# Patient Record
Sex: Male | Born: 1975 | Race: White | Hispanic: No | Marital: Married | Smoking: Never smoker
Health system: Southern US, Community
[De-identification: ages and names within clinical notes are randomized; demographics above are authoritative.]

## PROBLEM LIST (undated history)

## (undated) DIAGNOSIS — E039 Hypothyroidism, unspecified: Secondary | ICD-10-CM

## (undated) DIAGNOSIS — Z9483 Pancreas transplant status: Secondary | ICD-10-CM

## (undated) DIAGNOSIS — E079 Disorder of thyroid, unspecified: Secondary | ICD-10-CM

## (undated) DIAGNOSIS — K219 Gastro-esophageal reflux disease without esophagitis: Secondary | ICD-10-CM

## (undated) DIAGNOSIS — F32A Depression, unspecified: Secondary | ICD-10-CM

## (undated) DIAGNOSIS — Z940 Kidney transplant status: Secondary | ICD-10-CM

## (undated) DIAGNOSIS — F329 Major depressive disorder, single episode, unspecified: Secondary | ICD-10-CM

## (undated) DIAGNOSIS — F419 Anxiety disorder, unspecified: Secondary | ICD-10-CM

## (undated) HISTORY — PX: EYE SURGERY: SHX253

## (undated) HISTORY — DX: Depression, unspecified: F32.A

## (undated) HISTORY — DX: Anxiety disorder, unspecified: F41.9

## (undated) HISTORY — DX: Hypothyroidism, unspecified: E03.9

## (undated) HISTORY — DX: Gastro-esophageal reflux disease without esophagitis: K21.9

## (undated) HISTORY — DX: Major depressive disorder, single episode, unspecified: F32.9

---

## 1997-08-12 ENCOUNTER — Other Ambulatory Visit: Admission: RE | Admit: 1997-08-12 | Discharge: 1997-08-12 | Payer: Self-pay | Admitting: Nephrology

## 1997-09-12 ENCOUNTER — Ambulatory Visit: Admission: RE | Admit: 1997-09-12 | Discharge: 1997-09-12 | Payer: Self-pay | Admitting: Vascular Surgery

## 1997-09-18 ENCOUNTER — Other Ambulatory Visit: Admission: RE | Admit: 1997-09-18 | Discharge: 1997-09-18 | Payer: Self-pay | Admitting: Nephrology

## 1997-09-25 ENCOUNTER — Inpatient Hospital Stay (HOSPITAL_COMMUNITY): Admission: RE | Admit: 1997-09-25 | Discharge: 1997-09-29 | Payer: Self-pay | Admitting: Nephrology

## 1998-08-26 ENCOUNTER — Encounter: Admission: RE | Admit: 1998-08-26 | Discharge: 1998-11-23 | Payer: Self-pay | Admitting: Endocrinology

## 1998-09-06 ENCOUNTER — Inpatient Hospital Stay (HOSPITAL_COMMUNITY): Admission: AD | Admit: 1998-09-06 | Discharge: 1998-09-09 | Payer: Self-pay | Admitting: Nephrology

## 1998-09-13 ENCOUNTER — Emergency Department (HOSPITAL_COMMUNITY): Admission: EM | Admit: 1998-09-13 | Discharge: 1998-09-13 | Payer: Self-pay | Admitting: Emergency Medicine

## 1999-10-04 ENCOUNTER — Encounter: Admission: RE | Admit: 1999-10-04 | Discharge: 1999-10-04 | Payer: Self-pay | Admitting: Nephrology

## 1999-10-04 ENCOUNTER — Encounter: Payer: Self-pay | Admitting: Nephrology

## 2000-01-14 ENCOUNTER — Encounter: Payer: Self-pay | Admitting: Nephrology

## 2000-01-14 ENCOUNTER — Encounter: Admission: RE | Admit: 2000-01-14 | Discharge: 2000-01-14 | Payer: Self-pay | Admitting: Nephrology

## 2000-04-24 ENCOUNTER — Encounter: Payer: Self-pay | Admitting: Nephrology

## 2000-04-24 ENCOUNTER — Encounter: Admission: RE | Admit: 2000-04-24 | Discharge: 2000-04-24 | Payer: Self-pay | Admitting: Nephrology

## 2000-05-09 ENCOUNTER — Ambulatory Visit (HOSPITAL_COMMUNITY): Admission: RE | Admit: 2000-05-09 | Discharge: 2000-05-09 | Payer: Self-pay | Admitting: Urology

## 2000-06-22 ENCOUNTER — Encounter: Payer: Self-pay | Admitting: Nephrology

## 2000-06-22 ENCOUNTER — Encounter: Admission: RE | Admit: 2000-06-22 | Discharge: 2000-06-22 | Payer: Self-pay | Admitting: Nephrology

## 2000-07-01 ENCOUNTER — Encounter: Payer: Self-pay | Admitting: Nephrology

## 2000-07-01 ENCOUNTER — Emergency Department (HOSPITAL_COMMUNITY): Admission: EM | Admit: 2000-07-01 | Discharge: 2000-07-01 | Payer: Self-pay | Admitting: Emergency Medicine

## 2000-08-23 ENCOUNTER — Ambulatory Visit (HOSPITAL_COMMUNITY): Admission: RE | Admit: 2000-08-23 | Discharge: 2000-08-23 | Payer: Self-pay | Admitting: Gastroenterology

## 2000-08-23 ENCOUNTER — Encounter: Payer: Self-pay | Admitting: Gastroenterology

## 2000-08-23 ENCOUNTER — Encounter: Admission: RE | Admit: 2000-08-23 | Discharge: 2000-08-23 | Payer: Self-pay | Admitting: Gastroenterology

## 2000-08-24 ENCOUNTER — Encounter: Payer: Self-pay | Admitting: Gastroenterology

## 2000-08-24 ENCOUNTER — Ambulatory Visit (HOSPITAL_COMMUNITY): Admission: RE | Admit: 2000-08-24 | Discharge: 2000-08-24 | Payer: Self-pay | Admitting: Gastroenterology

## 2000-09-02 ENCOUNTER — Emergency Department (HOSPITAL_COMMUNITY): Admission: EM | Admit: 2000-09-02 | Discharge: 2000-09-03 | Payer: Self-pay | Admitting: Emergency Medicine

## 2000-09-19 ENCOUNTER — Encounter: Admission: RE | Admit: 2000-09-19 | Discharge: 2000-09-19 | Payer: Self-pay | Admitting: *Deleted

## 2000-09-19 ENCOUNTER — Ambulatory Visit (HOSPITAL_COMMUNITY): Admission: RE | Admit: 2000-09-19 | Discharge: 2000-09-19 | Payer: Self-pay | Admitting: *Deleted

## 2000-12-08 ENCOUNTER — Encounter: Payer: Self-pay | Admitting: Nephrology

## 2000-12-08 ENCOUNTER — Ambulatory Visit (HOSPITAL_COMMUNITY): Admission: RE | Admit: 2000-12-08 | Discharge: 2000-12-08 | Payer: Self-pay | Admitting: Nephrology

## 2001-11-19 ENCOUNTER — Encounter: Admission: RE | Admit: 2001-11-19 | Discharge: 2001-11-19 | Payer: Self-pay | Admitting: Nephrology

## 2001-11-19 ENCOUNTER — Encounter: Payer: Self-pay | Admitting: Nephrology

## 2002-03-07 ENCOUNTER — Inpatient Hospital Stay (HOSPITAL_COMMUNITY): Admission: EM | Admit: 2002-03-07 | Discharge: 2002-03-08 | Payer: Self-pay | Admitting: Emergency Medicine

## 2002-03-22 ENCOUNTER — Encounter: Payer: Self-pay | Admitting: Emergency Medicine

## 2002-03-22 ENCOUNTER — Emergency Department (HOSPITAL_COMMUNITY): Admission: EM | Admit: 2002-03-22 | Discharge: 2002-03-22 | Payer: Self-pay | Admitting: Emergency Medicine

## 2002-06-22 ENCOUNTER — Encounter: Payer: Self-pay | Admitting: Nephrology

## 2002-06-22 ENCOUNTER — Inpatient Hospital Stay (HOSPITAL_COMMUNITY): Admission: EM | Admit: 2002-06-22 | Discharge: 2002-06-23 | Payer: Self-pay | Admitting: Emergency Medicine

## 2002-07-02 ENCOUNTER — Encounter: Payer: Self-pay | Admitting: Nephrology

## 2002-07-02 ENCOUNTER — Encounter: Admission: RE | Admit: 2002-07-02 | Discharge: 2002-07-02 | Payer: Self-pay | Admitting: Nephrology

## 2003-09-17 ENCOUNTER — Ambulatory Visit (HOSPITAL_COMMUNITY): Admission: RE | Admit: 2003-09-17 | Discharge: 2003-09-17 | Payer: Self-pay | Admitting: Vascular Surgery

## 2004-03-30 ENCOUNTER — Encounter: Admission: RE | Admit: 2004-03-30 | Discharge: 2004-03-30 | Payer: Self-pay | Admitting: Nephrology

## 2004-08-12 ENCOUNTER — Encounter: Admission: RE | Admit: 2004-08-12 | Discharge: 2004-08-12 | Payer: Self-pay | Admitting: Nephrology

## 2004-08-18 ENCOUNTER — Ambulatory Visit: Payer: Self-pay

## 2005-04-04 ENCOUNTER — Inpatient Hospital Stay (HOSPITAL_COMMUNITY): Admission: EM | Admit: 2005-04-04 | Discharge: 2005-04-05 | Payer: Self-pay | Admitting: *Deleted

## 2006-06-22 ENCOUNTER — Encounter: Admission: RE | Admit: 2006-06-22 | Discharge: 2006-06-22 | Payer: Self-pay | Admitting: Nephrology

## 2007-06-05 ENCOUNTER — Ambulatory Visit (HOSPITAL_BASED_OUTPATIENT_CLINIC_OR_DEPARTMENT_OTHER): Admission: RE | Admit: 2007-06-05 | Discharge: 2007-06-05 | Payer: Self-pay | Admitting: Urology

## 2009-12-03 ENCOUNTER — Ambulatory Visit (HOSPITAL_COMMUNITY): Admission: RE | Admit: 2009-12-03 | Discharge: 2009-12-04 | Payer: Self-pay | Admitting: Surgery

## 2010-07-09 LAB — COMPREHENSIVE METABOLIC PANEL
Alkaline Phosphatase: 87 U/L (ref 39–117)
BUN: 16 mg/dL (ref 6–23)
CO2: 31 mEq/L (ref 19–32)
Chloride: 105 mEq/L (ref 96–112)
Creatinine, Ser: 1.2 mg/dL (ref 0.4–1.5)
GFR calc non Af Amer: >60 mL/min (ref >60)
Glucose, Bld: 82 mg/dL (ref 70–99)
Potassium: 4.1 mEq/L (ref 3.5–5.1)
Total Bilirubin: 0.6 mg/dL (ref 0.3–1.2)

## 2010-07-09 LAB — SURGICAL PCR SCREEN
MRSA, PCR: NEGATIVE
Staphylococcus aureus: POSITIVE — AB

## 2010-07-09 LAB — CBC
HCT: 45.2 % (ref 39.0–52.0)
MCH: 32.1 pg (ref 26.0–34.0)
MCV: 94 fL (ref 78.0–100.0)
RBC: 4.8 MIL/uL (ref 4.22–5.81)
WBC: 7.1 10*3/uL (ref 4.0–10.5)

## 2010-09-07 NOTE — Op Note (Signed)
NAME:  Daniel Hill, BAYUK               ACCOUNT NO.:  [id number]   MEDICAL RECORD NO.:  [id number]          PATIENT TYPE:  AMB   LOCATION:  N[facility]                         FACILITY:  [facility]   PHYSICIAN:  Marshall Cork. Jeffie Pollock, M.D.    DATE OF BIRTH:  [birth year]   DATE OF PROCEDURE:  06/05/2007  DATE OF DISCHARGE:                               OPERATIVE REPORT   PROCEDURE:  Left hydrocelectomy.   PREOPERATIVE DIAGNOSIS:  Left hydrocele.   POSTOPERATIVE DIAGNOSIS:  Left hydrocele.   SURGEON:  Dr. Irine Seal   ANESTHESIA:  General.   COMPLICATIONS:  None.   INDICATIONS:  [name] is a 35 year old white male with a symptomatic left  hydrocele who is to undergo hydrocelectomy.   FINDINGS AND PROCEDURE:  He was given a gram of Ancef because of his  history of renal and pancreatic transplant.  He was placed in supine  position.  General anesthetic was induced.  His scrotum was clipped.  He  was prepped with Betadine solution, draped in the usual sterile fashion.  Oblique scrotal incision was made over the hydrocele on the left.  The  hydrocele sac was delivered from the scrotum.  It was opened and  drained.  The sac was then imbricated behind the testicle in a water  bottle fashion using running locked 3-0 chromic suture.  The testicle  was then returned to the left hemi scrotum once hemostasis was assured.  The dartos was closed using a running 3-0 chromic, skin using a running  vertical mattress 3-0 chromic.  The incision was infiltrated 3 mL 0.25%  Marcaine.  A dressing of 4x4s, fluff Kerlix and scrotal support was  placed.  His anesthetic was reversed.  He was moved to the recovery room  in stable condition and there were no complications.      Marshall Cork. Jeffie Pollock, M.D.  Electronically Signed     JJW/MEDQ  D:  06/05/2007  T:  06/06/2007  Job:  [id number]   cc:   Jeneen Rinks L. Deterding, M.D.  Fax: [phone]

## 2010-09-10 NOTE — H&P (Signed)
NAME:  Daniel Hill, MARCHETTA NO.:  [id number]   MEDICAL RECORD NO.:  [id number]          PATIENT TYPE:  EMS   LOCATION:  MAJO                         FACILITY:  [facility]   PHYSICIAN:  James L. Deterding, M.D.DATE OF BIRTH:  Sep 13, 1975   DATE OF ADMISSION:  04/04/2005  DATE OF DISCHARGE:                                HISTORY & PHYSICAL   ADMISSION DIAGNOSIS:  Refractory nausea and vomiting.   HISTORY OF PRESENT ILLNESS:  This is a 35 year old gentleman followed by me  longterm for type 1 diabetes which has caused end-stage renal disease.  He  received a kidney-pancreas transplant in 2001 at [facility].  He has done quite  well from that.  He has had minor complications off and on with  hyperlipidemia, hypothyroidism, and upper respiratory infections, but no  serious complications.  Prior to his kidney transplant, he had had bilateral  nephrectomies for recurrent infections.  He had an upper respiratory  infection on March 29, 2005 and still has some, and he says it is a sinus  infection and is getting better with Claritin.  He had some nausea,  vomiting, and cough on March 30, 2005 but resolved after approximately 12  hours.  He had sudden onset of nausea and vomiting earlier this a.m. and  could not stop vomiting.  He had one loose stool this a.m. also.  He says he  ate fairly heavily last p.m. and cannot say that he may not have had food  poisoning.  He has not had fevers or chills but does shake he says.  He has  had no belly pain or cramping other than the vomiting.  This is a new event  for him this week.  He has had no medication intolerances or other problems  with that at the current time.   PAST MEDICAL HISTORY:  As listed above.   MEDICATIONS:  1.  Prograf 3 mg b.i.d.  2.  __________ 75 mg daily.  3.  Prednisone 5 mg daily.  4.  Synthroid 0.5 mg daily.  5.  Multivitamins once a day.  6.  Phenergan.  7.  Claritin.   ALLERGIES:  NO KNOWN DRUG ALLERGIES.   PAST SURGICAL HISTORY:  1.  Nephrectomies.  2.  Fistula which was taken down several years ago.  3.  Kidney-pancreas transplant.  4.  Multiple eye surgeries, and he is blind in his left eye.   REVIEW OF SYSTEMS:  HEENT:  He is blind in his left eye.  He has some  vitreous areas.  He has had laser surgery on the right and a vitrectomy on  that side.  CARDIOVASCULAR:  He has not been exercising.  He has not gained weight.  He  sleeps on one pillow.  No PND or orthopnea.  He denies ankle edema.  PULMONARY:  He has had a cough.  No sputum production.  Specifically, no  nasal drainage recently.  GI:  As listed above.  SKIN:  Negative.  MUSCULOSKELETAL:  Various aches and pains but nothing consistent.  NEUROLOGIC:  Other than his vision, no specific problems.  FAMILY HISTORY:  Noncontributory to the current illness.  He does have  family history of diabetes and high blood pressure.   SOCIAL HISTORY:  He lives in an apartment by himself.  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   PHYSICAL EXAMINATION:  GENERAL:  He is pale and obviously has been having  some nausea.  He is very sleepy from the medicine he has been given.  He is  in no acute distress.  VITAL SIGNS:  Blood pressure 148/68 supine, with a heart rate of 21; 124/71  standing, with a heart rate of 144.  HEENT:  Patent cornea and vitreous on the left.  Pharynx unremarkable.  Diabetic retinopathy on the right eye.  NECK:  He has shotty anterior and posterior cervical adenopathy in the neck.  No thyromegaly.  LUNGS:  Normal percussion, normal expansion, normal breath sounds.  CARDIOVASCULAR:  Regular rhythm.  S4, grade 2/6 systolic ejection murmur  ____________ fifth intercostal space.  EXTREMITIES:  No significant edema.  Pulses are 2+ and 4+.  No bruits.  ___________ thrills.  ABDOMEN:  Positive bowel sounds.  Soft, nontender.  No organomegaly.  He has  a transplant in the left lower quadrant large midline scar.   SKIN:  Some acne.  MUSCULOSKELETAL:  Unremarkable.  NEUROLOGIC:  Unremarkable.   IMAGING STUDIES:  X-rays reveal a nonspecific bowel-gas pattern.  Chest x-  ray unremarkable.   LABORATORY DATA:  Hemoglobin 15, white count 11,500, platelets 343,000.  Sodium 142, potassium 3.8, chloride 110, bicarb 29, creatinine 1.3, BUN 21,  glucose 111.  LFTs normal.  Bilirubin normal.  Lipase 21.   ASSESSMENT:  1.  Nausea, vomiting, either gastroenteritis versus food poisoning.  Mild      decrease in volume at this time.  Will treat his symptoms and volume at      this time and make sure he holds his medications down for this      transplant.  That is the biggest issue for him.  2.  Kidney-pancreas.  Give his medications.  __________ have to give him an      intravenously.  3.  Hypothyroidism.  Give his medication intravenously if he does not      tolerate it by mouth.  4.  Hyperlipidemia.  5.  Visual difficulty.   PLAN:  1.  IV fluids.  2.  Antisuppresives.  3.  Anti-nausea medicines.           ______________________________  Joyice Faster Deterding, M.D.     JLD/MEDQ  D:  04/04/2005  T:  04/04/2005  Job:  [id number]

## 2010-09-10 NOTE — Procedures (Signed)
[facility]  Patient:    Daniel Hill, Daniel Hill                      MRN: [id number] Proc. Date: 08/23/00 Adm. Date:  [id number] Attending:  Sherrin Daisy CC:         Joyice Faster. Deterding, M.D.   Procedure Report  PROCEDURE:  Esophagogastroduodenoscopy.  MEDICATIONS:  Hurricaine spray, fentanyl 62.5 mcg, Versed 6 mg IV.  INDICATIONS:  Persistent nausea and vomiting.  Ultrasound of the gallbladder done just before this procedure revealed no evidence of gallstones.  This is a man with end-stage renal disease on dialysis.  Specifically he is on ambulatory peritoneal dialysis.  INFORMED CONSENT:  The procedure had been explained to the patient and consent obtained.  DESCRIPTION OF PROCEDURE:  With the patient in the left lateral decubitus position, the Olympus video endoscope was inserted blindly into the esophagus into agglutination.  The distal esophagus was reached.  The distal esophagus was markedly inflamed with marked ulceration.  The stomach was entered, pylorus identified and passed.  Duodenum including the bulb and second portion seen well and were unremarkable.  The pyloric channel was normal.  There was no evidence of obstruction.  The antrum and body were normal.  Fundus and cardia seen well on retroflexed view and were normal.  There was a hiatal hernia.  The GE junction was somewhat patent.  Again, the distal esophagus was markedly friable and reddened with possibly some small ulcerations.  The proximal esophagus was normal.  This had the appearance of fairly significant reflux esophagitis.  The scope was withdrawn, and the patient tolerated the procedure well, maintained on low-flow oxygen and pulse oximetry throughout the procedure with no obvious problem.  ASSESSMENT:  Marked reflux esophagitis.  PLAN:  Will continue on Nexium, but increase it to b.i.d. and continue the Reglan.  We will schedule a gastric emptying scan and see back in  the office in 3-4 weeks. DD:  08/23/00 TD:  08/23/00 Job: [id number]

## 2010-09-10 NOTE — Discharge Summary (Signed)
NAME:  Daniel Hill, Daniel Hill                         ACCOUNT NO.:  [id number]   MEDICAL RECORD NO.:  [id number]                   PATIENT TYPE:  INP   LOCATION:  5509                                 FACILITY:  [facility]   PHYSICIAN:  Ron Parker, M.D.             DATE OF BIRTH:  1975-07-30   DATE OF ADMISSION:  03/06/2002  DATE OF DISCHARGE:  03/08/2002                                 DISCHARGE SUMMARY   DISCHARGE DIAGNOSES:  1. Upper respiratory infection.  2. Gastroesophageal reflux disease.  3. Hypothyroidism.  4. Diabetes type 1.  5. Status post kidney and pancreas transplant.   DISCHARGE MEDICATIONS:  1. Prograf.  2. Synthroid 50 mcg p.o. q.d.  3. Nexium 40 mg p.o. q.d.  4. Prednisone 5 mg p.o. q.d.  5. Aspirin 325 mg p.o. q.d.  6. Azathioprine 75 mg p.o. q.d.  7. Tequin 400 mg p.o. q.d. for five days to finish a seven-day regimen of     antibiotic therapy.   PROCEDURE:  Chest x-ray done 03/07/02 showed impression for cardiomegaly and  questionable mild interstitial edema. No infiltrates were seen.   FOLLOW UP:  Followup date is with Dr. Jimmy Footman as already previously  scheduled to be on 03/28/02.   HISTORY OF PRESENT ILLNESS:  This is a 35 year old white male, well-  developed, well-nourished, with diabetes and other medical issues who came  in presenting with 24 hours of sore throat, stuffy head, nausea without  vomiting, and low-grade temperature. He denies diarrhea, hematochezia, and  cough. He also denies abdominal pain, chest pain, and shortness of breath.  His other complaint is that he has had some fatigue and malaise without  joint achiness. No sick contacts. No travel history.   ALLERGIES:  Codeine and Reglan.   PAST MEDICAL HISTORY:  Per discharge diagnoses.   SOCIAL HISTORY:  He lives in [place] with mother. Denies tobacco,  alcohol, IV drug use.   REVIEW OF SYMPTOMS:  No change in appetite and has been keeping p.o. intake  well and has no rashes or  headaches associated with this.   PHYSICAL EXAMINATION:  VITAL SIGNS:  Temperature 99.1, blood pressure  126/61, heart rate 117, respirations 18, and 97% on room air saturations.  GENERAL:  Somnolent, well-developed, well-nourished, white male in no acute  distress.  HEENT:  Uncooperative during initial exam. Later found to have some erythema  in the posterior oropharynx. No exudate or tonsil hypertrophy noted. He is  negative for lymphadenopathy.  CARDIOVASCULAR:  Regular rate with tachycardic rate, regular rhythm. No  murmurs, rubs, or gallops.  RESPIRATORY:  Lungs are clear to auscultation. Good breath sounds. No  wheezing present.  ABDOMEN:  Soft, nontender, nondistended, good bowel sounds.  EXTREMITIES:  No edema. There are warm, dry, and have good pulses upper and  lower extremities  2+.   LABORATORY DATA:  Initial potassium was at 6.7; however, repeat was shown to  be 4.2 with a sodium of 135, chloride 105, bicarb 27, BUN of 23, creatinine  of 1.3, glucose 113, calcium 8.9.   White count of 6.8, hemoglobin of 14, hematocrit of 40.7, and platelets of  445. His lipase and amylase levels were both normal, and he had a negative  UA.   HOSPITAL COURSE:  1. Sore throat, low-grade fever, sinus congestion, most likely upper     respiratory infection; however, he was treated with conservative     management secondary to immunosuppressants with the worries of possible     subacute bacterial etiology. He was given two 1 g doses of Rocephin, and     he will be switched over to p.o. Tequin for a total antibiotic treatment     of seven days. He has been given prescriptions, and on day of discharge,     he denies any nausea, is hungry, and he has defervesced. He continues to     deny any chest pain or shortness of breath. His lung exam remained clear     without any decline.  2. Other medical issues remained stable throughout his hospital course, and     I have not changed any  medications, and he will return and resume on his     home medicines with which he was admitted with.   DISPOSITION:  Doing well, is stable, and will continue followup with Dr.  Jimmy Footman who is primary renal physician.                                               Ron Parker, M.D.    JD/MEDQ  D:  03/08/2002  T:  03/08/2002  Job:  [id number]

## 2010-09-10 NOTE — Op Note (Signed)
[facility]  Patient:    Daniel Hill, Daniel Hill                      MRN: [id number] Proc. Date: 05/09/00 Adm. Date:  [id number] Attending:  Hortencia Pilar CC:         Joyice Faster. Deterding, M.D.   Operative Report  PROCEDURE PERFORMED:  Cystoscopy and bilateral retrograde pyelograms with interpretation.  ENDOSCOPIST:  Marshall Cork. Jeffie Pollock, M.D.  PREOPERATIVE DIAGNOSIS:  End-stage renal disease with recurrent urinary tract infections.  POSTOPERATIVE DIAGNOSIS:  End-stage renal disease with recurrent urinary tract infections.  ANESTHESIA:  General.  COMPLICATIONS:  None.  INDICATIONS:  [name] is a 35 year old white male with a long history of end-stage renal disease and diabetes, who was on the transplant list, but was found to have a urinary tract infection, which has recurred after a couple rounds of antibiotics.  It was felt that cystoscopy and retrograde pyelogram was indicated to complete his evaluation for the infection.  He had renal ultrasounds in the office, which revealed small kidneys, but no hydro or stones.  A culture in the office was negative, although there were too numerous to count white cells.  DESCRIPTION OF PROCEDURE:  The patient was taken to the operating room.  He had been on Cipro preoperatively.  A general anesthetic was induced.  He was placed in the lithotomy position.  His perineum and genitalia were prepped with Betadine solution and draped in the usual sterile fashion.  Cystoscopy was performed using the 22-French scope as well as the 70 degree lenses. Examination revealed a normal urethra.  The prostatic urethra was short without obstruction.  The bladder wall was smooth and pale without tumor, stones or inflammation.  The ureteral orifices were in their normal anatomic position.  The right ureteral orifice was somewhat gapping.  The right orifice was cannulated with a 5-French open ended catheter.  Contrast was instilled in a  retrograde fashion.  The contrast demonstrated a normal caliber ureter to the UPJ and the internal collecting system was delicate without filling defects or hydronephrosis.  The left system was then examined in a similar fashion once again with the findings of a delicate ureter and an intrarenal collecting system without hydronephrosis or filling defects.  Both systems seemed to drain appropriately.  After completion of the procedure, the bladder was drained, the scope was removed.  The patient was taken down from the lithotomy position.  His anesthetic was reversed.  He was moved to the recovery room in stable condition.  He will be kept on Cipro 250 mg every other day for the next three months and also he will be given Vicodin for pain. DD:  05/09/00 TD:  05/09/00 Job: [id number]

## 2010-09-10 NOTE — Discharge Summary (Signed)
Daniel Hill, Daniel Hill               ACCOUNT NO.:  [id number]   MEDICAL RECORD NO.:  [id number]          PATIENT TYPE:  INP   LOCATION:  5527                         FACILITY:  [facility]   PHYSICIAN:  Daniel Hill, M.D.DATE OF BIRTH:  1975-09-20   DATE OF ADMISSION:  04/04/2005  DATE OF DISCHARGE:  04/05/2005                           DISCHARGE SUMMARY - REFERRING   DISCHARGE DIAGNOSES:  1.  Gastroenteritis versus food poisoning with refractory nausea and      vomiting/resolved.  2.  Status post kidney-pancreas transplant.  3.  Bronchitis.   HISTORY OF PRESENT ILLNESS:  Daniel Hill is a 35 year old gentleman who is  normally followed by Daniel Hill, M.D., for type 1 diabetes and  status post kidney-pancreas transplant in 2001 at [facility].  He has done quite  well from that.  He had some minor complications on and off with  hyperlipidemia, hypothyroidism, and upper respiratory infections but no  serious complications.  Prior to this kidney transplant, he had bilateral  nephrectomies for recurrent infection.  He had an upper respiratory  infection on March 29, 2005, and still had some, and says he has a sinus  infection and is getting better with Claritin.  He had some nausea and  vomiting and cough on March 30, 2005, but resolved after approximately 12  hours.  He had a sudden onset of nausea and vomiting earlier this a.m. and  could not stop vomiting.  He had one loose stool this a.m. also.  He says he  ate fairly heavily last p.m. and cannot say that he may not have food  poisoning.  He has not had fevers or chills but does shake, he says.  He has  no belly pain or cramping other than vomiting.  This is a  new event for him  this week.  He has had no medication intolerances or other problems with  that at the current time.   ADMISSION LABORATORY DATA:  Hemoglobin 15, white count 11,500, platelets  343,000.  Sodium 142, potassium 3.8, chloride 110, bicarb 29, creatinine  1.3, BUN 21, glucose 111.  LFTs are normal.  Lipase is 21.   HOSPITAL COURSE:  PROBLEM #1 -  GASTROENTERITIS VERSUS FOOD POISONING WITH  REFRACTOR NAUSEA AND VOMITING NOW RESOLVED ON DISCHARGE:  Patient was  admitted and dehydrated with IV fluids and antiemetics.  His diet was  advanced very slowly.  With this regimen, patient had resolvement of his  nausea and vomiting within 24 hours.  There was no further intervention.  Of  note, patient had an acute abdominal series on April 04, 2005, which  showed no active cardiopulmonary disease and a nonobstructive bowel gas  pattern without free enteroperitoneal air.   PROBLEM #2 -  KIDNEY-PANCREAS TRANSPLANT:  Patient was given his normal p.o.  immunosuppressive medications which he tolerated well.  There was no further  intervention.  His creatinine remained stable this admission.   PROBLEM #3 -  PRODUCTIVE COUGH:  A repeat x-ray was performed on date of  discharge April 05, 2005, which was suspicious for a bronchitis type  changes.  Given patient's history of cough, he was placed on amoxicillin 500  mg p.o. t.i.d. and which he will continue with at home.   DISCHARGE MEDICATIONS:  1.  Prograf 3 mg b.i.d.  2.  Imuran 75 mg daily.  3.  Prednisone 5 mg daily.  4.  Levothyroxine 50 mcg daily.  5.  Amoxicillin 500 mg p.o. t.i.d. x10 days.   DISCHARGE INSTRUCTIONS:  Patient is discharged with a prescription for his  amoxicillin.  He will increase his activity slowly.  He is instructed to  stay hydrated.  He will follow up with Daniel Hill, M.D., on his  usual scheduled appointment.  He is instructed to call our office with any  questions or concerns.      Daniel Kindle, PA    ______________________________  Daniel Hill, M.D.    MY/MEDQ  D:  04/05/2005  T:  04/05/2005  Job:  [id number]

## 2010-09-10 NOTE — Op Note (Signed)
NAME:  Daniel Hill, WALQUIST                         ACCOUNT NO.:  [id number]   MEDICAL RECORD NO.:  [id number]                   PATIENT TYPE:  OIB   LOCATION:  2899                                 FACILITY:  [facility]   PHYSICIAN:  Nelda Severe. Kellie Simmering, M.D.               DATE OF BIRTH:  [birth year]   DATE OF PROCEDURE:  09/17/2003  DATE OF DISCHARGE:  09/17/2003                                 OPERATIVE REPORT   PREOPERATIVE DIAGNOSIS:  Ischemic ulcers of the left hand secondary to steal  from left upper arm arteriovenous fistula plus venous hypertension with  edema of the left arm.   POSTOPERATIVE DIAGNOSIS:  Ischemic ulcers of the left hand secondary to  steal from left upper arm arteriovenous fistula plus venous hypertension  with edema of the left arm.   OPERATION:  Ligation of left upper arm arteriovenous fistula.   SURGEON:  Nelda Severe. Kellie Simmering, M.D.   ANESTHESIA:  Local.   ASSISTANT:  Nurse.   COMPLICATIONS:  None.   PROCEDURE:  The patient was taken to the [facility] operating room and  placed in the supine position at which time the left upper extremity was  prepped with Betadine scrubbing solution and draped in routine sterile  manner.  After infiltration of 1% Xylocaine, a transverse incision was made  through the previous scar in the antecubital area.  There was an upper arm  AV fistula between the cephalic vein and brachial artery which was dissected  free.  The vein was quite large and there was excellent flow and a palpable  thrill in the fistula.  The vein was dissected free down to the anastomosis  of the brachial artery and was ligated with two #1 silk ties, being careful  not to decrease flow down the brachial artery.  The Doppler was used to  verify good radial arterial flow after ligating the fistula.  When this was  completed, the veins were decompressed and there was a 2+ pulse palpable on  the left radial artery.  The wound was closed with Vicryl in subcuticular  fashion.  A sterile dressing was applied.  The patient was taken to the  recovery room in satisfactory condition.                                               Nelda Severe Kellie Simmering, M.D.    JDL/MEDQ  D:  09/17/2003  T:  09/17/2003  Job:  [id number]

## 2010-09-10 NOTE — Discharge Summary (Signed)
NAME:  Daniel Hill, Daniel Hill NO.:  [id number]   MEDICAL RECORD NO.:  [id number]                   PATIENT TYPE:  INP   LOCATION:                                       FACILITY:  [facility]   PHYSICIAN:  Sharlet Salina, M.D.                DATE OF BIRTH:  1975/10/02   DATE OF ADMISSION:  06/22/2001  DATE OF DISCHARGE:  06/23/2002                                 DISCHARGE SUMMARY   CHIEF COMPLAINT:  Nausea and vomiting and diarrhea.   DISCHARGE DIAGNOSES:  1. Nausea and vomiting and diarrhea.  2. Status post kidney transplant in January 2003, 13 months out.  3. 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  4. History of type I diabetes mellitus that lead to the above transplants.  5. History of hypothyroidism.  6. History of gastroesophageal reflux disease, status post EGD with marked     esophagitis.  7. History of upper respiratory infection, admitted March 05, 2002 until     March 08, 2002.  8. Vision problems secondary to diabetic retinopathy, status post five     surgeries.  9. Knee pain secondary to fall today with x-ray of the knees negative.   PRIMARY CARE PHYSICIAN:  James L. Deterding, M.D.   DISCHARGE ACTIVITIES:  1. Prograf 1 mg three in the morning and two in the evening.  2. Prednisone 5 mg by mouth each day.  3. Aspirin 81 mg by mouth each day.  4. Nexium 40 mg one by mouth twice a day.  5. Synthroid 50 mcg one each day.  6. Oxodipine 75 mg each day.  7. Mupirocin 0.2% ointment, apply to the open sore daily. Prescribed by     Dermatology.  8. Acular eye drops with one drop to the left eye 1-2 times per day.   FOLLOW UP:  The patient is to follow-up with Dr. Jimmy Footman on Wednesday,  June 26, 2002.   PROCEDURE:  None.   CONSULTATIONS:  None.   HISTORY OF PRESENT ILLNESS:  Mr. Maury Dus is a 35 year old white male with past  medical history significant for type I diabetes mellitus, resulting in  pancreatic plus renal transplant  at [facility] 13 months  ago. He presented to the emergency room with nausea and vomiting and  diarrhea times two days with mild abdominal pain with some shortness of  breath and dizziness. He had no fever. He did complain of some weakness and  dry mouth. He also complained of dry heaving that morning after breakfast.  No dysuria, cough or chest pain. His abdominal pain increases with food. He  was around his niece one week ago who had the same complaint of nausea and  vomiting and diarrhea. He is also taking Cipro for 10 days. He is on day  seven. This was given to him by his Dermatologist for ulcer on his middle  finger.  PAST MEDICAL HISTORY:  As per discharge problem list.   FAMILY HISTORY:  Mother with hypertension and thyroid disorder. She is 52  years of age. Father is 1 years of age with coronary artery disease. h   SOCIAL HISTORY:  No tobacco, IV drugs, or alcohol use. He lives in [place] with his mother. He is on disability.   ADMISSION MEDICATIONS:  Same as discharge medications except Cipro was  discontinued.   ALLERGIES:  CODEINE (nausea), REGLAN (dizzy), TEQUIN (hypoglycemia).   REVIEW OF SYSTEMS:  Positive for chills, fatigue, intentional weight loss,  shortness of breath, abdominal pain, nausea and vomiting and diarrhea and  dizziness.   PHYSICAL EXAMINATION:  VITAL SIGNS: Temperature 98.4, blood pressure 126/72,  pulse 93, respiratory rate 18, pulse ox 96% on room air.  HEENT: Normocephalic, atraumatic. Pupils are equal, round, and reactive to  light and accommodation. Left pupil was cloudy. He also had blue contact  lenses in.  CARDIAC: Regular rate and rhythm. A I/VI systolic murmur. No jugular venous  distention. No carotid bruit.  LUNGS: Clear to auscultation and percussion bilaterally. No wheezes, rhonchi  or rales.  ABDOMEN: Soft and mildly tender with positive bowel sounds. No guarding or  rebound. Positive for scars.   EXTREMITIES: No edema with 2+ PT pulses bilaterally.  NEURO: Good affect. Good strength 5/5 throughout.   HOSPITAL COURSE:  1. Nausea and vomiting and diarrhea. Differential diagnosis was infectious C-     diff versus viral versus gallstones versus pancreatitis versus just     reflux versus mediation reaction. His chest x-ray was clear which made     upper respiratory infection unlikely. His liver function studies were     elevated with some leukocytosis with left shift which made infection a     possibility. He had an abdominal ultrasound done which did not show any     gallstones. Since he was on antibiotic for seven days, C-diff was also a     possibility. During his hospital course, his liver function studies     trended down. His leukocytosis resolved although this could have been     secondary to his steroid use. Cultures were done which were pending on     discharge. Stool for C-diff and O&P was also pending. His Cipro was     discontinued because he stated that he was having nausea more so after     taking Cipro. His liver function studies could be elevated secondary to     the Cipro as chloroquinolinols are known to sometimes cause a bump in     liver function studies.  2. Gastroesophageal reflux disease. He was continued on his Nexium.  3. Hypothyroidism. He was continued on Synthroid.  4. Status post transplant. He was continued on his transplant medications.  5. Retinopathy. He was continued on his eye drops.  6. Ulcer of the finger. He had received seven days of Cipro. The Cipro was     discontinued since he could have possibly been having a reaction to that.   DIAGNOSTIC IMPRESSION:  Abdominal ultrasound negative. Knee x-ray negative.    LABORATORY DATA:  CBC revealed WBC of 7.4, hemoglobin 13.3, MCV 95,  platelets 353,000, sodium 141, potassium 4.2, chloride 114, CO2 23, glucose 88, BUN 11, creatinine 1.1, total bilirubin 0.9, alkaline phosphatase 167,  SGOT 64, SGPT  31, total protein 5.4, albumin 3.2, calcium 8.9, amylase 91,  lipase 24.  Sharlet Salina, M.D.    [place]/MEDQ  D:  06/23/2002  T:  06/23/2002  Job:  [id number]   cc:   Jeneen Rinks L. Deterding, M.D.  [place]  [place] [address]  Fax: [phone]

## 2011-01-14 LAB — POCT I-STAT 4, (NA,K, GLUC, HGB,HCT)
Glucose, Bld: 83
Operator id: 280881

## 2011-05-18 ENCOUNTER — Inpatient Hospital Stay (HOSPITAL_COMMUNITY)
Admission: EM | Admit: 2011-05-18 | Discharge: 2011-05-21 | DRG: 638 | Disposition: A | Discharge status: Home / Self Care | Payer: PRIVATE HEALTH INSURANCE | Source: Ambulatory Visit | Attending: Internal Medicine | Admitting: Internal Medicine

## 2011-05-18 ENCOUNTER — Encounter (HOSPITAL_COMMUNITY): Payer: Self-pay | Admitting: *Deleted

## 2011-05-18 ENCOUNTER — Other Ambulatory Visit: Payer: Self-pay

## 2011-05-18 DIAGNOSIS — Z940 Kidney transplant status: Secondary | ICD-10-CM

## 2011-05-18 DIAGNOSIS — E669 Obesity, unspecified: Secondary | ICD-10-CM | POA: Diagnosis present

## 2011-05-18 DIAGNOSIS — R079 Chest pain, unspecified: Secondary | ICD-10-CM | POA: Diagnosis present

## 2011-05-18 DIAGNOSIS — T86890 Other transplanted tissue rejection: Secondary | ICD-10-CM | POA: Diagnosis present

## 2011-05-18 DIAGNOSIS — E039 Hypothyroidism, unspecified: Secondary | ICD-10-CM | POA: Diagnosis present

## 2011-05-18 DIAGNOSIS — R109 Unspecified abdominal pain: Secondary | ICD-10-CM | POA: Diagnosis present

## 2011-05-18 DIAGNOSIS — IMO0002 Reserved for concepts with insufficient information to code with codable children: Secondary | ICD-10-CM

## 2011-05-18 DIAGNOSIS — Y830 Surgical operation with transplant of whole organ as the cause of abnormal reaction of the patient, or of later complication, without mention of misadventure at the time of the procedure: Secondary | ICD-10-CM | POA: Diagnosis present

## 2011-05-18 DIAGNOSIS — E860 Dehydration: Secondary | ICD-10-CM | POA: Diagnosis present

## 2011-05-18 DIAGNOSIS — R739 Hyperglycemia, unspecified: Secondary | ICD-10-CM

## 2011-05-18 DIAGNOSIS — E1010 Type 1 diabetes mellitus with ketoacidosis without coma: Principal | ICD-10-CM | POA: Diagnosis present

## 2011-05-18 DIAGNOSIS — E1110 Type 2 diabetes mellitus with ketoacidosis without coma: Secondary | ICD-10-CM | POA: Diagnosis present

## 2011-05-18 DIAGNOSIS — Z79899 Other long term (current) drug therapy: Secondary | ICD-10-CM

## 2011-05-18 DIAGNOSIS — R51 Headache: Secondary | ICD-10-CM | POA: Diagnosis not present

## 2011-05-18 DIAGNOSIS — T86899 Unspecified complication of other transplanted tissue: Secondary | ICD-10-CM | POA: Diagnosis present

## 2011-05-18 HISTORY — DX: Pancreas transplant status: Z94.83

## 2011-05-18 HISTORY — DX: Kidney transplant status: Z94.0

## 2011-05-18 HISTORY — DX: Disorder of thyroid, unspecified: E07.9

## 2011-05-18 LAB — GLUCOSE, CAPILLARY: Glucose-Capillary: >600 mg/dL (ref 70–99)

## 2011-05-18 NOTE — ED Notes (Signed)
Pt is a kidney and panaceas transplant in 2003.  States that he has been drinking a lot of water over the past several days, urinating multiple times in a hour.  Pt has cramping pain in his chest, is taking medications for URI.  Reports that he feels that he is in DKA-has had it previously.  A/O x 4, ambulatory.

## 2011-05-19 ENCOUNTER — Encounter (HOSPITAL_COMMUNITY): Payer: Self-pay | Admitting: Internal Medicine

## 2011-05-19 ENCOUNTER — Emergency Department (HOSPITAL_COMMUNITY): Payer: PRIVATE HEALTH INSURANCE

## 2011-05-19 DIAGNOSIS — R079 Chest pain, unspecified: Secondary | ICD-10-CM | POA: Diagnosis present

## 2011-05-19 DIAGNOSIS — E1110 Type 2 diabetes mellitus with ketoacidosis without coma: Secondary | ICD-10-CM | POA: Diagnosis present

## 2011-05-19 LAB — BASIC METABOLIC PANEL
BUN: 21 mg/dL (ref 6–23)
BUN: 20 mg/dL (ref 6–23)
BUN: 20 mg/dL (ref 6–23)
CO2: 23 mEq/L (ref 19–32)
CO2: 24 mEq/L (ref 19–32)
CO2: 21 mEq/L (ref 19–32)
CO2: 22 mEq/L (ref 19–32)
Calcium: 10.2 mg/dL (ref 8.4–10.5)
Calcium: 9.5 mg/dL (ref 8.4–10.5)
Calcium: 8.8 mg/dL (ref 8.4–10.5)
Calcium: 9.3 mg/dL (ref 8.4–10.5)
Chloride: 108 mEq/L (ref 96–112)
Creatinine, Ser: 0.84 mg/dL (ref 0.50–1.35)
Creatinine, Ser: 0.87 mg/dL (ref 0.50–1.35)
Creatinine, Ser: 0.83 mg/dL (ref 0.50–1.35)
GFR calc non Af Amer: >90 mL/min (ref >90)
GFR calc non Af Amer: >90 mL/min (ref >90)
Glucose, Bld: 113 mg/dL — ABNORMAL HIGH (ref 70–99)
Glucose, Bld: 125 mg/dL — ABNORMAL HIGH (ref 70–99)
Glucose, Bld: 507 mg/dL — ABNORMAL HIGH (ref 70–99)
Sodium: 138 mEq/L (ref 135–145)
Sodium: 134 mEq/L — ABNORMAL LOW (ref 135–145)
Sodium: 133 mEq/L — ABNORMAL LOW (ref 135–145)

## 2011-05-19 LAB — POCT I-STAT 3, VENOUS BLOOD GAS (G3P V)
Acid-base deficit: 5 mmol/L — ABNORMAL HIGH (ref 0.0–2.0)
Bicarbonate: 20.5 meq/L (ref 20.0–24.0)
O2 Saturation: 67 %
TCO2: 22 mmol/L (ref 0–100)
pCO2, Ven: 39.4 mmHg — ABNORMAL LOW (ref 45.0–50.0)
pH, Ven: 7.325 — ABNORMAL HIGH (ref 7.250–7.300)
pO2, Ven: 37 mmHg (ref 30.0–45.0)

## 2011-05-19 LAB — CBC
HCT: 40.1 % (ref 39.0–52.0)
HCT: 38.2 % — ABNORMAL LOW (ref 39.0–52.0)
Hemoglobin: 14.9 g/dL (ref 13.0–17.0)
Hemoglobin: 14.4 g/dL (ref 13.0–17.0)
Hemoglobin: 13.1 g/dL (ref 13.0–17.0)
MCH: 31.6 pg (ref 26.0–34.0)
MCH: 30.5 pg (ref 26.0–34.0)
MCHC: 35.1 g/dL (ref 30.0–36.0)
MCHC: 35.9 g/dL (ref 30.0–36.0)
MCHC: 34.3 g/dL (ref 30.0–36.0)
MCV: 88.1 fL (ref 78.0–100.0)
MCV: 88.8 fL (ref 78.0–100.0)
Platelets: 410 K/uL — ABNORMAL HIGH (ref 150–400)
Platelets: 403 K/uL — ABNORMAL HIGH (ref 150–400)
RBC: 4.55 MIL/uL (ref 4.22–5.81)
RBC: 4.3 MIL/uL (ref 4.22–5.81)
RDW: 12.1 % (ref 11.5–15.5)
RDW: 12.3 % (ref 11.5–15.5)
WBC: 17.8 10*3/uL — ABNORMAL HIGH (ref 4.0–10.5)
WBC: 16.6 K/uL — ABNORMAL HIGH (ref 4.0–10.5)
WBC: 14.4 K/uL — ABNORMAL HIGH (ref 4.0–10.5)

## 2011-05-19 LAB — DIFFERENTIAL
Basophils Absolute: 0.1 10*3/uL (ref 0.0–0.1)
Basophils Relative: 0 % (ref 0–1)
Lymphocytes Relative: 18 % (ref 12–46)
Monocytes Relative: 7 % (ref 3–12)
Neutro Abs: 13.4 10*3/uL — ABNORMAL HIGH (ref 1.7–7.7)
Neutrophils Relative %: 75 % (ref 43–77)

## 2011-05-19 LAB — CARDIAC PANEL(CRET KIN+CKTOT+MB+TROPI)
CK, MB: 1.5 ng/mL (ref 0.3–4.0)
Relative Index: INVALID (ref 0.0–2.5)
Total CK: 71 U/L (ref 7–232)
Total CK: 50 U/L (ref 7–232)
Troponin I: <0.3 ng/mL

## 2011-05-19 LAB — POCT I-STAT, CHEM 8
BUN: 34 mg/dL — ABNORMAL HIGH (ref 6–23)
Chloride: 102 mEq/L (ref 96–112)
Creatinine, Ser: 1.1 mg/dL (ref 0.50–1.35)
Glucose, Bld: 627 mg/dL (ref 70–99)
HCT: 49 % (ref 39.0–52.0)
Potassium: 4.5 mEq/L (ref 3.5–5.1)

## 2011-05-19 LAB — URINALYSIS, ROUTINE W REFLEX MICROSCOPIC
Bilirubin Urine: NEGATIVE
Glucose, UA: >1000 mg/dL — AB
Hgb urine dipstick: NEGATIVE
Ketones, ur: 15 mg/dL — AB
Ketones, ur: 40 mg/dL — AB
Leukocytes, UA: NEGATIVE
Nitrite: NEGATIVE
Protein, ur: NEGATIVE mg/dL
Specific Gravity, Urine: 1.035 — ABNORMAL HIGH (ref 1.005–1.030)
pH: 6 (ref 5.0–8.0)

## 2011-05-19 LAB — COMPREHENSIVE METABOLIC PANEL
AST: 17 U/L (ref 0–37)
Albumin: 3.9 g/dL (ref 3.5–5.2)
Alkaline Phosphatase: 156 U/L — ABNORMAL HIGH (ref 39–117)
BUN: 31 mg/dL — ABNORMAL HIGH (ref 6–23)
Chloride: 93 mEq/L — ABNORMAL LOW (ref 96–112)
Potassium: 4.4 mEq/L (ref 3.5–5.1)
Total Bilirubin: 0.5 mg/dL (ref 0.3–1.2)

## 2011-05-19 LAB — URINE MICROSCOPIC-ADD ON

## 2011-05-19 LAB — GLUCOSE, CAPILLARY
Glucose-Capillary: 424 mg/dL — ABNORMAL HIGH (ref 70–99)
Glucose-Capillary: 389 mg/dL — ABNORMAL HIGH (ref 70–99)
Glucose-Capillary: 211 mg/dL — ABNORMAL HIGH (ref 70–99)
Glucose-Capillary: 179 mg/dL — ABNORMAL HIGH (ref 70–99)
Glucose-Capillary: 140 mg/dL — ABNORMAL HIGH (ref 70–99)
Glucose-Capillary: 106 mg/dL — ABNORMAL HIGH (ref 70–99)
Glucose-Capillary: 134 mg/dL — ABNORMAL HIGH (ref 70–99)

## 2011-05-19 LAB — BASIC METABOLIC PANEL WITH GFR
BUN: 24 mg/dL — ABNORMAL HIGH (ref 6–23)
CO2: 24 meq/L (ref 19–32)
Calcium: 9.4 mg/dL (ref 8.4–10.5)
Chloride: 109 meq/L (ref 96–112)
Creatinine, Ser: 0.77 mg/dL (ref 0.50–1.35)
GFR calc Af Amer: >90 mL/min
GFR calc non Af Amer: >90 mL/min
Glucose, Bld: 212 mg/dL — ABNORMAL HIGH (ref 70–99)
Potassium: 4.1 meq/L (ref 3.5–5.1)
Sodium: 140 meq/L (ref 135–145)

## 2011-05-19 LAB — MAGNESIUM: Magnesium: 1.6 mg/dL (ref 1.5–2.5)

## 2011-05-19 LAB — HEMOGLOBIN A1C: Hgb A1c MFr Bld: 9.9 % — ABNORMAL HIGH (ref <5.7)

## 2011-05-19 LAB — MRSA PCR SCREENING: MRSA by PCR: NEGATIVE

## 2011-05-19 MED ORDER — DEXTROSE 50 % IV SOLN: 25.0000 mL | INTRAVENOUS | Status: DC | PRN

## 2011-05-19 MED ORDER — LEVOTHYROXINE SODIUM 75 MCG PO TABS
75.0000 ug | ORAL_TABLET | ORAL | Status: DC
  Administered 2011-05-19 – 2011-05-21 (×3): 75 ug via ORAL
  Filled 2011-05-19 (×4): qty 1

## 2011-05-19 MED ORDER — INSULIN ASPART 100 UNIT/ML ~~LOC~~ SOLN: 0.0000 [IU] | Freq: Every day | SUBCUTANEOUS | Status: DC

## 2011-05-19 MED ORDER — INSULIN REGULAR BOLUS VIA INFUSION
0.0000 [IU] | Freq: Once | INTRAVENOUS | Status: DC
  Filled 2011-05-19: qty 10

## 2011-05-19 MED ORDER — DEXTROSE-NACL 5-0.45 % IV SOLN: INTRAVENOUS | Status: DC

## 2011-05-19 MED ORDER — SODIUM CHLORIDE 0.9 % IV SOLN
INTRAVENOUS | Status: DC
  Administered 2011-05-19: 05:00:00 via INTRAVENOUS
  Filled 2011-05-19: qty 1

## 2011-05-19 MED ORDER — ONDANSETRON HCL 4 MG/2ML IJ SOLN
4.0000 mg | Freq: Once | INTRAMUSCULAR | Status: AC
  Administered 2011-05-19: 4 mg via INTRAVENOUS
  Filled 2011-05-19: qty 2

## 2011-05-19 MED ORDER — INSULIN ASPART 100 UNIT/ML ~~LOC~~ SOLN
0.0000 [IU] | Freq: Three times a day (TID) | SUBCUTANEOUS | Status: DC
  Administered 2011-05-19: 1 [IU] via SUBCUTANEOUS
  Administered 2011-05-19: 7 [IU] via SUBCUTANEOUS
  Filled 2011-05-19: qty 3

## 2011-05-19 MED ORDER — MORPHINE SULFATE 2 MG/ML IJ SOLN
1.0000 mg | INTRAMUSCULAR | Status: DC | PRN
  Administered 2011-05-20: 1 mg via INTRAVENOUS
  Filled 2011-05-19: qty 1

## 2011-05-19 MED ORDER — SODIUM CHLORIDE 0.9 % IV SOLN
INTRAVENOUS | Status: DC
  Administered 2011-05-19 – 2011-05-20 (×2): via INTRAVENOUS

## 2011-05-19 MED ORDER — SODIUM CHLORIDE 0.9 % IV SOLN
INTRAVENOUS | Status: AC
  Administered 2011-05-19: 05:00:00 via INTRAVENOUS

## 2011-05-19 MED ORDER — VITAMIN C 500 MG PO TABS
500.0000 mg | ORAL_TABLET | Freq: Every day | ORAL | Status: DC
  Administered 2011-05-19 – 2011-05-21 (×3): 500 mg via ORAL
  Filled 2011-05-19 (×4): qty 1

## 2011-05-19 MED ORDER — SODIUM CHLORIDE 0.9 % IJ SOLN
INTRAMUSCULAR | Status: AC
  Administered 2011-05-19: 3 mL
  Filled 2011-05-19: qty 10

## 2011-05-19 MED ORDER — TACROLIMUS 1 MG PO CAPS
3.0000 mg | ORAL_CAPSULE | Freq: Two times a day (BID) | ORAL | Status: DC
  Administered 2011-05-19 – 2011-05-21 (×5): 3 mg via ORAL
  Filled 2011-05-19 (×8): qty 3

## 2011-05-19 MED ORDER — POTASSIUM CHLORIDE 10 MEQ/100ML IV SOLN
10.0000 meq | INTRAVENOUS | Status: AC
  Administered 2011-05-19 (×4): 10 meq via INTRAVENOUS
  Filled 2011-05-19 (×4): qty 100

## 2011-05-19 MED ORDER — INSULIN ASPART 100 UNIT/ML ~~LOC~~ SOLN
0.0000 [IU] | Freq: Three times a day (TID) | SUBCUTANEOUS | Status: DC
  Administered 2011-05-20: 2 [IU] via SUBCUTANEOUS
  Filled 2011-05-19: qty 3

## 2011-05-19 MED ORDER — INSULIN GLARGINE 100 UNIT/ML ~~LOC~~ SOLN
10.0000 [IU] | SUBCUTANEOUS | Status: DC
Start: 2011-05-19 — End: 2011-05-19
  Administered 2011-05-19: 10 [IU] via SUBCUTANEOUS
  Filled 2011-05-19: qty 3

## 2011-05-19 MED ORDER — INSULIN ASPART 100 UNIT/ML ~~LOC~~ SOLN
15.0000 [IU] | SUBCUTANEOUS | Status: AC
  Administered 2011-05-19: 15 [IU] via SUBCUTANEOUS
  Filled 2011-05-19: qty 3

## 2011-05-19 MED ORDER — DEXTROSE-NACL 5-0.45 % IV SOLN
INTRAVENOUS | Status: DC
  Administered 2011-05-19: 1000 mL via INTRAVENOUS
  Administered 2011-05-19: 06:00:00 via INTRAVENOUS

## 2011-05-19 MED ORDER — CLARITHROMYCIN 500 MG PO TABS
1000.0000 mg | ORAL_TABLET | Freq: Every day | ORAL | Status: DC
  Administered 2011-05-19 – 2011-05-21 (×3): 1000 mg via ORAL
  Filled 2011-05-19 (×4): qty 2

## 2011-05-19 MED ORDER — HYDROCODONE-HOMATROPINE 5-1.5 MG/5ML PO SYRP: 5.0000 mL | ORAL_SOLUTION | Freq: Four times a day (QID) | ORAL | Status: DC | PRN

## 2011-05-19 MED ORDER — MORPHINE SULFATE 4 MG/ML IJ SOLN
4.0000 mg | Freq: Once | INTRAMUSCULAR | Status: AC
Start: 2011-05-19 — End: 2011-05-19
  Administered 2011-05-19: 4 mg via INTRAVENOUS
  Filled 2011-05-19: qty 1

## 2011-05-19 MED ORDER — INSULIN GLARGINE 100 UNIT/ML ~~LOC~~ SOLN
15.0000 [IU] | SUBCUTANEOUS | Status: DC
  Filled 2011-05-19: qty 3

## 2011-05-19 MED ORDER — SODIUM CHLORIDE 0.9 % IV SOLN
INTRAVENOUS | Status: AC
  Administered 2011-05-19: 4.9 [IU]/h via INTRAVENOUS
  Filled 2011-05-19: qty 1

## 2011-05-19 MED ORDER — SODIUM CHLORIDE 0.9 % IV BOLUS (SEPSIS)
1000.0000 mL | Freq: Once | INTRAVENOUS | Status: AC
  Administered 2011-05-19: 1000 mL via INTRAVENOUS

## 2011-05-19 MED ORDER — SODIUM CHLORIDE 0.9 % IV SOLN
INTRAVENOUS | Status: DC
  Administered 2011-05-19: 02:00:00 via INTRAVENOUS

## 2011-05-19 NOTE — ED Provider Notes (Signed)
History     CSN: [id number]  Arrival date & time 05/18/11  2312   First MD Initiated Contact with Patient 05/18/11 2349      Chief Complaint  Patient presents with  . Chest Pain  . Diabetic Ketoacidosis    (Consider location/radiation/quality/duration/timing/severity/associated sxs/prior treatment) Patient is a 36 y.o. male presenting with weakness. The history is provided by the patient.  Weakness The primary symptoms include nausea. The symptoms began 2 days ago.  Additional symptoms include weakness.  Pt states he had a pancreas kidney transplant in 2003. States was doing well. Has been followed by nephrology every 50mon for lab check here. States in the last several days, developed increased tiredness, thirst, diffuse abdominal cramping, urinating a lot. States feels like the last time he was in DKA. Denies fever, chills, malaise. Has had cold symptoms and was started on biaxin and prednisone. No other changes in health or medications.   Past Medical History  Diagnosis Date  . Kidney replaced by transplant   . Pancreas transplanted   . Diabetes mellitus   . Thyroid disease     Past Surgical History  Procedure Date  . Eye surgery     History reviewed. No pertinent family history.  History  Substance Use Topics  . Smoking status: Never Smoker   . Smokeless tobacco: Not on file  . Alcohol Use: No      Review of Systems  HENT: Negative.   Eyes: Negative.   Respiratory: Negative.   Cardiovascular: Negative.   Gastrointestinal: Positive for nausea and abdominal pain.  Genitourinary: Positive for frequency.  Musculoskeletal: Negative.   Skin: Negative.   Neurological: Positive for weakness.  Psychiatric/Behavioral: Negative.     Allergies  Codeine and Reglan  Home Medications   Current Outpatient Rx  Name Route Sig Dispense Refill  . CLARITHROMYCIN 500 MG PO TABS Oral Take 1,000 mg by mouth daily.    Marland Kitchen HYDROCODONE-HOMATROPINE 5-1.5 MG/5ML PO SYRP Oral  Take 5 mLs by mouth every 6 (six) hours as needed. For cough    . LEVOTHYROXINE SODIUM 75 MCG PO TABS Oral Take 75 mcg by mouth daily.    Creed Copper M PLUS PO TABS Oral Take 1 tablet by mouth daily.    Marland Kitchen PREDNISONE 10 MG PO TABS Oral Take by mouth daily. Patient has been on a prednisone dosepak for a few days. He doesn't know the strength, but possibly 10mg .    . TACROLIMUS 1 MG PO CAPS Oral Take 3 mg by mouth 2 (two) times daily.    Marland Kitchen VITAMIN C 500 MG PO TABS Oral Take 500 mg by mouth daily.      BP 151/93  Pulse 98  Temp 98.5 F (36.9 C)  Resp 16  SpO2 98%  Physical Exam  Nursing note and vitals reviewed. Constitutional: He is oriented to person, place, and time. He appears well-developed and well-nourished.       Uncomfortable appearing.   HENT:  Head: Normocephalic and atraumatic.       Oral mucosa dry  Eyes: Conjunctivae are normal.  Neck: Neck supple.  Cardiovascular: Normal rate, regular rhythm and normal heart sounds.   Pulmonary/Chest: Effort normal and breath sounds normal. No respiratory distress.  Abdominal: Soft. Bowel sounds are normal. He exhibits no distension. There is no rebound and no guarding.       Diffuse tenderness  Musculoskeletal: Normal range of motion.  Neurological: He is alert and oriented to person, place, and time.  Skin:  Skin is warm and dry. No rash noted.  Psychiatric: He has a normal mood and affect.    ED Course  Procedures (including critical care time)   Results for orders placed during the hospital encounter of 05/18/11  GLUCOSE, CAPILLARY      Component Value Range   Glucose-Capillary >600 (*) 70 - 99 (mg/dL)  URINALYSIS, ROUTINE W REFLEX MICROSCOPIC      Component Value Range   Color, Urine YELLOW  YELLOW    APPearance CLEAR  CLEAR    Specific Gravity, Urine 1.035 (*) 1.005 - 1.030    pH 6.0  5.0 - 8.0    Glucose, UA >1000 (*) NEGATIVE (mg/dL)   Hgb urine dipstick NEGATIVE  NEGATIVE    Bilirubin Urine NEGATIVE  NEGATIVE     Ketones, ur 15 (*) NEGATIVE (mg/dL)   Protein, ur NEGATIVE  NEGATIVE (mg/dL)   Urobilinogen, UA 0.2  0.0 - 1.0 (mg/dL)   Nitrite NEGATIVE  NEGATIVE    Leukocytes, UA NEGATIVE  NEGATIVE   CBC      Component Value Range   WBC 17.8 (*) 4.0 - 10.5 (K/uL)   RBC 4.77  4.22 - 5.81 (MIL/uL)   Hemoglobin 14.9  13.0 - 17.0 (g/dL)   HCT 42.4  39.0 - 52.0 (%)   MCV 88.9  78.0 - 100.0 (fL)   MCH 31.2  26.0 - 34.0 (pg)   MCHC 35.1  30.0 - 36.0 (g/dL)   RDW 12.1  11.5 - 15.5 (%)   Platelets 449 (*) 150 - 400 (K/uL)  DIFFERENTIAL      Component Value Range   Neutrophils Relative 75  43 - 77 (%)   Neutro Abs 13.4 (*) 1.7 - 7.7 (K/uL)   Lymphocytes Relative 18  12 - 46 (%)   Lymphs Abs 3.1  0.7 - 4.0 (K/uL)   Monocytes Relative 7  3 - 12 (%)   Monocytes Absolute 1.3 (*) 0.1 - 1.0 (K/uL)   Eosinophils Relative 0  0 - 5 (%)   Eosinophils Absolute 0.0  0.0 - 0.7 (K/uL)   Basophils Relative 0  0 - 1 (%)   Basophils Absolute 0.1  0.0 - 0.1 (K/uL)  COMPREHENSIVE METABOLIC PANEL      Component Value Range   Sodium 129 (*) 135 - 145 (mEq/L)   Potassium 4.4  3.5 - 5.1 (mEq/L)   Chloride 93 (*) 96 - 112 (mEq/L)   CO2 21  19 - 32 (mEq/L)   Glucose, Bld 640 (*) 70 - 99 (mg/dL)   BUN 31 (*) 6 - 23 (mg/dL)   Creatinine, Ser 1.04  0.50 - 1.35 (mg/dL)   Calcium 11.0 (*) 8.4 - 10.5 (mg/dL)   Total Protein 7.7  6.0 - 8.3 (g/dL)   Albumin 3.9  3.5 - 5.2 (g/dL)   AST 17  0 - 37 (U/L)   ALT 21  0 - 53 (U/L)   Alkaline Phosphatase 156 (*) 39 - 117 (U/L)   Total Bilirubin 0.5  0.3 - 1.2 (mg/dL)   GFR calc non Af Amer >90  >90 (mL/min)   GFR calc Af Amer >90  >90 (mL/min)  URINALYSIS, ROUTINE W REFLEX MICROSCOPIC      Component Value Range   Color, Urine YELLOW  YELLOW    APPearance CLEAR  CLEAR    Specific Gravity, Urine 1.035 (*) 1.005 - 1.030    pH 6.0  5.0 - 8.0    Glucose, UA >1000 (*) NEGATIVE (mg/dL)  Hgb urine dipstick NEGATIVE  NEGATIVE    Bilirubin Urine NEGATIVE  NEGATIVE    Ketones, ur 40  (*) NEGATIVE (mg/dL)   Protein, ur NEGATIVE  NEGATIVE (mg/dL)   Urobilinogen, UA 0.2  0.0 - 1.0 (mg/dL)   Nitrite NEGATIVE  NEGATIVE    Leukocytes, UA NEGATIVE  NEGATIVE   URINE MICROSCOPIC-ADD ON      Component Value Range   Squamous Epithelial / LPF RARE  RARE    Bacteria, UA RARE  RARE   POCT I-STAT, CHEM 8      Component Value Range   Sodium 134 (*) 135 - 145 (mEq/L)   Potassium 4.5  3.5 - 5.1 (mEq/L)   Chloride 102  96 - 112 (mEq/L)   BUN 34 (*) 6 - 23 (mg/dL)   Creatinine, Ser 1.10  0.50 - 1.35 (mg/dL)   Glucose, Bld 627 (*) 70 - 99 (mg/dL)   Calcium, Ion 1.36 (*) 1.12 - 1.32 (mmol/L)   TCO2 24  0 - 100 (mmol/L)   Hemoglobin 16.7  13.0 - 17.0 (g/dL)   HCT 49.0  39.0 - 52.0 (%)   Comment NOTIFIED PHYSICIAN    LIPASE, BLOOD      Component Value Range   Lipase 19  11 - 59 (U/L)  URINE MICROSCOPIC-ADD ON      Component Value Range   Squamous Epithelial / LPF RARE  RARE    WBC, UA 0-2  <3 (WBC/hpf)   RBC / HPF 0-2  <3 (RBC/hpf)   Bacteria, UA RARE  RARE   POCT I-STAT 3, BLOOD GAS (G3P V)      Component Value Range   pH, Ven 7.325 (*) 7.250 - 7.300    pCO2, Ven 39.4 (*) 45.0 - 50.0 (mmHg)   pO2, Ven 37.0  30.0 - 45.0 (mmHg)   Bicarbonate 20.5  20.0 - 24.0 (mEq/L)   TCO2 22  0 - 100 (mmol/L)   O2 Saturation 67.0     Acid-base deficit 5.0 (*) 0.0 - 2.0 (mmol/L)   Sample type VENOUS     Comment NOTIFIED PHYSICIAN    GLUCOSE, CAPILLARY      Component Value Range   Glucose-Capillary 548 (*) 70 - 99 (mg/dL)   Comment 1 Notify RN     Comment 2 Documented in Chart     No results found.   Glu 640, fluids started, will start glucose stabilizer. Anion gap 15, ph non acidotic, will admit for further treatment. Concern for rejection of pancreatic transplant. Pt aao, nad. VS hypertensive, otherwise normal.    1:31 AM Spoke with [facility] will admit. Asked to get acute abdominal series.     [place], [place] 05/19/11 [phone]

## 2011-05-19 NOTE — H&P (Signed)
Daniel Hill is an 36 y.o. male.   PCP - Nephrologist Dr.Deterding. Chief Complaint: Chest pain. HPI: 36 year old male history of renal and pancreatic transplant in 2003 with history of diabetes mellitus 1 prior to that, history of hypothyroidism presented to the ER because of chest pain abdominal pain since last evening. The chest pain and abdominal pain crampy in nature has no relation to exertion did not have any shortness of breath. Patient was found to have a high blood sugar and mild anion gap of 15. Patient has been admitted for uncontrolled diabetes with early DKA. Patient states he had upper respiratory tract infection 2 weeks ago. He was tested for flu but was negative. His ongoing wheezing and cough he had gone to an urgent care 4 days ago when he was prescribed antibiotics and steroid taper. Patient had taken those medicines for 3 days. Presently is not having any cough or any shortness of breath. Abdominal pain is superficial and crampy and nonradiating. Denies any nausea vomiting or diarrhea.  Past Medical History  Diagnosis Date  . Kidney replaced by transplant   . Pancreas transplanted   . Diabetes mellitus   . Thyroid disease     Past Surgical History  Procedure Date  . Eye surgery     Family History  Problem Relation Age of Onset  . Stroke Mother   . Lymphoma Father   . Diabetes type II Father    Social History:  reports that he has never smoked. He does not have any smokeless tobacco history on file. He reports that he does not drink alcohol or use illicit drugs.  Allergies:  Allergies  Allergen Reactions  . Codeine Nausea And Vomiting  . Reglan Nausea And Vomiting    Medications Prior to Admission  Medication Dose Route Frequency Provider Last Rate Last Dose  . 0.9 %  sodium chloride infusion   Intravenous Continuous Tatyana A Kirichenko, PA 125 mL/hr at 05/19/11 0134    . dextrose 5 %-0.45 % sodium chloride infusion   Intravenous Continuous Tatyana A  Kirichenko, PA      . dextrose 50 % solution 25 mL  25 mL Intravenous PRN Tatyana A Kirichenko, PA      . insulin regular (NOVOLIN R,HUMULIN R) 1 Units/mL in sodium chloride 0.9 % 100 mL infusion   Intravenous To Major Tatyana A Kirichenko, PA 3.6 mL/hr at 05/19/11 0256 3.6 Units/hr at 05/19/11 0256  . insulin regular bolus via infusion 0-10 Units  0-10 Units Intravenous Once Tatyana A Kirichenko, PA      . morphine 4 MG/ML injection 4 mg  4 mg Intravenous Once Tatyana A Kirichenko, PA   4 mg at 05/19/11 0131  . ondansetron (ZOFRAN) injection 4 mg  4 mg Intravenous Once Tatyana A Kirichenko, PA   4 mg at 05/19/11 0130  . sodium chloride 0.9 % bolus 1,000 mL  1,000 mL Intravenous Once Tatyana A Kirichenko, PA   1,000 mL at 05/19/11 0012   No current outpatient prescriptions on file as of 05/18/2011.    Results for orders placed during the hospital encounter of 05/18/11 (from the past 48 hour(s))  GLUCOSE, CAPILLARY     Status: Abnormal   Collection Time   05/18/11 11:21 PM      Component Value Range Comment   Glucose-Capillary >600 (*) 70 - 99 (mg/dL)   URINALYSIS, ROUTINE W REFLEX MICROSCOPIC     Status: Abnormal   Collection Time   05/18/11 11:40 PM  Component Value Range Comment   Color, Urine YELLOW  YELLOW     APPearance CLEAR  CLEAR     Specific Gravity, Urine 1.035 (*) 1.005 - 1.030     pH 6.0  5.0 - 8.0     Glucose, UA >1000 (*) NEGATIVE (mg/dL)    Hgb urine dipstick NEGATIVE  NEGATIVE     Bilirubin Urine NEGATIVE  NEGATIVE     Ketones, ur 15 (*) NEGATIVE (mg/dL)    Protein, ur NEGATIVE  NEGATIVE (mg/dL)    Urobilinogen, UA 0.2  0.0 - 1.0 (mg/dL)    Nitrite NEGATIVE  NEGATIVE     Leukocytes, UA NEGATIVE  NEGATIVE    URINE MICROSCOPIC-ADD ON     Status: Normal   Collection Time   05/18/11 11:40 PM      Component Value Range Comment   Squamous Epithelial / LPF RARE  RARE     Bacteria, UA RARE  RARE    CBC     Status: Abnormal   Collection Time   05/19/11 12:03 AM       Component Value Range Comment   WBC 17.8 (*) 4.0 - 10.5 (K/uL)    RBC 4.77  4.22 - 5.81 (MIL/uL)    Hemoglobin 14.9  13.0 - 17.0 (g/dL)    HCT 42.4  39.0 - 52.0 (%)    MCV 88.9  78.0 - 100.0 (fL)    MCH 31.2  26.0 - 34.0 (pg)    MCHC 35.1  30.0 - 36.0 (g/dL)    RDW 12.1  11.5 - 15.5 (%)    Platelets 449 (*) 150 - 400 (K/uL)   DIFFERENTIAL     Status: Abnormal   Collection Time   05/19/11 12:03 AM      Component Value Range Comment   Neutrophils Relative 75  43 - 77 (%)    Neutro Abs 13.4 (*) 1.7 - 7.7 (K/uL)    Lymphocytes Relative 18  12 - 46 (%)    Lymphs Abs 3.1  0.7 - 4.0 (K/uL)    Monocytes Relative 7  3 - 12 (%)    Monocytes Absolute 1.3 (*) 0.1 - 1.0 (K/uL)    Eosinophils Relative 0  0 - 5 (%)    Eosinophils Absolute 0.0  0.0 - 0.7 (K/uL)    Basophils Relative 0  0 - 1 (%)    Basophils Absolute 0.1  0.0 - 0.1 (K/uL)   COMPREHENSIVE METABOLIC PANEL     Status: Abnormal   Collection Time   05/19/11 12:03 AM      Component Value Range Comment   Sodium 129 (*) 135 - 145 (mEq/L)    Potassium 4.4  3.5 - 5.1 (mEq/L)    Chloride 93 (*) 96 - 112 (mEq/L)    CO2 21  19 - 32 (mEq/L)    Glucose, Bld 640 (*) 70 - 99 (mg/dL)    BUN 31 (*) 6 - 23 (mg/dL)    Creatinine, Ser 1.04  0.50 - 1.35 (mg/dL)    Calcium 11.0 (*) 8.4 - 10.5 (mg/dL)    Total Protein 7.7  6.0 - 8.3 (g/dL)    Albumin 3.9  3.5 - 5.2 (g/dL)    AST 17  0 - 37 (U/L)    ALT 21  0 - 53 (U/L)    Alkaline Phosphatase 156 (*) 39 - 117 (U/L)    Total Bilirubin 0.5  0.3 - 1.2 (mg/dL)    GFR calc non Af Amer >90  >  90 (mL/min)    GFR calc Af Amer >90  >90 (mL/min)   LIPASE, BLOOD     Status: Normal   Collection Time   05/19/11 12:03 AM      Component Value Range Comment   Lipase 19  11 - 59 (U/L)   URINALYSIS, ROUTINE W REFLEX MICROSCOPIC     Status: Abnormal   Collection Time   05/19/11 12:14 AM      Component Value Range Comment   Color, Urine YELLOW  YELLOW     APPearance CLEAR  CLEAR     Specific Gravity, Urine  1.035 (*) 1.005 - 1.030     pH 6.0  5.0 - 8.0     Glucose, UA >1000 (*) NEGATIVE (mg/dL)    Hgb urine dipstick NEGATIVE  NEGATIVE     Bilirubin Urine NEGATIVE  NEGATIVE     Ketones, ur 40 (*) NEGATIVE (mg/dL)    Protein, ur NEGATIVE  NEGATIVE (mg/dL)    Urobilinogen, UA 0.2  0.0 - 1.0 (mg/dL)    Nitrite NEGATIVE  NEGATIVE  NEGATIVE   Leukocytes, UA NEGATIVE  NEGATIVE    URINE MICROSCOPIC-ADD ON     Status: Normal   Collection Time   05/19/11 12:14 AM      Component Value Range Comment   Squamous Epithelial / LPF RARE  RARE     WBC, UA 0-2  <3 (WBC/hpf)    RBC / HPF 0-2  <3 (RBC/hpf)    Bacteria, UA RARE  RARE    POCT I-STAT, CHEM 8     Status: Abnormal   Collection Time   05/19/11 12:33 AM      Component Value Range Comment   Sodium 134 (*) 135 - 145 (mEq/L)    Potassium 4.5  3.5 - 5.1 (mEq/L)    Chloride 102  96 - 112 (mEq/L)    BUN 34 (*) 6 - 23 (mg/dL)    Creatinine, Ser 1.10  0.50 - 1.35 (mg/dL)    Glucose, Bld 627 (*) 70 - 99 (mg/dL)    Calcium, Ion 1.36 (*) 1.12 - 1.32 (mmol/L)    TCO2 24  0 - 100 (mmol/L)    Hemoglobin 16.7  13.0 - 17.0 (g/dL)    HCT 49.0  39.0 - 52.0 (%)    Comment NOTIFIED PHYSICIAN     POCT I-STAT 3, BLOOD GAS (G3P V)     Status: Abnormal   Collection Time   05/19/11 12:54 AM      Component Value Range Comment   pH, Ven 7.325 (*) 7.250 - 7.300     pCO2, Ven 39.4 (*) 45.0 - 50.0 (mmHg)    pO2, Ven 37.0  30.0 - 45.0 (mmHg)    Bicarbonate 20.5  20.0 - 24.0 (mEq/L)    TCO2 22  0 - 100 (mmol/L)    O2 Saturation 67.0      Acid-base deficit 5.0 (*) 0.0 - 2.0 (mmol/L)    Sample type VENOUS      Comment NOTIFIED PHYSICIAN     GLUCOSE, CAPILLARY     Status: Abnormal   Collection Time   05/19/11  1:07 AM      Component Value Range Comment   Glucose-Capillary 548 (*) 70 - 99 (mg/dL)    Comment 1 Notify RN      Comment 2 Documented in Chart     GLUCOSE, CAPILLARY     Status: Abnormal   Collection Time   05/19/11  2:53 AM  Component Value Range  Comment   Glucose-Capillary 424 (*) 70 - 99 (mg/dL)    Dg Abd Acute W/chest  05/19/2011  *RADIOLOGY REPORT*  Clinical Data: Diffuse abdominal pain and lower chest pain; history of diabetes.  ACUTE ABDOMEN SERIES (ABDOMEN 2 VIEW & CHEST 1 VIEW)  Comparison: None.  Findings: The lungs are well-aerated and clear.  There is no evidence of focal opacification, pleural effusion or pneumothorax. The cardiomediastinal silhouette is within normal limits.  The visualized bowel gas pattern is unremarkable. Stool and air are noted throughout the colon; there is no evidence of small bowel dilatation to suggest obstruction.  No free intra-abdominal air is identified on the provided upright view.  Scattered clips are noted about the abdomen and pelvis.  No acute osseous abnormalities are seen; the sacroiliac joints are unremarkable in appearance.  IMPRESSION:  1.  Unremarkable bowel gas pattern; no free intra-abdominal air seen. 2.  No acute cardiopulmonary process identified.  Original Report Authenticated By: Santa Lighter, M.D.    Review of Systems  Constitutional: Negative.   HENT: Negative.   Eyes: Negative.   Respiratory: Negative.   Cardiovascular: Positive for chest pain.  Gastrointestinal: Positive for abdominal pain.  Genitourinary: Positive for frequency.  Musculoskeletal: Negative.   Skin: Negative.   Neurological: Negative.   Endo/Heme/Allergies: Negative.   Psychiatric/Behavioral: Negative.     Blood pressure 127/80, pulse 91, temperature 98.6 F (37 C), temperature source Oral, resp. rate 20, SpO2 95.00%. Physical Exam  Constitutional: He is oriented to person, place, and time. He appears well-developed and well-nourished. No distress.  HENT:  Head: Normocephalic and atraumatic.  Right Ear: External ear normal.  Left Ear: External ear normal.  Nose: Nose normal.  Mouth/Throat: Oropharynx is clear and moist. No oropharyngeal exudate.  Eyes: Conjunctivae and EOM are normal. Pupils are  equal, round, and reactive to light. Right eye exhibits no discharge. Left eye exhibits no discharge. No scleral icterus.  Neck: Normal range of motion. Neck supple.  Cardiovascular: Normal rate, regular rhythm and normal heart sounds.   Respiratory: Effort normal and breath sounds normal. No respiratory distress. He has no wheezes. He has no rales.  GI: Soft. Bowel sounds are normal. He exhibits no distension. There is no tenderness. There is no rebound.  Musculoskeletal: Normal range of motion.  Neurological: He is alert and oriented to person, place, and time.       Moves upper and lower extremities 5/5.  Skin: Skin is warm and dry. No rash noted. He is not diaphoretic. No erythema.  Psychiatric: His behavior is normal.     Assessment/Plan #1. Diabetic ketoacidosis in a patient with renal and pancreatic transplant in 2003 - this time his blood sugars are very high with anion gap about 15. We will treat this as early DKA. Patient states that he gets his hemoglobin A1c check always every 3 months through his nephrologist. It is always below 7 even a couple of months ago. We may have to consult endocrinologist in a.m. to see if he is developing any rejection. Patient is presently on IV insulin and we will be following DKA protocol. #2. Mild hypercalcemia - may be from his dehydration. We will check parathormone and vitamin D levels. #3. History of hypothyroidism - continue present dose of Synthroid and check TSH. #4. Chest pain and abdominal pain - chest pain as atypical and his abdomen is benign in examination. We will cycle cardiac markers closely observe patient. #5. History of renal and pancreatic transplant -  patient usually takes Prograf and Imuran. We will continue that.  CODE STATUS - full code.  KAKRAKANDY,ARSHAD N. 05/19/2011, 3:08 AM

## 2011-05-19 NOTE — Progress Notes (Addendum)
[name].  He was diagnosed with diabetes at age 36.  He underwent a pancreas/renal transplant at [facility] in 2003.  His primary care MD is Dr. Jimmy Footman and he see's him every 3 months.  All A1C's have been less than 7.0%.  He states that he has had a respiratory infection for the past 3 weeks and was on prednisone.  Started having increased thirst/urination on Monday.  He has a new baby at [facility] hospital and is anxious to get home to her and his wife.  Consider A1C to determine past 2-3 month CBG average.  Difficult to know what his insulin needs will be?  Consider endocrinology consult?  He weighs 104.4 kg.  10:20a  Spoke to Erin Hearing, NP.  She will discuss with Dr. Wynelle Cleveland.  Will be glad to assist as needed.

## 2011-05-19 NOTE — ED Notes (Signed)
Glucose is 640 per lab.

## 2011-05-19 NOTE — Progress Notes (Signed)
   CARE MANAGEMENT NOTE 05/19/2011  Patient:  Daniel Hill, Daniel Hill   Account Number:  [id number]  Date Initiated:  05/19/2011  Documentation initiated by:  Lars Pinks  Subjective/Objective Assessment:   PT WAS ADMITTED WITH DKA     Action/Plan:   PROGRESSION OF CARE AND DISCHARGE PLANNING   Anticipated DC Date:  05/21/2011   Anticipated DC Plan:  [place]  CM consult      Choice offered to / List presented to:             Status of service:  In process, will continue to follow Medicare Important Message given?   (If response is "NO", the following Medicare IM given date fields will be blank) Date Medicare IM given:   Date Additional Medicare IM given:    Discharge Disposition:    Per UR Regulation:  Reviewed for med. necessity/level of care/duration of stay  Comments:  05/19/11 Lars Pinks, RN, BSN [place] WITH DKA.  PTA PT WAS AT HOME WITH SELF CARE.  WILL F/U ON DC NEEDS

## 2011-05-20 DIAGNOSIS — Z940 Kidney transplant status: Secondary | ICD-10-CM

## 2011-05-20 DIAGNOSIS — E669 Obesity, unspecified: Secondary | ICD-10-CM | POA: Diagnosis present

## 2011-05-20 DIAGNOSIS — T86890 Other transplanted tissue rejection: Secondary | ICD-10-CM | POA: Diagnosis present

## 2011-05-20 LAB — GLUCOSE, CAPILLARY
Glucose-Capillary: 102 mg/dL — ABNORMAL HIGH (ref 70–99)
Glucose-Capillary: 151 mg/dL — ABNORMAL HIGH (ref 70–99)
Glucose-Capillary: 151 mg/dL — ABNORMAL HIGH (ref 70–99)
Glucose-Capillary: 185 mg/dL — ABNORMAL HIGH (ref 70–99)
Glucose-Capillary: 68 mg/dL — ABNORMAL LOW (ref 70–99)

## 2011-05-20 LAB — BASIC METABOLIC PANEL
CO2: 26 mEq/L (ref 19–32)
Calcium: 8.7 mg/dL (ref 8.4–10.5)
Chloride: 106 mEq/L (ref 96–112)
Creatinine, Ser: 0.78 mg/dL (ref 0.50–1.35)
Glucose, Bld: 116 mg/dL — ABNORMAL HIGH (ref 70–99)

## 2011-05-20 LAB — LIPASE, BLOOD: Lipase: 62 U/L — ABNORMAL HIGH (ref 11–59)

## 2011-05-20 LAB — AMYLASE: Amylase: 44 U/L (ref 0–105)

## 2011-05-20 MED ORDER — INSULIN GLARGINE 100 UNIT/ML ~~LOC~~ SOLN
20.0000 [IU] | Freq: Every day | SUBCUTANEOUS | Status: DC
  Administered 2011-05-20 – 2011-05-21 (×2): 20 [IU] via SUBCUTANEOUS

## 2011-05-20 MED ORDER — ACETAMINOPHEN 325 MG PO TABS
650.0000 mg | ORAL_TABLET | Freq: Four times a day (QID) | ORAL | Status: DC | PRN
  Administered 2011-05-20: 650 mg via ORAL
  Filled 2011-05-20: qty 2

## 2011-05-20 MED ORDER — INSULIN ASPART 100 UNIT/ML ~~LOC~~ SOLN
0.0000 [IU] | Freq: Three times a day (TID) | SUBCUTANEOUS | Status: DC
  Administered 2011-05-20 – 2011-05-21 (×2): 3 [IU] via SUBCUTANEOUS
  Administered 2011-05-21: 1 [IU] via SUBCUTANEOUS

## 2011-05-20 MED ORDER — INSULIN ASPART 100 UNIT/ML ~~LOC~~ SOLN
4.0000 [IU] | Freq: Three times a day (TID) | SUBCUTANEOUS | Status: DC
  Administered 2011-05-20 – 2011-05-21 (×4): 4 [IU] via SUBCUTANEOUS

## 2011-05-20 MED ORDER — INSULIN PEN STARTER KIT
1.0000 | Freq: Once | Status: AC
  Administered 2011-05-20: 1
  Filled 2011-05-20: qty 1

## 2011-05-20 MED ORDER — INSULIN ASPART 100 UNIT/ML ~~LOC~~ SOLN: 0.0000 [IU] | Freq: Every day | SUBCUTANEOUS | Status: DC

## 2011-05-20 NOTE — Progress Notes (Signed)
CBG: 68  Treatment:  orange juice 8oz  Symptoms:  none  Follow-up CBG: Time:0340 CBG Result:102  Possible Reasons for Event: insulin coverage compared to meal intake  Comments/MD notified:Kirby NP  Lamar Sprinkles

## 2011-05-20 NOTE — Progress Notes (Signed)
[facility]  Subjective: Patient denying any symptoms such as chest pain or shortness of breath. He endorses he had a headache earlier but this is improved after medication treatment. He had multiple questions regarding why he presented with DKA. Dr. Thereasa Solo has discussed likely etiologies extensively with the patient today after talking with the patient's transplant physician Dr. Duane Boston M.D. at [facility], as well as his local MD Dr. Clair Gulling Deterding. At this point it is felt that he may be evolving a chronic pancreatic rejection/end of life for his pancreatic translplant vs/ a newly diagnosed DM2. Patient endorses he has taken insulin before and is familiar with checking CBGs and administering injections.  Objective: Blood pressure 123/82, pulse 79, temperature 98.4 F (36.9 C), temperature source Oral, resp. rate 20, height 5\' 10"  (1.778 m), weight 104.4 kg (230 lb 2.6 oz), SpO2 92.00%.  Intake/Output from previous day: 01/24 0701 - 01/25 0700 In: 2158.6 [P.O.:480; I.V.:1478.6; IV Piggyback:200] Out: 1750 [Urine:1750] Intake/Output this shift: Total I/O In: 150 [I.V.:150] Out: 475 [Urine:475]  Lab Results:  [facility] 05/19/11 0620 05/19/11 0324  WBC 14.4* 16.6*  HGB 13.1 14.4  HCT 38.2* 40.1  PLT 403* 410*   BMET  Basename 05/20/11 0425 05/19/11 1923  NA 140 133*  K 3.8 4.5  CL 106 101  CO2 26 22  GLUCOSE 116* 507*  BUN 15 20  CREATININE 0.78 0.93  CALCIUM 8.7 9.3   General appearance: alert, cooperative, appears stated age and no distress Resp: clear to auscultation bilaterally Cardio: regular rate and rhythm, S1, S2 normal, no murmur, click, rub or gallop GI: soft, non-tender; bowel sounds normal; no masses,  no organomegaly Extremities: extremities normal, atraumatic, no cyanosis or edema  Studies/Results: Dg Abd Acute W/chest  05/19/2011  *RADIOLOGY REPORT*  Clinical Data: Diffuse abdominal pain and lower chest pain; history of diabetes.  ACUTE  ABDOMEN SERIES (ABDOMEN 2 VIEW & CHEST 1 VIEW)  Comparison: None.  IMPRESSION:  1.  Unremarkable bowel gas pattern; no free intra-abdominal air seen. 2.  No acute cardiopulmonary process identified.  Original Report Authenticated By: Santa Lighter, M.D.   Medications:  I have reviewed the patient's current medications.  Assessment/Plan:  Early DKA (diabetic ketoacidoses)/ ?chronic rejection of pancreas transplant Hyperglycemia persists despite adequate time for washout of previously ingested prednisone. Hemoglobin A1c 9.9. Currently sugars are better controlled on Lantus and sliding scale insulin. Concerns were raised today regarding patient presentation with DKA/insulin dependent diabetes symptoms possibly and indication of pancreas rejection. Dr. Thereasa Solo discussed with Dr. Duane Boston at [facility] who confirmed that this patient could be experiencing either end-of-life of the transplanted pancreas versus newly acquired DM2. It was recommend we check a C-peptide level to investigate his insulin synthetic ability. Dr. Tamala Julian did further clarify that the patient's native pancreas remains in place and would prevent classic sx of GI pancreatic insuff. We did check amylase and lipase this admission. At presentation the lipase was normal but had subsequently increased to just above normal at 62.  Appreciate assistance and diligence of the diabetes coordinater Rubie Maid, RN  Chest pain Resolved - cardiac panel negative as well as EKG for any evidence of ischemia.  History of renal transplant Renal function appears to be unaffected at the present time  Hypothyroidism Remains on home dose of Synthroid  Obesity ?Contributing factor to current diabetes symptoms - pt admits to signif weight gain over the last 59months  Disposition Transfer to medical floor Anticipate discharge in a.m. if medically stable  LOS: 2 days   Daniel Hill, ANP pager [phone]  [facility]  [email or web address] Password: [place] Nizhoni  05/20/2011, 1:39 PM  I have personally examined this patient and reviewed the entire database. I have reviewed the above note, made any necessary editorial changes, and agree with its content.  Daniel Altes, MD [facility]

## 2011-05-20 NOTE — Progress Notes (Signed)
Patient is very concerned with the fluctuations in his blood sugar after coming off the insulin drip and only eating a small amount. He has asked that doctor Deterding be consulted, stating that "he knows me and my medical issues very well since I see him on a regular basis."  Discussed the patients concerns with NP Baltazar Najjar. Will check blood sugars q2 hr x3 after a blood sugar and 2200 of 500.  Daniel Hill

## 2011-05-20 NOTE — Plan of Care (Signed)
Problem: Consults Goal: Diagnosis-Diabetes Mellitus Outcome: Progressing Hyperglycemia     

## 2011-05-20 NOTE — Progress Notes (Signed)
Spoke to  Patient at length regarding the actions of Lantus and Novolog.  Encouraged him to monitor CBG's before meals, 2 hour post-prandial, and around 2 am.  Discussed the mechanism for Novolog meal coverage and that it should be taken with meals and correction to cover elevated CBG's.  Currently patient is on Lantus 20 units, Novolog meal coverage 4 units, and Novolog correction.  Told him that doses would be reviewed at discharge based on orders.  He asked me to talk to his wife on the phone regarding his insulin.  Reviewed with them both signs and symptoms and proper treatment of hypoglycemia.  Discussed with RN.  Explained that patient needs prompt follow-up with MD regarding titration of insulin/CBG's.  He would like to see endocrinologist.  Reviewed insulin pen use and asked RN to let him self-administer insulin today.

## 2011-05-20 NOTE — Progress Notes (Signed)
Report called to receiving nurse ([place]).  Past medical history and present hospitalization reported.  All questions answered.  All belongings transported with patient via wheelchair.  Wife notified of transfer via phone by patient.  Accepting nurse notified of patients arrival and follow-up with physician visit prior to transport/transfer.

## 2011-05-20 NOTE — Progress Notes (Signed)
Note patients CBG up to 507 last PM after transition off insulin drip.  He received 10 units of Lantus when transitioned off insulin drip.  A1C=9.9% indicating an average CBG of  267 mg/dL in the past 2-3 months.  Based on CBG increasing back up to 500's after transition off insulin drip, it appears patient will likely need insulin.  Consider Lantus 20 units daily, Novolog meal coverage, and correction Novolog. Will be glad to assist as needed.

## 2011-05-21 LAB — GLUCOSE, CAPILLARY
Glucose-Capillary: 209 mg/dL — ABNORMAL HIGH (ref 70–99)
Glucose-Capillary: 131 mg/dL — ABNORMAL HIGH (ref 70–99)

## 2011-05-21 MED ORDER — INSULIN GLARGINE 100 UNIT/ML ~~LOC~~ SOLN: 20.0000 [IU] | Freq: Every day | SUBCUTANEOUS | Status: DC

## 2011-05-21 MED ORDER — INSULIN ASPART 100 UNIT/ML ~~LOC~~ SOLN: 4.0000 [IU] | Freq: Three times a day (TID) | SUBCUTANEOUS | Status: DC

## 2011-05-21 MED ORDER — INSULIN ASPART 100 UNIT/ML ~~LOC~~ SOLN: SUBCUTANEOUS | Status: DC

## 2011-05-21 MED ORDER — ACCU-CHEK MULTICLIX LANCETS MISC: Status: AC

## 2011-05-21 NOTE — Discharge Summary (Signed)
DISCHARGE SUMMARY  Daniel Hill  MR#: [id number]  DOB:1975-04-27  Date of Admission: 05/18/2011 Date of Discharge: 05/21/2011  Attending [place]  Patient's PCP: Dr. Nicole Kindred, [facility]  Consults:  Telephone consultation with: Dr. Clair Gulling Deterding Dr. Duane Boston, Nephrology/Transplant, [facility]  Discharge Diagnoses: Present on Admission:  .DKA (diabetic ketoacidoses) .Mild chronic rejection of pancreas transplant .Hypothyroidism .Obesity  Medication List  As of 05/21/2011 11:01 AM   STOP taking these medications         clarithromycin 500 MG tablet      predniSONE 10 MG tablet         TAKE these medications         HYDROcodone-homatropine 5-1.5 MG/5ML syrup   Commonly known as: HYCODAN   Take 5 mLs by mouth every 6 (six) hours as needed. For cough      insulin aspart 100 UNIT/ML injection   Commonly known as: novoLOG   Inject 4 Units into the skin 3 (three) times daily with meals.      insulin aspart 100 UNIT/ML injection   Commonly known as: novoLOG   SLIDING SCALE - to be added to your scheduled meal coverage based upon CBG values as follows:  CBG 121-150 add 1 unit additional / CBG 151-200 add 2 units additional / CBG 201-250 = 3 units / CBG 251-300 = 5 units / CBG 301-350 = 7 units / CBG 351-400 = 9 units / CBG > 400 = 12 units and contact your MD for further advice      insulin glargine 100 UNIT/ML injection   Commonly known as: LANTUS   Inject 20 Units into the skin daily.      levothyroxine 75 MCG tablet   Commonly known as: SYNTHROID, LEVOTHROID   Take 75 mcg by mouth daily.      multivitamins ther. w/minerals Tabs   Take 1 tablet by mouth daily.      tacrolimus 1 MG capsule   Commonly known as: PROGRAF   Take 3 mg by mouth 2 (two) times daily.      vitamin C 500 MG tablet   Commonly known as: ASCORBIC ACID   Take 500 mg by mouth daily.           Hospital Course: Present on Admission:  36 year old  male with a history of renal and pancreatic transplant in 2003 at [facility] with history of diabetes mellitus 1 prior to that, who presented to the ER with c/o abdominal discomfort and generalized fatigue, "feeling like I could be in DKA."  The  Patient was diagnosed with an URI at a local urgent care center, and was prescribed biaxin and a prednisone taper. Evaluation in the ER revealed a diagnosis of DKA.    DKA (diabetic ketoacidoses) The patient was admitted to the acute units and treated in the usual fashion with an intravenous insulin drip and aggressive volume resuscitation.  His ketoacidosis quickly corrected as evidenced by a normal anion gap and normal bicarbonate level.  He was transitioned from the insulin drip.  He was placed on Lantus insulin, scheduled NovoLog meal coverage, and sliding scale insulin.  He was reeducated on CBG monitoring and insulin dosing.  At the present time his CBGs are reasonably well controlled.  It is however likely that further titration of his insulin regimen will be required in the outpatient setting.  Please see further discussion below.  Mild chronic rejection of pancreas transplant As noted the patient did undergo a pancreatic transplant  along with a kidney transplant in 2003 at [facility].  As expected the patient no longer had difficulty with diabetes post transplant.  The fact that he presented to the hospital this admission and DKA was concerning for possible rejection of the pancreatic portion of his transplant.  Fortunately his renal function was entirely normal.  I had a telephone conversation with Dr. Jimmy Footman, his local nephrologist/primary care physician, as well as Dr. Duane Boston with the transplant team at [facility].  Dr. Tamala Julian reported that it was likely the patient's transplanted pancreas was simply reaching the end of its life span (typically approximately 10 years).  Also we discussed the possibility the patient could simply have  developed a new type 2 diabetes due to his significant weight gain in the past 3-4 years.  As per our discussion I obtained a C-peptide level.  This value was found to be in a low normal range at approximately 1.2.  This is encouraging and suggested the transplanted pancreas has not entirely failed at this time.  I suspect the patient's uncontrolled recurrent diabetes is due to a combination of diminished function in his transplanted pancreas as well as significant peripheral insulin resistance due to obesity.  I have had a very frank discussion with the patient and explained to him that significant weight loss is an absolute requirement.  I have also explained that he may require lifelong insulin from this point forward regardless.  As per my discussion with Dr. Tamala Julian no specific medication changes or further evaluations are needed in regard to the transplanted pancreas.  Hypothyroidism No acute complications during his hospital stay   Obesity As per my discussion above  Day of Discharge BP 118/80  Pulse 76  Temp(Src) 97.7 F (36.5 C) (Oral)  Resp 20  Ht 5\' 10"  (1.778 m)  Wt 104.4 kg (230 lb 2.6 oz)  BMI 33.02 kg/m2  SpO2 97%  Physical Exam: General: No acute respiratory distress Lungs: Clear to auscultation bilaterally without wheezes or crackles Cardiovascular: Regular rate and rhythm without murmur gallop or rub normal S1 and S2 Abdomen: Obese - Nontender, nondistended, soft, bowel sounds positive, no rebound, no ascites, no appreciable mass Extremities: No significant cyanosis, clubbing, or edema bilateral lower extremities  Results for orders placed during the hospital encounter of 05/18/11 (from the past 24 hour(s))  GLUCOSE, CAPILLARY     Status: Abnormal   Collection Time   05/20/11 12:06 PM      Component Value Range   Glucose-Capillary 229 (*) 70 - 99 (mg/dL)   Comment 1 Notify RN    C-PEPTIDE     Status: Normal   Collection Time   05/20/11  1:23 PM      Component Value  Range   C-Peptide 1.16  0.80 - 3.90 (ng/mL)  GLUCOSE, CAPILLARY     Status: Abnormal   Collection Time   05/20/11  5:06 PM      Component Value Range   Glucose-Capillary 119 (*) 70 - 99 (mg/dL)  GLUCOSE, CAPILLARY     Status: Abnormal   Collection Time   05/20/11  9:48 PM      Component Value Range   Glucose-Capillary 185 (*) 70 - 99 (mg/dL)   Comment 1 Notify RN    GLUCOSE, CAPILLARY     Status: Abnormal   Collection Time   05/21/11  7:16 AM      Component Value Range   Glucose-Capillary 209 (*) 70 - 99 (mg/dL)   Disposition: Discharge  home  Follow-up Appts: Discharge Orders    Future Orders Please Complete By Expires   Diet Carb Modified      Increase activity slowly         Follow-up Information    Follow up with DETERDING,JAMES L, MD. Call in 2 days. (schedule a f/u within the next 7-10 days for review of your CBGs)    Contact information:   [address] [place]         Time spent in discharge (includes decision making & examination of pt): <30 minutes  Signed: MCCLUNG,JEFFREY T 05/21/2011, 11:01 AM

## 2011-05-24 ENCOUNTER — Telehealth: Payer: Self-pay | Admitting: *Deleted

## 2011-05-24 LAB — GLUTAMIC ACID DECARBOXYLASE AUTO ABS: Glutamic Acid Decarb Ab: <1 U/mL (ref <=1.0)

## 2011-05-25 NOTE — ED Provider Notes (Signed)
Evaluation and management procedures were performed by the PA/NP under my supervision/collaboration.    Charlena Cross, MD 05/25/11 [phone]

## 2015-02-05 DIAGNOSIS — H40001 Preglaucoma, unspecified, right eye: Secondary | ICD-10-CM | POA: Diagnosis present

## 2015-10-22 DIAGNOSIS — F330 Major depressive disorder, recurrent, mild: Secondary | ICD-10-CM | POA: Diagnosis present

## 2016-06-21 ENCOUNTER — Other Ambulatory Visit: Payer: Self-pay | Admitting: Nephrology

## 2016-06-21 DIAGNOSIS — Z7682 Awaiting organ transplant status: Secondary | ICD-10-CM

## 2016-06-24 ENCOUNTER — Inpatient Hospital Stay
Admission: RE | Admit: 2016-06-24 | Discharge: 2016-06-24 | Disposition: A | Discharge status: Home / Self Care | Payer: PRIVATE HEALTH INSURANCE | Source: Ambulatory Visit | Attending: Nephrology | Admitting: Nephrology

## 2016-06-27 ENCOUNTER — Ambulatory Visit
Admission: RE | Admit: 2016-06-27 | Discharge: 2016-06-27 | Disposition: A | Discharge status: Home / Self Care | Payer: 59 | Source: Ambulatory Visit | Attending: Nephrology | Admitting: Nephrology

## 2016-06-27 DIAGNOSIS — Z7682 Awaiting organ transplant status: Secondary | ICD-10-CM

## 2016-07-01 ENCOUNTER — Other Ambulatory Visit: Payer: Self-pay | Admitting: Nephrology

## 2016-07-01 DIAGNOSIS — Z7682 Awaiting organ transplant status: Secondary | ICD-10-CM

## 2016-07-06 ENCOUNTER — Ambulatory Visit (HOSPITAL_COMMUNITY): Payer: 59 | Attending: Cardiovascular Disease

## 2016-07-06 ENCOUNTER — Other Ambulatory Visit: Payer: Self-pay

## 2016-07-06 DIAGNOSIS — Z7682 Awaiting organ transplant status: Secondary | ICD-10-CM | POA: Diagnosis not present

## 2016-07-14 ENCOUNTER — Telehealth (HOSPITAL_COMMUNITY): Payer: Self-pay | Admitting: *Deleted

## 2016-07-14 NOTE — Telephone Encounter (Signed)
Left message on voicemail in reference to upcoming appointment scheduled for 07/19/16. Phone number given for a call back so details instructions can be given. Daniel Hill

## 2016-07-19 ENCOUNTER — Other Ambulatory Visit (HOSPITAL_COMMUNITY): Payer: 59

## 2016-07-20 ENCOUNTER — Telehealth (HOSPITAL_COMMUNITY): Payer: Self-pay | Admitting: Nephrology

## 2016-07-20 NOTE — Telephone Encounter (Signed)
Pt cancelled appt for 3/27 Patient (Per pt no point in having procedure done)

## 2016-10-11 ENCOUNTER — Other Ambulatory Visit: Payer: Self-pay | Admitting: Gastroenterology

## 2016-10-11 DIAGNOSIS — R112 Nausea with vomiting, unspecified: Secondary | ICD-10-CM

## 2016-10-20 ENCOUNTER — Ambulatory Visit
Admission: RE | Admit: 2016-10-20 | Discharge: 2016-10-20 | Disposition: A | Discharge status: Home / Self Care | Payer: 59 | Source: Ambulatory Visit | Attending: Gastroenterology | Admitting: Gastroenterology

## 2016-10-20 DIAGNOSIS — R112 Nausea with vomiting, unspecified: Secondary | ICD-10-CM

## 2017-06-26 ENCOUNTER — Encounter: Payer: Self-pay | Admitting: Neurology

## 2017-06-27 ENCOUNTER — Telehealth: Payer: Self-pay | Admitting: Neurology

## 2017-06-27 ENCOUNTER — Institutional Professional Consult (permissible substitution): Payer: Self-pay | Admitting: Neurology

## 2017-06-27 NOTE — Telephone Encounter (Signed)
Pt no showed to apt today 

## 2017-06-28 ENCOUNTER — Encounter: Payer: Self-pay | Admitting: Neurology

## 2017-09-20 DIAGNOSIS — N183 Chronic kidney disease, stage 3 unspecified: Secondary | ICD-10-CM | POA: Diagnosis present

## 2018-03-21 ENCOUNTER — Ambulatory Visit (INDEPENDENT_AMBULATORY_CARE_PROVIDER_SITE_OTHER): Payer: 59 | Admitting: Internal Medicine

## 2018-03-21 ENCOUNTER — Encounter: Payer: Self-pay | Admitting: Internal Medicine

## 2018-03-21 VITALS — BP 110/80 | HR 78 | Ht 69.0 in | Wt 207.0 lb

## 2018-03-21 DIAGNOSIS — E038 Other specified hypothyroidism: Secondary | ICD-10-CM | POA: Diagnosis not present

## 2018-03-21 DIAGNOSIS — IMO0002 Reserved for concepts with insufficient information to code with codable children: Secondary | ICD-10-CM | POA: Insufficient documentation

## 2018-03-21 DIAGNOSIS — E10319 Type 1 diabetes mellitus with unspecified diabetic retinopathy without macular edema: Secondary | ICD-10-CM | POA: Diagnosis not present

## 2018-03-21 DIAGNOSIS — E1065 Type 1 diabetes mellitus with hyperglycemia: Secondary | ICD-10-CM | POA: Diagnosis not present

## 2018-03-21 DIAGNOSIS — E063 Autoimmune thyroiditis: Secondary | ICD-10-CM | POA: Diagnosis not present

## 2018-03-21 LAB — POCT GLYCOSYLATED HEMOGLOBIN (HGB A1C): Hemoglobin A1C: 8.9 % — AB (ref 4.0–5.6)

## 2018-03-21 MED ORDER — LEVOTHYROXINE SODIUM 75 MCG PO TABS: 75.0000 ug | ORAL_TABLET | Freq: Every day | ORAL | 3 refills | Status: DC

## 2018-03-21 MED ORDER — GLUCAGON 3 MG/DOSE NA POWD: 3.0000 mg | Freq: Once | NASAL | 11 refills | Status: DC | PRN

## 2018-03-21 NOTE — Patient Instructions (Signed)
Please continue: - Tresiba 10 units daily - Humalog - ICR 1:15, target 120, ISF 40 - try to inject at the beginning of the meal  Please schedule an appt with Antonieta Iba with nutrition.  Please schedule an appt with Leonia Reader for diabetes education.  Please return in 1.5 months with your sugar log.

## 2018-03-21 NOTE — Progress Notes (Signed)
Patient ID: Daniel Hill, male   DOB: 1976-01-05, 42 y.o.   MRN: [id number]   HPI: Daniel Hill is a 42 y.o.-year-old male, referred by his nephrologist, Dr. Jimmy Footman, for management of DM1, uncontrolled, with complications (DKA, mild gastroparesis, proliferative retinopathy, ESRD -history of being on HD and peritoneal dialysis for 5 years, PN).  DM1: Patient has been diagnosed with diabetes at 42 y/o. She was prev. Seen by [facility] endocrinology.  She is status post kidney-pancreatic transplant in 2003, but he is pancreas transplant failed in 2013.  He is considered too high risk for a new pancreatic transplant.  His retinopathy and gastroparesis improved after transplant.  He had several eye surgeries (vitrectomy and scleral buckle procedures).  He has chronic blindness in his left eye.  Last hemoglobin A1c was: 07/15/2016: HbA1c 7.8% Lab Results  Component Value Date   HGBA1C 9.9 (H) 05/19/2011   He is on the following regimen: - Tresiba 10 units at bedtime - Humalog - ICR 1:15, target 120, ISF 40 - injects after meals (ends up with 2-6 units per meal)  He is interested in an insulin pump and he would like to possibly start before the end of the year due to insurance.  Meter: Freestyle Libre  Pt checks his sugars >4x a day (up to 10) with his freestyle libre CGM  Freestyle Libre CGM parameters: - average: 201+/-70 0.9 - coefficient of variation: 35.3% (19-25%) - time in range:  - low (<70): 0% - normal (70-180): 41% - high (>180): 59%    Lowest sugar was 40-50s in last 3 mo; he has hypoglycemia awareness at 60-70. No previous hypoglycemia admission. No glucagon kit at home. Highest sugar wasHI. + DKA admissions - latest: before his transplant.    Pt's meals are: - Breakfast: eggs or oatmeal - Lunch: meat and veggies, sandwich, soup - Dinner: mostly meat and veggies, sometimes pasta and bread - Snacks: 2  - usually apples or beef jerky.  -+ History of ESRD - Dr.  Jimmy Footman, last BUN/creatinine:  02/2018: Cr 0.8 Lab Results  Component Value Date   BUN 15 05/20/2011   BUN 20 05/19/2011   CREATININE 0.78 05/20/2011   CREATININE 0.93 05/19/2011   - + HL; last set of lipids: No results found for: CHOL, HDL, LDLCALC, LDLDIRECT, TRIG, CHOLHDL   - last eye exam was in 2018. + DR.   -+ Numbness and tingling in his feet.  He was on Reglan >> intolerance.  Pt has FH of DM in father.  Hashimoto's thyroiditis:  Pt is on levothyroxine 75 Mcg daily, taken: - in am - fasting - at least 30 min from b'fast - no Ca, Fe, MVI, PPIs - not on Biotin  Last TSH: Normal reportedly No results found for: TSH  He does have a family history of thyroid cancer.  On vitamin D supplement.   ROS: Constitutional: no weight gain, no weight loss, no fatigue, no subjective hyperthermia, no subjective hypothermia, no nocturia Eyes: no blurry vision, no xerophthalmia ENT: no sore throat, no nodules palpated in neck, no dysphagia, no odynophagia, no hoarseness, no tinnitus, no hypoacusis Cardiovascular: no CP, no SOB, no palpitations, no leg swelling Respiratory: no cough, no SOB, no wheezing Gastrointestinal: no N, no V, no D, no C, no acid reflux Musculoskeletal: no muscle, no joint aches Skin: no rash, no hair loss Neurological: no tremors, no numbness or tingling/no dizziness/no HAs Psychiatric: + depression, + anxiety  Past Medical History:  Diagnosis Date  .  Anxiety   . Depression   . Diabetes mellitus   . GERD (gastroesophageal reflux disease)   . Hypothyroidism   . Kidney replaced by transplant   . Pancreas transplanted ([place])   . Thyroid disease    Past Surgical History:  Procedure Laterality Date  . EYE SURGERY     Social History   Socioeconomic History  . Marital status: Married    Spouse name: Not on file  . children: yes  . Years of education: Not on file  . 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 Smoking status: Never Smoker  . Smokeless tobacco: Never Used  Substance and Sexual Activity  . Alcohol use:  Beer very seldom  . Drug use: No   Current Outpatient Medications on File Prior to Visit  Medication Sig Dispense Refill  . insulin lispro (HUMALOG KWIKPEN) 100 UNIT/ML KwikPen Humalog KwikPen (U-100) Insulin 100 unit/mL subcutaneous  INJECT1 UNIT PER 15 GRAMS OF CARBS BEFORE MEALS PLUS SSI AS NEEDED 30 UNITS DAILY    . Multiple Vitamins-Minerals (MULTIVITAMINS THER. W/MINERALS) TABS Take 1 tablet by mouth daily.    . tacrolimus (PROGRAF) 1 MG capsule Take 3 mg by mouth 2 (two) times daily.    . TRESIBA FLEXTOUCH 100 UNIT/ML SOPN FlexTouch Pen INJECT 10 UNITS DAILY  6  . vitamin C (ASCORBIC ACID) 500 MG tablet Take 500 mg by mouth daily.    Marland Kitchen HYDROcodone-homatropine (HYCODAN) 5-1.5 MG/5ML syrup Take 5 mLs by mouth every 6 (six) hours as needed. For cough     No current facility-administered medications on file prior to visit.     Allergies  Allergen Reactions  . Codeine Nausea And Vomiting  . Metoclopramide Hcl Nausea And Vomiting   Family History  Problem Relation Age of Onset  . Stroke Mother   . Lymphoma Father   . Diabetes type II Father     PE: BP 110/80   Pulse 78   Ht _0  (1.753 m)   Wt 207 lb (93.9 kg)   SpO2 98%   BMI 30.57 kg/m  Wt Readings from Last 3 Encounters:  03/21/18 207 lb (93.9 kg)  05/19/11 230 lb 2.6 oz (104.4 kg)   Constitutional: overweight, in NAD Eyes: PERRLA, EOMI, no exophthalmos ENT: moist mucous membranes, no thyromegaly, no cervical lymphadenopathy Cardiovascular: RRR, No MRG Respiratory: CTA B Gastrointestinal: abdomen soft, NT, ND, BS+ Musculoskeletal: no deformities, strength intact in all 4 Skin: moist, warm, no rashes Neurological: + tremor with outstretched hands, DTR normal in all 4  ASSESSMENT: 1. DM1, uncontrolled, without complications - h/o DKA - mild gastroparesis -  proliferative retinopathy - ESRD -history of being on HD and peritoneal dialysis for 5 years - PN  2. Hashimoto's thyroiditis  PLAN:  1. Patient with long-standing, uncontrolled DM1, on basal-bolus insulin regimen after he is failed pancreatic transplant.  Fortunately, his kidney transplant is still functioning well.   -His sugars are high, at most times of the day, with occasional lows at night.  He feels the lows every time and is able to wake up and correct them.  Reviewing his CGM tracings, the sugars increase after breakfast, lunch, and dinner, and they decreased precipitously after breakfast and dinner.  Upon questioning, patient is taking his rapid acting insulin after meals, due to his mild gastroparesis.  I suggested to try to do the boluses at the start of the meal.  Also,  as his sugars increase after a meal, he may correct the high blood sugars with subsequent abrupt drops.  I hope that moving the boluses at the start of the meal will avoid the need for postprandial correction, and therefore avoid blood sugars drops late after meals. -We also discussed about the fact the Tyler Aas is a great option for him compared to Lantus, since it offers more stable blood sugars and less propensity for hypoglycemia.  We will continue this and also the Humalog. -We did also discuss about improving his meals by reducing fatty meals.  He is already trying to reduce carbs. -He is very interested to start the pump as soon as possible.  I explained that I usually want to document compliance with recommended treatment and with visit before starting the proceedings for a pump, but in his case, I went ahead and referred him to nutrition for carb counting refresher and to diabetes educator for pump training and will try to get him on the pump as soon as possible.   - We discussed about changes to his insulin regimen, as follows:  Patient Instructions  Please continue: - Tresiba 10 units daily - Humalog - ICR 1:15,  target 120, ISF 40 - try to inject at the beginning of the meal  Please schedule an appt with Antonieta Iba with nutrition.  Please schedule an appt with Leonia Reader for diabetes education.  Please return in 1.5 months with your sugar log.   - Continue checking sugars at different times of the day - check at least 4 times a day, rotating checks.  We discussed that in the setting of type 1 diabetes especially with history of hypoglycemia, freestyle libre CGM is not ideal, since it does not give alerts.  Changing to an insulin pump with integrated CGM would be ideal for him. - sent glucagon kit Rx to pharmacy - He has Glu tablets with him - advised for a Med-alert bracelet mentioning "type 1 diabetes mellitus". - Return to clinic in 1.5 mo with sugar log   2. Hashimoto's thyroiditis - No TSH available for review - he continues on LT4 75 mcg daily - pt feels good on this dose. - we discussed about taking the thyroid hormone every day, with water, >30 minutes before breakfast, separated by >4 hours from acid reflux medications, calcium, iron, multivitamins. Pt. is taking it correctly. - will check thyroid tests at next visit. He mentions that usually Dr. Jimmy Footman checks theses >> will ask for records.  Philemon Kingdom, MD PhD [facility] Endocrinology

## 2018-03-28 ENCOUNTER — Encounter: Payer: 59 | Attending: Internal Medicine | Admitting: Nutrition

## 2018-03-28 DIAGNOSIS — E10319 Type 1 diabetes mellitus with unspecified diabetic retinopathy without macular edema: Secondary | ICD-10-CM | POA: Insufficient documentation

## 2018-03-28 DIAGNOSIS — IMO0002 Reserved for concepts with insufficient information to code with codable children: Secondary | ICD-10-CM

## 2018-03-28 DIAGNOSIS — E1065 Type 1 diabetes mellitus with hyperglycemia: Secondary | ICD-10-CM | POA: Insufficient documentation

## 2018-03-29 NOTE — Patient Instructions (Signed)
Go on line to view all three pumps and call if questions.

## 2018-03-29 NOTE — Progress Notes (Signed)
Patient is wanting to go on an insulin pump.  We discussed how pump works and he was shown the 3 different pumps.  We discussed the advantages/disadvantages of each model and he chose the OmniPod.  Paperwork was filled out and faxed.  He will call me when it comes in to schedule a training date.

## 2018-04-04 ENCOUNTER — Other Ambulatory Visit: Payer: Self-pay

## 2018-04-04 MED ORDER — OMNIPOD DASH PODS (GEN 4) MISC: 6.0000 | 3 refills | Status: DC

## 2018-04-09 ENCOUNTER — Telehealth: Payer: Self-pay | Admitting: Nutrition

## 2018-04-09 NOTE — Telephone Encounter (Signed)
Appointment scheduled for Wednesday morning

## 2018-04-11 ENCOUNTER — Encounter: Payer: 59 | Admitting: Nutrition

## 2018-04-11 DIAGNOSIS — E1065 Type 1 diabetes mellitus with hyperglycemia: Principal | ICD-10-CM

## 2018-04-11 DIAGNOSIS — E10319 Type 1 diabetes mellitus with unspecified diabetic retinopathy without macular edema: Secondary | ICD-10-CM

## 2018-04-11 DIAGNOSIS — IMO0002 Reserved for concepts with insufficient information to code with codable children: Secondary | ICD-10-CM

## 2018-04-12 ENCOUNTER — Ambulatory Visit: Payer: 59 | Admitting: Dietician

## 2018-04-13 ENCOUNTER — Telehealth: Payer: Self-pay | Admitting: Nutrition

## 2018-04-13 NOTE — Telephone Encounter (Signed)
Agree! Thank you, Vaughan Basta!

## 2018-04-13 NOTE — Telephone Encounter (Signed)
Patient reported no difficulty giving boluses for lunch at 12PM.  Said blood sugar 2hr. pcL was 167, but that he dropped to 57 at 7PM.  He did not bolus for supper, and blood sugar was now (9PM only 125).  Patient reported that he did not want to drop low tonight. Basal rate was lowered at 4PM from .3 to .15u/hr until 5AM.

## 2018-04-16 NOTE — Progress Notes (Signed)
Patient was trained on how to use the OmniPod insulin pump.  Settings were put in by the patient per Dr. Chrissie Noa orders: Basal rate: 0.3, , I/C ratio: 15, ISF: 40. Timing: 4 hours, target: 110 with correction over 120, and reverse correction is on. Pt. Re demonstrated how to bolus X3 correctly.  We reviewed alerts and alarms and how to fill the pod.  He reported knowing how to carb count, and was encouraged to use the Calorie [employer] Pace app for this.   He filled the pod with 95u of Humalog insulin, and applied it to his upper outer left arm with very little assistance from me.  He was told to test his blood sugars ac, 2hr. pc and HS.  He agreed to do this.He was also told to call the office if blood sugars remain high, or drop low today.  He agreed to do this.  I will call him tonight to see how he is doing. He had no final questions.

## 2018-04-16 NOTE — Patient Instructions (Signed)
Read over Resource manual Test blood sugars before meals, 2hr. After meals and at bedtime Call 800 help line if questions about how to use the pump. Call office if blood sugar readings go over 250, or drop below 70.

## 2018-04-27 ENCOUNTER — Telehealth: Payer: Self-pay | Admitting: Internal Medicine

## 2018-04-27 MED ORDER — INSULIN LISPRO 100 UNIT/ML ~~LOC~~ SOLN: SUBCUTANEOUS | 3 refills | Status: DC

## 2018-04-27 NOTE — Telephone Encounter (Signed)
RX sent

## 2018-04-27 NOTE — Telephone Encounter (Signed)
MEDICATION: Humalog vial for pump  PHARMACY:  [facility] [address] [place]  IS THIS A 90 DAY SUPPLY : unsure  IS PATIENT OUT OF MEDICATION: no  IF NOT; HOW MUCH IS LEFT: 1-2 days worth   LAST APPOINTMENT DATE: @11 /27/2019  NEXT APPOINTMENT DATE:@1 /11/2018  DO WE HAVE YOUR PERMISSION TO LEAVE A DETAILED MESSAGE:  OTHER COMMENTS:    **Let patient know to contact pharmacy at the end of the day to make sure medication is ready. **  ** Please notify patient to allow 48-72 hours to process**  **Encourage patient to contact the pharmacy for refills or they can request refills through [facility]**

## 2018-05-02 ENCOUNTER — Encounter: Payer: 59 | Attending: Internal Medicine | Admitting: Nutrition

## 2018-05-02 ENCOUNTER — Ambulatory Visit (INDEPENDENT_AMBULATORY_CARE_PROVIDER_SITE_OTHER): Payer: 59 | Admitting: Internal Medicine

## 2018-05-02 ENCOUNTER — Encounter: Payer: Self-pay | Admitting: Internal Medicine

## 2018-05-02 VITALS — BP 118/80 | HR 85 | Ht 69.0 in | Wt 207.0 lb

## 2018-05-02 DIAGNOSIS — E038 Other specified hypothyroidism: Secondary | ICD-10-CM

## 2018-05-02 DIAGNOSIS — E1065 Type 1 diabetes mellitus with hyperglycemia: Secondary | ICD-10-CM | POA: Insufficient documentation

## 2018-05-02 DIAGNOSIS — E063 Autoimmune thyroiditis: Secondary | ICD-10-CM | POA: Diagnosis not present

## 2018-05-02 DIAGNOSIS — IMO0002 Reserved for concepts with insufficient information to code with codable children: Secondary | ICD-10-CM

## 2018-05-02 DIAGNOSIS — E10319 Type 1 diabetes mellitus with unspecified diabetic retinopathy without macular edema: Secondary | ICD-10-CM | POA: Insufficient documentation

## 2018-05-02 LAB — COMPLETE METABOLIC PANEL WITH GFR
AG RATIO: 1.2 (calc) (ref 1.0–2.5)
ALBUMIN MSPROF: 3.9 g/dL (ref 3.6–5.1)
ALT: 14 U/L (ref 9–46)
AST: 15 U/L (ref 10–40)
Alkaline phosphatase (APISO): 106 U/L (ref 40–115)
BUN: 16 mg/dL (ref 7–25)
CALCIUM: 9.6 mg/dL (ref 8.6–10.3)
CO2: 26 mmol/L (ref 20–32)
Chloride: 102 mmol/L (ref 98–110)
Creat: 0.97 mg/dL (ref 0.60–1.35)
GFR, EST AFRICAN AMERICAN: 110 mL/min/{1.73_m2} (ref >=60)
GFR, EST NON AFRICAN AMERICAN: 95 mL/min/{1.73_m2} (ref >=60)
GLUCOSE: 281 mg/dL — AB (ref 65–99)
Globulin: 3.2 g/dL (calc) (ref 1.9–3.7)
Potassium: 5.2 mmol/L (ref 3.5–5.3)
Sodium: 136 mmol/L (ref 135–146)
TOTAL PROTEIN: 7.1 g/dL (ref 6.1–8.1)
Total Bilirubin: 0.4 mg/dL (ref 0.2–1.2)

## 2018-05-02 LAB — LIPID PANEL
Cholesterol: 150 mg/dL (ref 0–200)
HDL: 37.4 mg/dL — ABNORMAL LOW (ref >39.00)
LDL Cholesterol: 96 mg/dL (ref 0–99)
NonHDL: 112.44
Total CHOL/HDL Ratio: 4
Triglycerides: 83 mg/dL (ref 0.0–149.0)
VLDL: 16.6 mg/dL (ref 0.0–40.0)

## 2018-05-02 LAB — T4, FREE: Free T4: 1.13 ng/dL (ref 0.60–1.60)

## 2018-05-02 LAB — TSH: TSH: 1.83 u[IU]/mL (ref 0.35–4.50)

## 2018-05-02 NOTE — Patient Instructions (Signed)
Please change the pump settings as follows: - basal rates: 12 am: 0.5 units/h >> 0.8 (please increase further to 1.0 units/h if still needed after 3 to 4 days) 5 am: 0.7 >> 1.0 (please increase further to 1.2 units/h if still needed after 3 to 4 days) - ICR: 1:15 >> 1: 12 (please increase further to 1:10 if still needed after 3 to 4 days) - target: 110-120 - ISF: 40 >> 30 - Insulin on Board: 4h  Please stop at the lab.  Please upload your pump again in 3 weeks.  Please return in 1.5 months.

## 2018-05-02 NOTE — Progress Notes (Signed)
Patient ID: Daniel Hill, male   DOB: 10-Jul-1975, 43 y.o.   MRN: [id number]   HPI: Daniel Hill is a 43 y.o.-year-old male, initially referred by his nephrologist, Dr. Jimmy Footman, for management of DM1, uncontrolled, with complications (DKA, mild gastroparesis, proliferative retinopathy, ESRD -history of being on HD and peritoneal dialysis for 5 years, PN). Last OV 1.5 mo ago.  He had bronchitis >> on ABx, just finished the course yesterday.  He also had a steroid shot recently.  He is stressed as they are remodeling the house.  Sugars are high throughout the day, even in the 400s.  There is no consistent pattern.  DM1: Patient has been diagnosed with diabetes at 43 y/o. She was prev. Seen by [facility] endocrinology.  She is status post kidney-pancreatic transplant in 2003, but he is pancreas transplant failed in 2013.  He is considered too high risk for a new pancreatic transplant.  His retinopathy and gastroparesis improved after transplant.  He had several eye surgeries (vitrectomy and scleral buckle procedures).  He has chronic blindness in his left eye.  Last hemoglobin A1c was: Lab Results  Component Value Date   HGBA1C 8.9 (A) 03/21/2018   HGBA1C 9.9 (H) 05/19/2011  07/15/2016: HbA1c 7.8%  He was on the following regimen: - Tresiba 10 units at bedtime - Humalog - ICR 1:15, target 120, ISF 40 - injects after meals (ends up with 2-6 units per meal)  Now on: Omnipod pump with Humalog + Libre CGM >> but would like to switch to t"slim Pump settings: - basal rates: 12 am: 0.5 units/h 5 am: 0.7 TDD from basal insulin: ~11 units - ICR: 1:15 - target: 110-120 - ISF: 40 - Insulin on Board: 4h - bolus wizard: on TDD from bolus insulin: ~21 units - changes infusion site: q3 days  Lowest sugar was 40-50s >> 96; he has hypoglycemia awareness at 60-70. No previous hypoglycemia admission. No glucagon kit at home. Highest sugar was HI >> 400s.  He had DKA admissions in the past but the latest  was before his transplant.  Pt's meals are: - Breakfast: eggs or oatmeal - Lunch: meat and veggies, sandwich, soup - Dinner: mostly meat and veggies, sometimes pasta and bread - Snacks: 2  - usually apples or beef jerky.  -+ History of ESRD - Dr. Jimmy Footman, last BUN/creatinine:  02/2018: Cr 0.8 Lab Results  Component Value Date   BUN 15 05/20/2011   BUN 20 05/19/2011   CREATININE 0.78 05/20/2011   CREATININE 0.93 05/19/2011   - + HL; last set of lipids: No results found for: CHOL, HDL, LDLCALC, LDLDIRECT, TRIG, CHOLHDL   - last eye exam was in 2018>> + DR.   -+ Numbness and tingling in his feet.  He was on Reglan >> intolerance.  Pt has FH of DM in father.  Hashimoto's thyroiditis:  Pt is on levothyroxine 75 mcg daily, taken: - in am - fasting - at least 30 min from b'fast - no Ca, Fe, MVI, PPIs - not on Biotin  Last TSH: Normal reportedly No results found for: TSH  He does have a family history of thyroid cancer.  He takes a vitamin D supplement.    ROS: Constitutional: + weight gain/no weight loss, no fatigue, no subjective hyperthermia, no subjective hypothermia Eyes: no blurry vision, no xerophthalmia ENT: + sore throat, no nodules palpated in neck, no dysphagia, no odynophagia, no hoarseness Cardiovascular: no CP/no SOB/no palpitations/no leg swelling Respiratory: no cough/no SOB/no wheezing Gastrointestinal: no  N/no V/no D/no C/no acid reflux Musculoskeletal: no muscle aches/no joint aches Skin: no rashes, no hair loss Neurological: no tremors/no numbness/no tingling/no dizziness  I reviewed pt's medications, allergies, PMH, social hx, family hx, and changes were documented in the history of present illness. Otherwise, unchanged from my initial visit note.  Past Medical History:  Diagnosis Date  . Anxiety   . Depression   . Diabetes mellitus   . GERD (gastroesophageal reflux disease)   . Hypothyroidism   . Kidney replaced by transplant   .  Pancreas transplanted ([place])   . Thyroid disease    Past Surgical History:  Procedure Laterality Date  . EYE SURGERY     Social History   Socioeconomic History  . Marital status: Married    Spouse name: Not on file  . children: yes  . Years of education: Not on file  . 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 Smoking status: Never Smoker  . Smokeless tobacco: Never Used  Substance and Sexual Activity  . Alcohol use:  Beer very seldom  . Drug use: No   Current Outpatient Medications on File Prior to Visit  Medication Sig Dispense Refill  . Glucagon (BAQSIMI TWO PACK) 3 MG/DOSE POWD Place 3 mg into the nose once as needed for up to 1 dose. 1 each 11  . HYDROcodone-homatropine (HYCODAN) 5-1.5 MG/5ML syrup Take 5 mLs by mouth every 6 (six) hours as needed. For cough    . Insulin Disposable Pump (OMNIPOD DASH 5 PACK) MISC 6 Packages by Does not apply route every 3 (three) days. 30 each 3  . insulin lispro (HUMALOG KWIKPEN) 100 UNIT/ML KwikPen Humalog KwikPen (U-100) Insulin 100 unit/mL subcutaneous  INJECT1 UNIT PER 15 GRAMS OF CARBS BEFORE MEALS PLUS SSI AS NEEDED 30 UNITS DAILY    . insulin lispro (HUMALOG) 100 UNIT/ML injection Use up to 100 units per day in insulin pump 30 mL 3  . levothyroxine (SYNTHROID, LEVOTHROID) 75 MCG tablet Take 1 tablet (75 mcg total) by mouth daily. 90 tablet 3  . Multiple Vitamins-Minerals (MULTIVITAMINS THER. W/MINERALS) TABS Take 1 tablet by mouth daily.    . tacrolimus (PROGRAF) 1 MG capsule Take 3 mg by mouth 2 (two) times daily.    . TRESIBA FLEXTOUCH 100 UNIT/ML SOPN FlexTouch Pen INJECT 10 UNITS DAILY  6  . vitamin C (ASCORBIC ACID) 500 MG tablet Take 500 mg by mouth daily.     No current facility-administered medications on file prior to visit.     Allergies  Allergen Reactions  . Codeine Nausea And Vomiting  . Metoclopramide Hcl Nausea And Vomiting   Family History   Problem Relation Age of Onset  . Stroke Mother   . Lymphoma Father   . Diabetes type II Father     PE: BP 118/80   Pulse 85   Ht '5\' 9"'$  (1.753 m)   Wt 207 lb (93.9 kg)   SpO2 98%   BMI 30.57 kg/m  Wt Readings from Last 3 Encounters:  05/02/18 207 lb (93.9 kg)  03/21/18 207 lb (93.9 kg)  05/19/11 230 lb 2.6 oz (104.4 kg)   Constitutional: overweight, in NAD Eyes: PERRLA, EOMI, no exophthalmos ENT: moist mucous membranes, no thyromegaly, no cervical lymphadenopathy Cardiovascular: RRR, No MRG Respiratory: CTA B except mild wheezing B lung bases Gastrointestinal: abdomen soft, NT, ND, BS+ Musculoskeletal: no deformities, strength intact in all 4 Skin: moist, warm,  no rashes Neurological: + tremor with outstretched hands, DTR normal in all 4  ASSESSMENT: 1. DM1, uncontrolled, without complications - h/o DKA - mild gastroparesis - proliferative retinopathy - ESRD -history of being on HD and peritoneal dialysis for 5 years - PN  2. Hashimoto's thyroiditis  PLAN:  1. Patient with longstanding, uncontrolled, type 1 diabetes, on basal-bolus insulin regimen, after his failed pancreatic transplant.  Fortunately, his kidney transplant is still functioning well.  At last visit, 1.5 months ago, his sugars were high at most time of the day with occasional lows at night.  We discussed about further management and he really wanted to continue with switching to an insulin pump.  He had to have this before the end of the year.  He was able to start the Omni pod pump at the end of 03/2018.  He is not completely happy with it and would like upon that communicates to a Dexcom CGM.  As of now, he is using his freestyle libre CGM.  He is interested in the T slim Tandem insulin pump and he will try to work with the [employer] company to see if he may switch to this pump. -His sugars remain high at all times of the day, especially in the setting of his bronchitis, steroid shot and stress from working on  his house. -At this visit, we reviewed together his pump downloads - we will increase his basal rates, decrease his insulin to carb ratios and also decrease his sensitivity factors especially as he is feeling that he is not getting enough insulin for correction. -Also, we discussed about the need to check sugars, enter carbs, and start the boluses 15 minutes before every meal -He is comfortable with carb counting and tells me that he does read labels and uses the carb content catalog in the Omni pod -For now we will continue on the Omni pod and freestyle libre CGM until he can get the T slim pump -We will check annual labs today: CMP, lipids -I suggested to: Patient Instructions  Please change the pump settings as follows: - basal rates: 12 am: 0.5 units/h >> 0.8 (please increase further to 1.0 units/h if still needed after 3 to 4 days) 5 am: 0.7 >> 1.0 (please increase further to 1.2 units/h if still needed after 3 to 4 days) - ICR: 1:15 >> 1: 12 (please increase further to 1:10 if still needed after 3 to 4 days) - target: 110-120 - ISF: 40 >> 30 - Insulin on Board: 4h  Please stop at the lab.  Please upload your pump again in 3 weeks.  Please return in 1.5 months.  - continue checking sugars at different times of the day - check >4x a day, rotating checks - advised for yearly eye exams >> he is not UTD - Return to clinic in 3 mo with sugar log   2. Hashimoto's thyroiditis -No TSH available for review -Continues on levothyroxine 75 mcg daily - pt feels good on this dose. - we discussed about taking the thyroid hormone every day, with water, >30 minutes before breakfast, separated by >4 hours from acid reflux medications, calcium, iron, multivitamins. Pt. is taking it correctly. - will check thyroid tests today: TSH and fT4 - If labs are abnormal, he will need to return for repeat TFTs in 1.5 months  - time spent with the patient: 40 min, of which >50% was spent in reviewing his  pump downloads (we will scan them), discussing his hypo- and  hyper-glycemic episodes, reviewing previous labs and pump settings and developing a plan to avoid hypo- and hyper-glycemia.   Office Visit on 05/02/2018  Component Date Value Ref Range Status  . TSH 05/02/2018 1.83  0.35 - 4.50 uIU/mL Final  . Free T4 05/02/2018 1.13  0.60 - 1.60 ng/dL Final   Comment: Specimens from patients who are undergoing biotin therapy and /or ingesting biotin supplements may contain high levels of biotin.  The higher biotin concentration in these specimens interferes with this Free T4 assay.  Specimens that contain high levels  of biotin may cause false high results for this Free T4 assay.  Please interpret results in light of the total clinical presentation of the patient.    . Glucose, Bld 05/02/2018 281* 65 - 99 mg/dL Final   Comment: .            Fasting reference interval . For someone without known diabetes, a glucose value >125 mg/dL indicates that they may have diabetes and this should be confirmed with a follow-up test. .   . BUN 05/02/2018 16  7 - 25 mg/dL Final  . Creat 05/02/2018 0.97  0.60 - 1.35 mg/dL Final  . GFR, Est Non African American 05/02/2018 95  > OR = 60 mL/min/1.58m Final  . GFR, Est African American 05/02/2018 110  > OR = 60 mL/min/1.761mFinal  . BUN/Creatinine Ratio 0102/40/9735OT APPLICABLE  6 - 22 (calc) Final  . Sodium 05/02/2018 136  135 - 146 mmol/L Final  . Potassium 05/02/2018 5.2  3.5 - 5.3 mmol/L Final  . Chloride 05/02/2018 102  98 - 110 mmol/L Final  . CO2 05/02/2018 26  20 - 32 mmol/L Final  . Calcium 05/02/2018 9.6  8.6 - 10.3 mg/dL Final  . Total Protein 05/02/2018 7.1  6.1 - 8.1 g/dL Final  . Albumin 05/02/2018 3.9  3.6 - 5.1 g/dL Final  . Globulin 05/02/2018 3.2  1.9 - 3.7 g/dL (calc) Final  . AG Ratio 05/02/2018 1.2  1.0 - 2.5 (calc) Final  . Total Bilirubin 05/02/2018 0.4  0.2 - 1.2 mg/dL Final  . Alkaline phosphatase (APISO) 05/02/2018 106  40 - 115  U/L Final  . AST 05/02/2018 15  10 - 40 U/L Final  . ALT 05/02/2018 14  9 - 46 U/L Final  . Cholesterol 05/02/2018 150  0 - 200 mg/dL Final   ATP III Classification       Desirable:  < 200 mg/dL               Borderline High:  200 - 239 mg/dL          High:  > = 240 mg/dL  . Triglycerides 05/02/2018 83.0  0.0 - 149.0 mg/dL Final   Normal:  <150 mg/dLBorderline High:  150 - 199 mg/dL  . HDL 05/02/2018 37.40* >39.00 mg/dL Final  . VLDL 05/02/2018 16.6  0.0 - 40.0 mg/dL Final  . LDL Cholesterol 05/02/2018 96  0 - 99 mg/dL Final  . Total CHOL/HDL Ratio 05/02/2018 4   Final                  Men          Women1/2 Average Risk     3.4          3.3Average Risk          5.0          4.42X Average Risk  9.6          7.13X Average Risk          15.0          11.0                      . NonHDL 05/02/2018 112.44   Final   NOTE:  Non-HDL goal should be 30 mg/dL higher than patient's LDL goal (i.e. LDL goal of < 70 mg/dL, would have non-HDL goal of < 100 mg/dL)   Labs are normal, except high Glu Daniel Kingdom, MD PhD [facility] Endocrinology

## 2018-05-02 NOTE — Patient Instructions (Signed)
Continue to test blood sugar ac all meals and put that reading into the bolus calculator. Call if questions Set up a [employer] account and call us with the user name and password.

## 2018-05-02 NOTE — Progress Notes (Signed)
We reviewed the checklist and patient reported good understanding of all topics.  High blood sugar protocol and temp basal rates were reviewed as well as extended boluses.  He had no final questions.  He reports having bronchitis and needed a cortisone injection last Friday.  Blood sugars all high.   Changes were made to the settings by the patient will little help from me, per Dr. Arman Filter orders.  Basal rate: MN: 0.8, 5AM: 1.0,    I/C: 12, and ISF: 30.  We discussed what each one of theses setting adjustments will do for his blood sugar readings, and he reported good understanding and had no final questions

## 2018-05-04 ENCOUNTER — Ambulatory Visit: Payer: 59 | Admitting: Internal Medicine

## 2018-05-07 ENCOUNTER — Telehealth: Payer: Self-pay

## 2018-05-07 NOTE — Telephone Encounter (Signed)
-----   Message from Philemon Kingdom, MD sent at 05/03/2018  3:18 PM EST ----- [name], can you please call pt: Labs are normal, except high Glu

## 2018-05-07 NOTE — Telephone Encounter (Signed)
Notified patient.

## 2018-05-14 ENCOUNTER — Ambulatory Visit: Payer: 59 | Admitting: Dietician

## 2018-05-15 ENCOUNTER — Encounter: Payer: 59 | Admitting: Nutrition

## 2018-05-15 DIAGNOSIS — E10319 Type 1 diabetes mellitus with unspecified diabetic retinopathy without macular edema: Secondary | ICD-10-CM

## 2018-05-15 DIAGNOSIS — IMO0002 Reserved for concepts with insufficient information to code with codable children: Secondary | ICD-10-CM

## 2018-05-15 DIAGNOSIS — E1065 Type 1 diabetes mellitus with hyperglycemia: Principal | ICD-10-CM

## 2018-05-16 ENCOUNTER — Telehealth: Payer: Self-pay | Admitting: Nutrition

## 2018-05-16 NOTE — Telephone Encounter (Signed)
-----   Message from Philemon Kingdom, MD sent at 05/16/2018  1:26 PM EST ----- Vaughan Basta, We can increase the ICR with lunch a little, if you agree. Ty, C ----- Message ----- From: Ocie Doyne, RN Sent: 05/16/2018   1:24 PM EST To: Philemon Kingdom, MD

## 2018-05-16 NOTE — Patient Instructions (Signed)
Take what the pump says to take, or make a note of what the pump said to take, and what you took, and bring to Dr. Arman Filter office next week. Treat low blood sugars with 3-4 glucose tablets, 1/2 cup of juice or soda, wait 15 minutes, retest.  Do not look at sensor, this is not an accurate reading, when blood sugars are rising.

## 2018-05-16 NOTE — Telephone Encounter (Signed)
Patient was told to change his ISF to 50 and his basal rate from 3-6PM to 0.8.  Also that he continues to drop low after the meal, he will change is Carb ratio to 15.  He reverbalized all of these changes correctly, and had no final questions.

## 2018-05-16 NOTE — Telephone Encounter (Signed)
Patient was told to increase his I/C ratio from 12 to 15 to help with preventing the lows after lunch.  He reverbalized this correctly and said he knows how to change the times for his lunch time, and then revert back to 12 for supper.

## 2018-05-16 NOTE — Progress Notes (Signed)
Patient reported that his blood sugars are dropping low before supper, and believes it is due to "too much insulin for corrections", and "I am very much afraid of low blood sugars".  .  He reports that he has not been giving what the pump says to give, because he knows it will drop low. He says he changes his ISF from 35 to 48, and thinks this is still not enough.   Download shows blood sugars are dropping low 4X in last 2 weeks with corrections given by pump, and reduced insulin doses. Also, 7 correction boluses resulted in blood sugars in the 70s or below.   ISF changed from 35 to 40.  Explained to patient that we can not determine what needs changed, if he continues to make changes in his pump insulin doses without documentation.  He agreed to take what the pump says and then call the office if dropping low.  He is also treating lows with very high fat items, ie. Sausage biscuit, peanut butter, and chocolate.  We discussed the appropriate treatments--how much as well as what to take that works quickly.  He agreed to do this.   He will see Dr. Renne Crigler next week, and agreed to take what the pump tells him until then. He had no final questions.

## 2018-06-15 ENCOUNTER — Ambulatory Visit: Payer: 59 | Admitting: Internal Medicine

## 2018-06-17 IMAGING — CT CT ABD-PELV W/O CM
1 of 2 series · 15 of 32 positions shown, 19 images · non-contrast
Comparison: CT scan of December 27, 2013.

CLINICAL DATA: Awaiting pancreas transplant.

EXAM:
CT ABDOMEN AND PELVIS WITHOUT CONTRAST
TECHNIQUE: Multidetector CT imaging of the abdomen and pelvis was performed
following the standard protocol without IV contrast.

[Series 2: abd/pelvis w/(date) · axial · 0.72mm/px · z∈[-516,-61]mm · 15 of 101 slices shown, 19 images]
[im 5/101  soft-tissue]
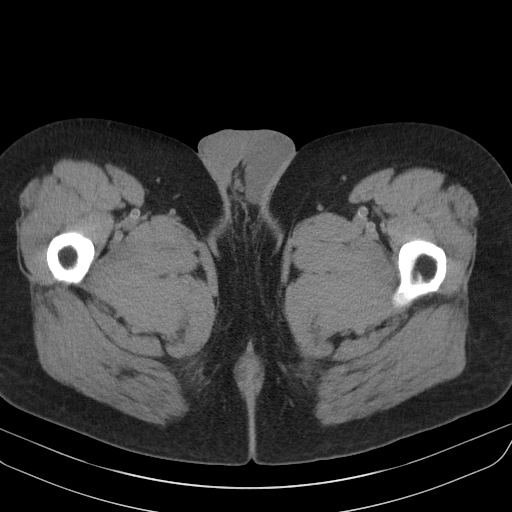
[im 5/101  bone]
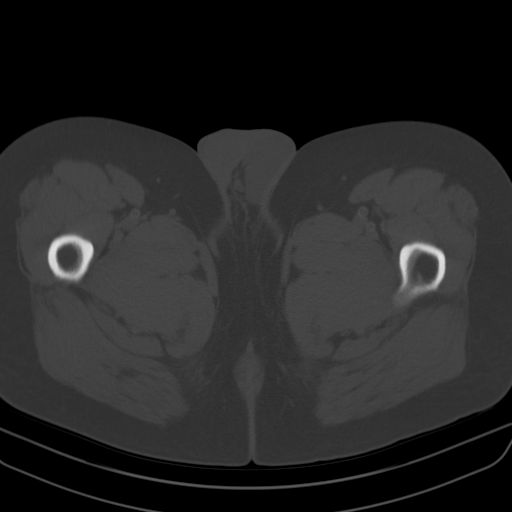
[im 13/101  soft-tissue]
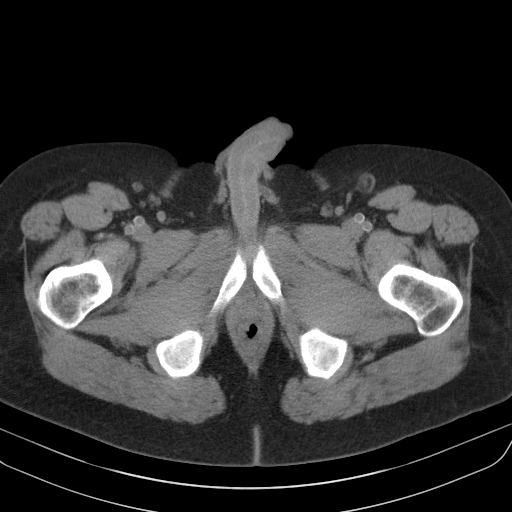
[im 21/101  soft-tissue]
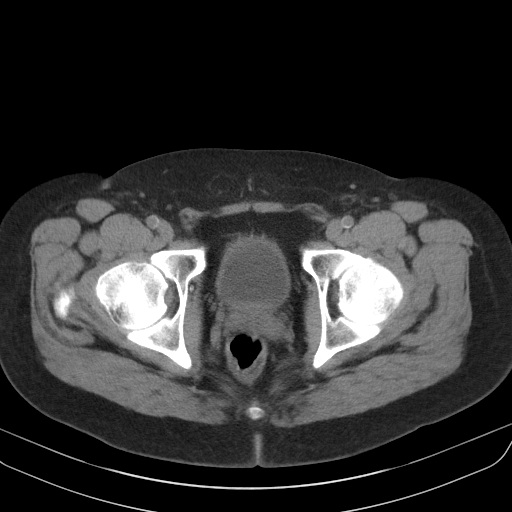
[im 30/101  soft-tissue]
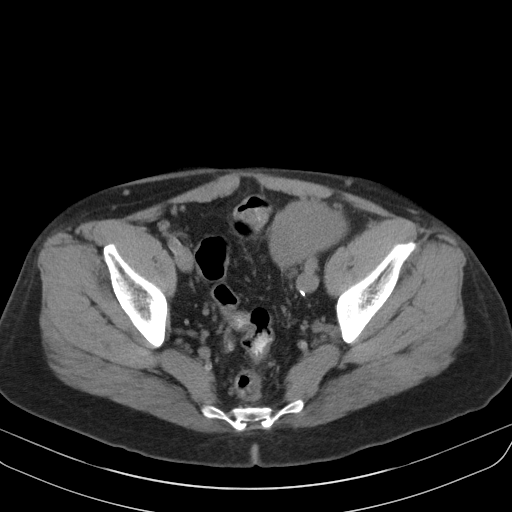
[im 34/101  soft-tissue]
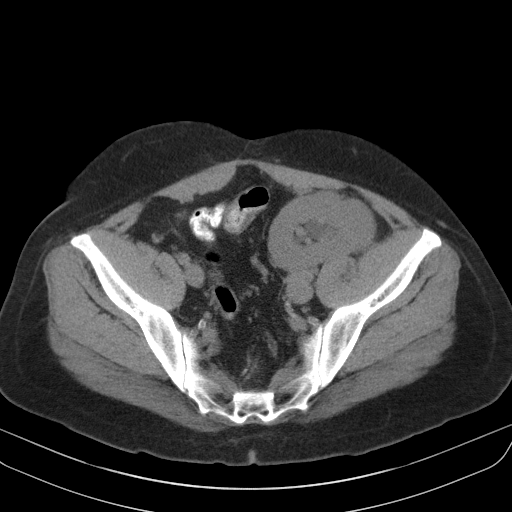
[im 42/101  soft-tissue]
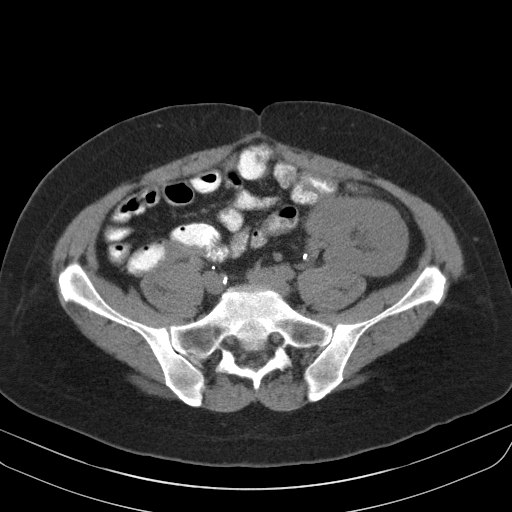
[im 51/101  soft-tissue]
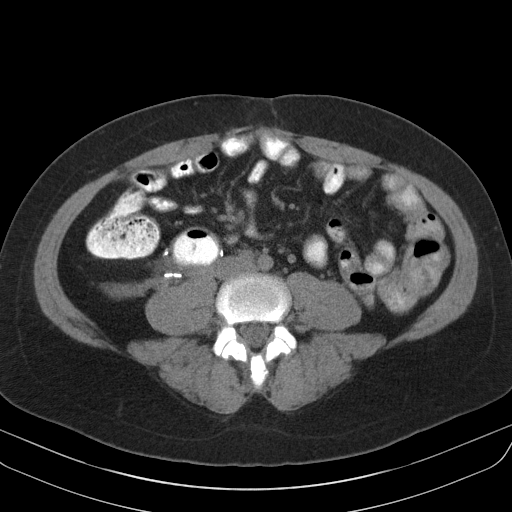
[im 59/101  soft-tissue]
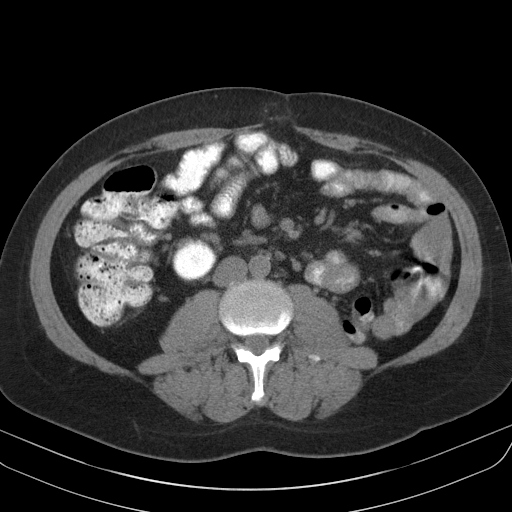
[im 67/101  soft-tissue]
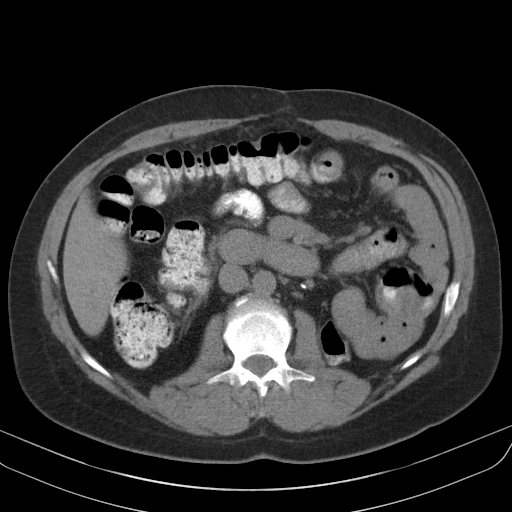
[im 67/101  bone]
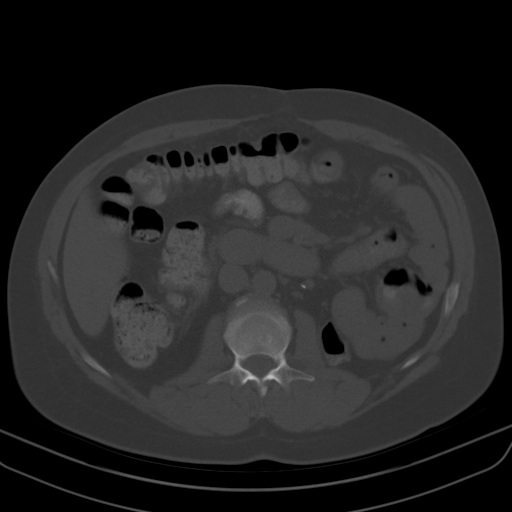
[im 71/101  soft-tissue]
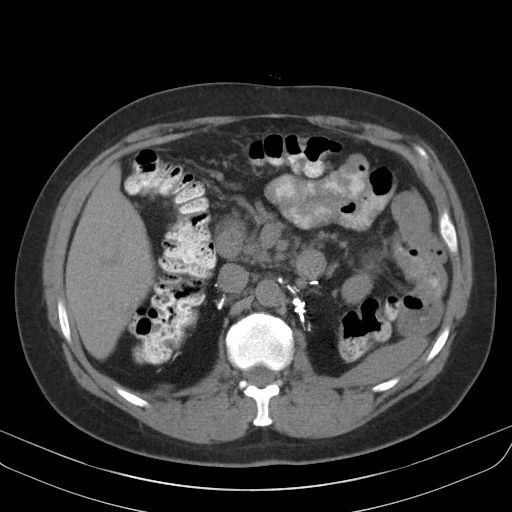
[im 80/101  soft-tissue]
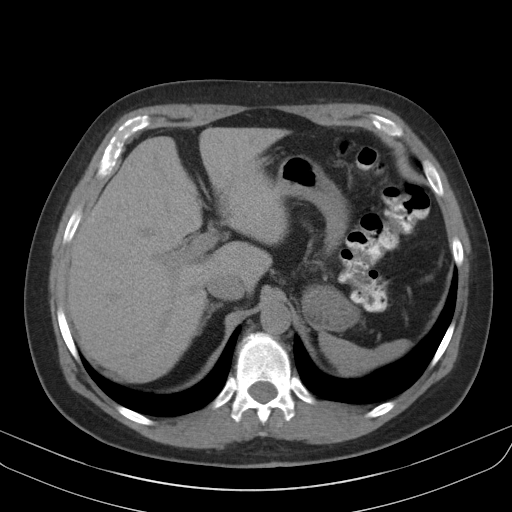
[im 84/101  lung]
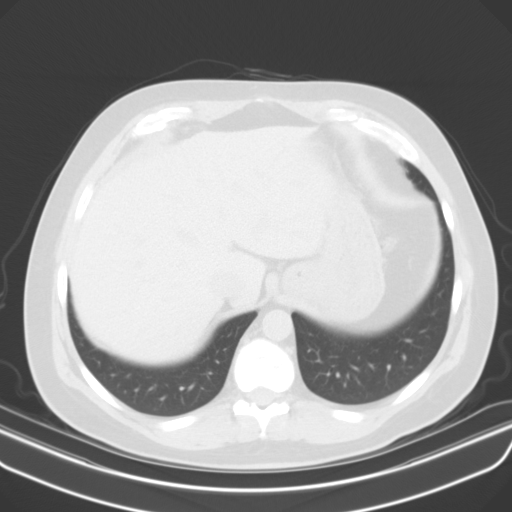
[im 88/101  soft-tissue]
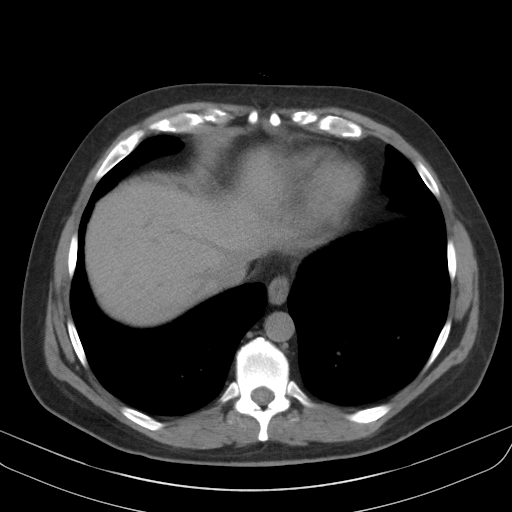
[im 88/101  lung]
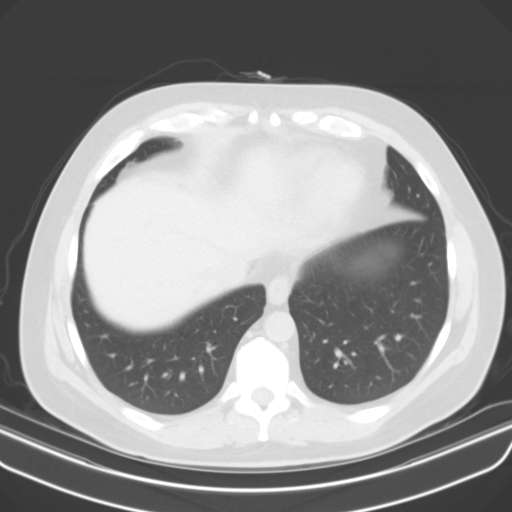
[im 92/101  lung]
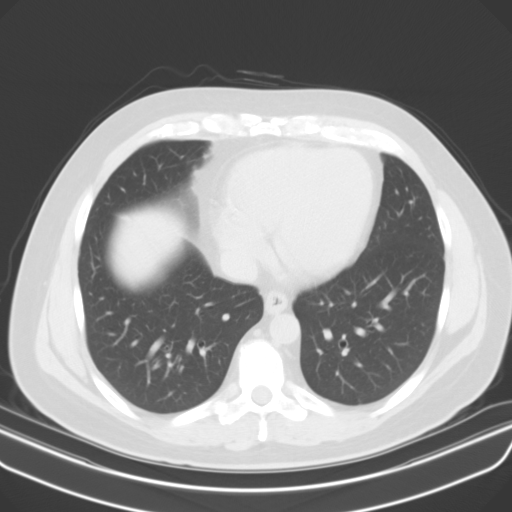
[im 96/101  soft-tissue]
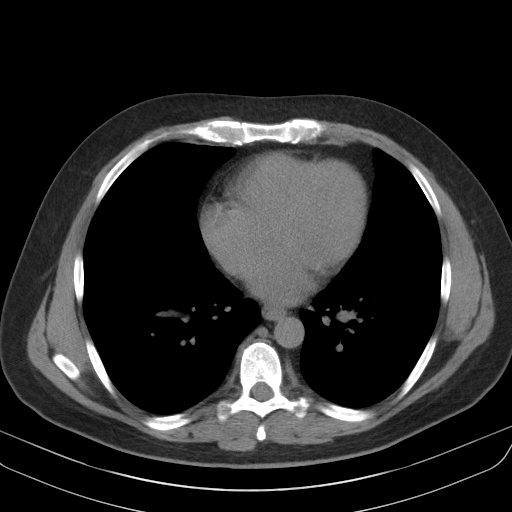
[im 96/101  lung]
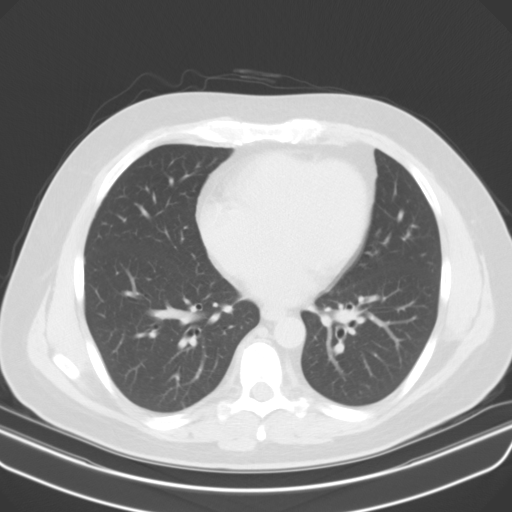

[15 of 32 positions shown; findings below may reference images not displayed]

FINDINGS: Lower chest: No acute abnormality.

Hepatobiliary: No focal liver abnormality is seen. No gallstones,
gallbladder wall thickening, or biliary dilatation.

Pancreas: Atrophic pancreas is noted.

Spleen: Normal in size without focal abnormality.

Adrenals/Urinary Tract: Adrenal glands are unremarkable. Status post
bilateral nephrectomy. Renal transplant is noted in the left lower
quadrant. No hydronephrosis is noted. Urinary bladder is
unremarkable.

Stomach/Bowel: There is noted substantial wall thickening involving
the duodenum and proximal jejunum suggesting enteritis or possibly
infiltrative disorder. No significant bowel dilatation is noted.

Vascular/Lymphatic: No significant vascular findings are present. No
enlarged abdominal or pelvic lymph nodes.

Reproductive: Stable prostatic calcifications are noted. No other
abnormality seen.

Other: No abdominal wall hernia or abnormality. No abdominopelvic
ascites.

Musculoskeletal: No acute or significant osseous findings.
IMPRESSION: Atrophic pancreas.

Status post bilateral nephrectomy.

Renal transplant seen in left lower quadrant of abdomen.

Substantial wall thickening is seen involving the duodenum and
proximal jejunum suggesting enteritis or possibly infiltrative
disorder. Further evaluation with barium upper GI examination or
endoscopy may be performed.

## 2018-07-28 ENCOUNTER — Emergency Department (HOSPITAL_COMMUNITY): Payer: BLUE CROSS/BLUE SHIELD

## 2018-07-28 ENCOUNTER — Other Ambulatory Visit: Payer: Self-pay

## 2018-07-28 ENCOUNTER — Encounter (HOSPITAL_COMMUNITY): Payer: Self-pay | Admitting: Emergency Medicine

## 2018-07-28 ENCOUNTER — Emergency Department (HOSPITAL_COMMUNITY)
Admission: EM | Admit: 2018-07-28 | Discharge: 2018-07-28 | Disposition: A | Discharge status: Home / Self Care | Payer: BLUE CROSS/BLUE SHIELD | Attending: Internal Medicine | Admitting: Internal Medicine

## 2018-07-28 DIAGNOSIS — T360X5A Adverse effect of penicillins, initial encounter: Secondary | ICD-10-CM

## 2018-07-28 DIAGNOSIS — R112 Nausea with vomiting, unspecified: Secondary | ICD-10-CM

## 2018-07-28 DIAGNOSIS — E860 Dehydration: Secondary | ICD-10-CM | POA: Insufficient documentation

## 2018-07-28 DIAGNOSIS — R0602 Shortness of breath: Secondary | ICD-10-CM

## 2018-07-28 DIAGNOSIS — E039 Hypothyroidism, unspecified: Secondary | ICD-10-CM | POA: Diagnosis not present

## 2018-07-28 DIAGNOSIS — R509 Fever, unspecified: Secondary | ICD-10-CM | POA: Diagnosis present

## 2018-07-28 DIAGNOSIS — T361X5A Adverse effect of cephalosporins and other beta-lactam antibiotics, initial encounter: Secondary | ICD-10-CM | POA: Diagnosis not present

## 2018-07-28 DIAGNOSIS — E1022 Type 1 diabetes mellitus with diabetic chronic kidney disease: Secondary | ICD-10-CM

## 2018-07-28 DIAGNOSIS — Z9641 Presence of insulin pump (external) (internal): Secondary | ICD-10-CM

## 2018-07-28 DIAGNOSIS — J1289 Other viral pneumonia: Secondary | ICD-10-CM

## 2018-07-28 DIAGNOSIS — Z940 Kidney transplant status: Secondary | ICD-10-CM

## 2018-07-28 DIAGNOSIS — Z79899 Other long term (current) drug therapy: Secondary | ICD-10-CM

## 2018-07-28 DIAGNOSIS — Z885 Allergy status to narcotic agent status: Secondary | ICD-10-CM

## 2018-07-28 DIAGNOSIS — Z79890 Hormone replacement therapy (postmenopausal): Secondary | ICD-10-CM

## 2018-07-28 DIAGNOSIS — N186 End stage renal disease: Secondary | ICD-10-CM

## 2018-07-28 DIAGNOSIS — E109 Type 1 diabetes mellitus without complications: Secondary | ICD-10-CM | POA: Insufficient documentation

## 2018-07-28 DIAGNOSIS — J1282 Pneumonia due to coronavirus disease 2019: Secondary | ICD-10-CM

## 2018-07-28 DIAGNOSIS — Z888 Allergy status to other drugs, medicaments and biological substances status: Secondary | ICD-10-CM

## 2018-07-28 DIAGNOSIS — T86891 Other transplanted tissue failure: Secondary | ICD-10-CM

## 2018-07-28 LAB — COMPREHENSIVE METABOLIC PANEL
ALT: 22 U/L (ref 0–44)
AST: 23 U/L (ref 15–41)
Albumin: 3.2 g/dL — ABNORMAL LOW (ref 3.5–5.0)
Alkaline Phosphatase: 88 U/L (ref 38–126)
Anion gap: 9 (ref 5–15)
BUN: 13 mg/dL (ref 6–20)
CO2: 21 mmol/L — ABNORMAL LOW (ref 22–32)
Calcium: 8.8 mg/dL — ABNORMAL LOW (ref 8.9–10.3)
Chloride: 105 mmol/L (ref 98–111)
Creatinine, Ser: 0.95 mg/dL (ref 0.61–1.24)
GFR calc Af Amer: >60 mL/min (ref >60)
GFR calc non Af Amer: >60 mL/min (ref >60)
Glucose, Bld: 219 mg/dL — ABNORMAL HIGH (ref 70–99)
Potassium: 4.9 mmol/L (ref 3.5–5.1)
Sodium: 135 mmol/L (ref 135–145)
Total Bilirubin: 0.6 mg/dL (ref 0.3–1.2)
Total Protein: 6.8 g/dL (ref 6.5–8.1)

## 2018-07-28 LAB — CBC WITH DIFFERENTIAL/PLATELET
Abs Immature Granulocytes: 0.1 10*3/uL — ABNORMAL HIGH (ref 0.00–0.07)
Band Neutrophils: 1 %
Basophils Absolute: 0 10*3/uL (ref 0.0–0.1)
Basophils Relative: 0 %
Eosinophils Absolute: 0 10*3/uL (ref 0.0–0.5)
Eosinophils Relative: 0 %
HCT: 46.7 % (ref 39.0–52.0)
Hemoglobin: 15.3 g/dL (ref 13.0–17.0)
Lymphocytes Relative: 16 %
Lymphs Abs: 0.8 10*3/uL (ref 0.7–4.0)
MCH: 30.3 pg (ref 26.0–34.0)
MCHC: 32.8 g/dL (ref 30.0–36.0)
MCV: 92.5 fL (ref 80.0–100.0)
Metamyelocytes Relative: 1 %
Monocytes Absolute: 0.3 10*3/uL (ref 0.1–1.0)
Monocytes Relative: 5 %
Neutro Abs: 4.1 10*3/uL (ref 1.7–7.7)
Neutrophils Relative %: 77 %
Platelets: 272 10*3/uL (ref 150–400)
RBC: 5.05 MIL/uL (ref 4.22–5.81)
RDW: 12.5 % (ref 11.5–15.5)
WBC: 5.3 10*3/uL (ref 4.0–10.5)
nRBC: 0 % (ref 0.0–0.2)

## 2018-07-28 LAB — POCT I-STAT EG7
Acid-base deficit: 1 mmol/L (ref 0.0–2.0)
Bicarbonate: 24.2 mmol/L (ref 20.0–28.0)
Calcium, Ion: 1.21 mmol/L (ref 1.15–1.40)
HCT: 27 % — ABNORMAL LOW (ref 39.0–52.0)
Hemoglobin: 9.2 g/dL — ABNORMAL LOW (ref 13.0–17.0)
O2 Saturation: 61 %
Potassium: 4.4 mmol/L (ref 3.5–5.1)
Sodium: 140 mmol/L (ref 135–145)
TCO2: 25 mmol/L (ref 22–32)
pCO2, Ven: 43.6 mmHg — ABNORMAL LOW (ref 44.0–60.0)
pH, Ven: 7.351 (ref 7.250–7.430)
pO2, Ven: 33 mmHg (ref 32.0–45.0)

## 2018-07-28 LAB — LACTIC ACID, PLASMA: Lactic Acid, Venous: 1.1 mmol/L (ref 0.5–1.9)

## 2018-07-28 LAB — LIPASE, BLOOD: Lipase: 18 U/L (ref 11–51)

## 2018-07-28 LAB — TROPONIN I: Troponin I: <0.03 ng/mL (ref <0.03)

## 2018-07-28 MED ORDER — ONDANSETRON HCL 4 MG PO TABS: 4.0000 mg | ORAL_TABLET | Freq: Three times a day (TID) | ORAL | 0 refills | Status: AC | PRN

## 2018-07-28 MED ORDER — SODIUM CHLORIDE 0.9 % IV BOLUS
500.0000 mL | Freq: Once | INTRAVENOUS | Status: AC
  Administered 2018-07-28: 500 mL via INTRAVENOUS

## 2018-07-28 MED ORDER — ONDANSETRON HCL 4 MG PO TABS: 4.0000 mg | ORAL_TABLET | Freq: Three times a day (TID) | ORAL | 0 refills | Status: DC | PRN

## 2018-07-28 MED ORDER — ACETAMINOPHEN 500 MG PO TABS
1000.0000 mg | ORAL_TABLET | Freq: Once | ORAL | Status: AC
  Administered 2018-07-28: 1000 mg via ORAL
  Filled 2018-07-28: qty 2

## 2018-07-28 NOTE — H&P (Signed)
Date: 07/28/2018               Patient Name:  Daniel Hill MRN: [id number]  DOB: 1975-10-27 Age / Sex: 43 y.o., male   PCP: Raina Mina., MD         Medical Service: Internal Medicine Teaching Service         Attending Physician: Dr. Sid Falcon, MD    First Contact: Dr. Trilby Drummer Pager: [phone]            After Hours (After 5p/  First Contact Pager: [phone]  weekends / holidays): Second Contact Pager: [phone]   Chief Complaint:Fevers, Nausea, Vomiting, DOE, Known COVID-19  History of Present Illness: Mr Friedmann is a 43 yo M with Hx of ESRD (s/p renal transplant), DM1 (Dx age 60, s/p failed pancreas transplant, on insulin pump), and Hypthroidism who presents with worsening nausea and DOE in the setting of known COVID-19 infection. Patient was seen by PCP on 3/25 for URI symptoms and was tested for COVID at that time. Positive result same back on 3/27. Since that time he has had ongoing fevers and mild cough. No shortness of breath at rest, but a subjective feeling of tiredness with exertion (that he states seems to improve with deep breaths). He has noted nausea, which started after he began to take Augmentin called in by his nephrologist's office for a possible sinus infection after a telephone visit.             He was advised to come to the ED this morning due to decreased oral intake related to his nausea and vomiting. He states he has already gotten rid of the Augmentin as he suspected it may have been the source of his symptoms. He was feeling better when seen in the ED after receiving a dose of Zofran. He states he would be comfortable going home if he is able to again tolerate PO intake.             In the ED, VSS saturating in mid 90s on room air. CBC, Lactic Acid, Troponin, and Lipase WNL. CMP showed glucose in 200s, Ca 8.8, Alb 3.2 and CO2 21. CXR showed multifocal right sided opacities consistent with pneumonia. IMTS consulted for evaluation for observation.  Meds:  Current Meds  Medication Sig  . escitalopram (LEXAPRO) 10 MG tablet Take 10 mg by mouth 2 (two) times daily.  . insulin lispro (HUMALOG) 100 UNIT/ML injection Use up to 100 units per day in insulin pump (Patient taking differently: Inject 5 Units into the skin continuous. Use up to 100 units per day in insulin pump)  . levothyroxine (SYNTHROID, LEVOTHROID) 75 MCG tablet Take 1 tablet (75 mcg total) by mouth daily.  . Multiple Vitamins-Minerals (MULTIVITAMINS THER. W/MINERALS) TABS Take 1 tablet by mouth daily.  . tacrolimus (PROGRAF) 1 MG capsule Take 3 mg by mouth 2 (two) times daily.  . traMADol (ULTRAM) 50 MG tablet Take 50-100 mg by mouth every 6 (six) hours as needed for pain.  . TRESIBA FLEXTOUCH 100 UNIT/ML SOPN FlexTouch Pen Inject 10 Units into the skin daily.   . Vitamin D, Ergocalciferol, (DRISDOL) 1.25 MG (50000 UT) CAPS capsule Take 50,000 Units by mouth every Saturday.    Allergies: Allergies as of 07/28/2018 - Review Complete 07/28/2018  Allergen Reaction Noted  . Codeine Nausea And Vomiting 05/18/2011  . Metoclopramide hcl Nausea And Vomiting 05/18/2011   Past Medical History:  Diagnosis Date  . Anxiety   .  Depression   . Diabetes mellitus   . GERD (gastroesophageal reflux disease)   . Hypothyroidism   . Kidney replaced by transplant   . Pancreas transplanted ([place])   . Thyroid disease     Family History:  Family History  Problem Relation Age of Onset  . Stroke Mother   . Lymphoma Father   . Diabetes type II Father   Reviewed  Social History:  Social History   Tobacco Use  . Smoking status: Never Smoker  . Smokeless tobacco: Never Used  Substance Use Topics  . Alcohol use: No  . Drug use: No  Reviewed  Review of Systems: A complete ROS was negative except as per HPI.  Physical Exam: Blood pressure 130/90, pulse 88, temperature 99 F (37.2 C), temperature source Oral, resp. rate 12, SpO2 95 %. Physical Exam Constitutional:      General: He is not  in acute distress. No diaphoresis. Cardiovascular:     Rate and Rhythm: Normal rate and regular rhythm.     Pulses: Normal pulses.     Heart sounds: Normal heart sounds.  Pulmonary:     Effort: No respiratory distress.     Comments: Decreased air movement on R Abdominal:     General: Abdomen is flat. Bowel sounds are normal.     Palpations: Abdomen is soft.  Musculoskeletal:        General: No swelling or tenderness.  Skin:    General: Skin is warm and dry.  Neurological:     General: No focal deficit present.     Mental Status: He is alert. Mental status is at baseline.   EKG: personally reviewed and I agree with normal sinus rhythm  CXR: personally reviewed and I agree with: Areas of mild interstitial and hazy airspace opacity in the right lung suggest multifocal pneumonia. No evidence of pulmonary edema.  Assessment & Plan by Problem: Active Problems:   Pneumonia due to COVID-19 virus   Nausea and vomiting  Nausea and vomiting: Likely medication induced as timing coincides with initiation of Augmenting for possible sinus infection. Symptoms relieved by Zofran in the ED and patient is now tolerating PO intake. Patient comfortable returning home and has already stopped taking the antibiotic. He will be provided with a prescription of Zofran to be taken at home as needed. - Zofran 4mg  PO, q8h, PRN  Pneumonia due to COVID-19 Virus: Multifocal pneumonia noted on CXR in this patient with know Covid-19 infection and no leukocytosis is consistent with Viral Pneumonia 2/2 Covid-19. No new respiratory symptoms at this time. Saturating well on RA. He has been able to isolate appropriately at home for the past 11-12 days. - Continue isolating at home until symptom free for 72+ hrs - Return if worsening shortness of breath or other concerning symptoms develop  Type 1 Diabetes: Diagnosed at 43 yo. Stable on home Insulin and Insulin pump, no significant elevation during the course of his  infection. - Continue home regimen  Renal transplant: History of T1D as above. Previously poorly controlled leading to ESRD and subsequent Dialysis followed by Renal transplant in 2003. Renal function is normal at presentation. - Continue prograf  Hypothyroidism: Continue home Synthroid  Dispo: Discharge from the ED, after reevaluation as patient is now tolerating PO and other symptoms are stable.  Signed: Neva Seat, MD 07/28/2018, 2:19 PM  Pager: [phone]

## 2018-07-28 NOTE — Discharge Summary (Signed)
Name: Daniel Hill MRN: [id number] DOB: [birth year] 43 y.o. PCP: Raina Mina., MD  Date of Admission: 07/28/2018  8:33 AM Date of Discharge: 07/28/2018  Attending Physician: Sid Falcon, MD  Discharge Diagnosis: 1. Nausea and Vomiting 2. Pneumonia due to COVID-19 virus  Discharge Medications: Allergies as of 07/28/2018      Reactions   Codeine Nausea And Vomiting   Metoclopramide Hcl Nausea And Vomiting      Medication List    TAKE these medications   azithromycin 250 MG tablet Commonly known as:  ZITHROMAX Take 250 mg by mouth See admin instructions. Take 2 tablets by mouth on day 1, then take 1 tablet daily on days 2-5.   escitalopram 10 MG tablet Commonly known as:  LEXAPRO Take 10 mg by mouth 2 (two) times daily.   Glucagon 3 MG/DOSE Powd Commonly known as:  [place] Two Pack Place 3 mg into the nose once as needed for up to 1 dose.   insulin lispro 100 UNIT/ML injection Commonly known as:  HumaLOG Use up to 100 units per day in insulin pump What changed:    how much to take  how to take this  when to take this   levothyroxine 75 MCG tablet Commonly known as:  SYNTHROID, LEVOTHROID Take 1 tablet (75 mcg total) by mouth daily.   multivitamins ther. w/minerals Tabs tablet Take 1 tablet by mouth daily.   OmniPod Dash 5 Pack Misc 6 Packages by Does not apply route every 3 (three) days.   ondansetron 4 MG tablet Commonly known as:  Zofran Take 1 tablet (4 mg total) by mouth every 8 (eight) hours as needed for nausea or vomiting.   tacrolimus 1 MG capsule Commonly known as:  PROGRAF Take 3 mg by mouth 2 (two) times daily.   traMADol 50 MG tablet Commonly known as:  ULTRAM Take 50-100 mg by mouth every 6 (six) hours as needed for pain.   Tyler Aas FlexTouch 100 UNIT/ML Sopn FlexTouch Pen Generic drug:  insulin degludec Inject 10 Units into the skin daily.   Vitamin D (Ergocalciferol) 1.25 MG (50000 UT) Caps capsule Commonly known as:  DRISDOL  Take 50,000 Units by mouth every Saturday.       Disposition and follow-up:   Mr.Daniel Hill was discharged from [facility] in Stable condition.  At the hospital follow up visit please address:  1.  Nausea and Vomiting - Ensure resolution of Nausea and Vomiting and continued tolerance of PO intake  2. COVID-19 Pneumonia - Monitor for resolution of symptoms and completion of quarintine  2.  Labs / imaging needed at time of follow-up: None  3.  Pending labs/ test needing follow-up: None  Follow-up Appointments:  Hospital Course by problem list: Nausea and vomiting: Patient present with several days of nausea and vomiting. He had contacted his PCP and was advised to come to the ED this morning due to decreased PO intake. Suspect medication induced as timing coincides with initiation of Augmenting for possible sinus infection. Symptoms relieved by Zofran in the ED and patient tolerated PO intake well. Patient was comfortable returning home and has already stopped taking the antibiotic. He was provided a prescription of Zofran to be taken at home as needed.  Pneumonia due to COVID-19 Virus:Multifocal pneumonia noted on CXR in this patient with known Covid-19 infection and no leukocytosis is consistent with Viral Pneumonia 2/2 Covid-19. No new respiratory when seen. Reports persistent fevers and mild intermittent cough.  Saturating well on room air when seen. He had been able to isolate appropriately at home for the past 11-12 days and was advised to continue to do so until fever free for 72+ hrs. He is to contact his PCP or return to ED if worsening shortness of breath or other concerning symptoms develop.  Discharge Vitals:   BP 130/90   Pulse 88   Temp 99 F (37.2 C) (Oral)   Resp 12   SpO2 95%   Pertinent Labs, Studies, and Procedures:   CBC CBC Latest Ref Rng & Units 07/28/2018 05/19/2011  WBC 4.0 - 10.5 K/uL 5.3 14.4(H)  Hemoglobin 13.0 - 17.0 g/dL 15.3 13.1   Hematocrit 39.0 - 52.0 % 46.7 38.2(L)  Platelets 150 - 400 K/uL 272 403(H)  CBC value on I-Stat device showed 9.2, suspect erroneous value given Hgb noted on formal CBC.  CMP     Component Value Date/Time   NA 140 07/28/2018 1226   K 4.4 07/28/2018 1226   CL 105 07/28/2018 0915   CO2 21 (L) 07/28/2018 0915   GLUCOSE 219 (H) 07/28/2018 0915   BUN 13 07/28/2018 0915   CREATININE 0.95 07/28/2018 0915   CREATININE 0.97 05/02/2018 0921   CALCIUM 8.8 (L) 07/28/2018 0915   PROT 6.8 07/28/2018 0915   ALBUMIN 3.2 (L) 07/28/2018 0915   AST 23 07/28/2018 0915   ALT 22 07/28/2018 0915   ALKPHOS 88 07/28/2018 0915   BILITOT 0.6 07/28/2018 0915   GFRNONAA >60 07/28/2018 0915   GFRNONAA 95 05/02/2018 0921   GFRAA >60 07/28/2018 0915   GFRAA 110 05/02/2018 0921    EKG Interpretation  Date/Time:  Saturday July 28 2018 08:38:17 EDT Ventricular Rate:  88 PR Interval:    QRS Duration: 94 QT Interval:  364 QTC Calculation: 441 R Axis:   57 Text Interpretation:  Sinus rhythm No STEMI.  Confirmed by Nanda Quinton [phone]) on 07/28/2018 8:45:32 AM      CXR (port, 1 view): IMPRESSION: 1. Areas of mild interstitial and hazy airspace opacity in the right lung suggest multifocal pneumonia. No evidence of pulmonary edema.  Discharge Instructions: Discharge Instructions    Call MD for:  difficulty breathing, headache or visual disturbances   Complete by:  As directed    Discharge instructions   Complete by:  As directed    Thank you for allowing [place] to care for you.  Your nausea and vomiting improved with medication. We will proved a prescription to have at home. The timing of your symptoms is consistent with medication induced nausea.   Please continue to quarantine at home until fever free without tylenol for at least 72 hours.  If you have significant shortness of breath or other severe symptoms, do not hesitate to return to the ED.     Signed: Neva Seat, MD 07/28/2018, 1:13 PM    Pager: [phone]

## 2018-07-28 NOTE — ED Provider Notes (Signed)
Emergency Department Provider Note   I have reviewed the triage vital signs and the nursing notes.   HISTORY  Chief Complaint Fever (covid pos) and Nausea   HPI Daniel Hill is a 43 y.o. male with PMH of DM, GERD, Anxiety, and Kidney/Pancreas transplant with confirmed COVID-19 presents to the emergency department by EMS with decreased oral intake, nausea/vomiting, fever, cough, worsening shortness of breath.  Patient states that shortness of breath is specifically worse with exertion.  He reports multiple episodes of vomiting per day and feels very thirsty.  He denies diarrhea.  He continues to urinate.  He denies any abdominal pain.  He does report some lower back discomfort but associates this with laying in the bed.  He has been using his insulin pump and states that his pump sites have been normal.  He has not had any known pump complications.  He reports that his blood sugars have been only mildly elevated.  He denies any chest pain. EMS reported mild hypoxemia on scene of 92% and started 2L [place] en route.    Past Medical History:  Diagnosis Date  . Anxiety   . Depression   . Diabetes mellitus   . GERD (gastroesophageal reflux disease)   . Hypothyroidism   . Kidney replaced by transplant   . Pancreas transplanted ([place])   . Thyroid disease     Patient Active Problem List   Diagnosis Date Noted  . Pneumonia due to COVID-19 virus 07/28/2018  . Type 1 diabetes, uncontrolled, with retinopathy ([place]) 03/21/2018  . Hypothyroidism due to Hashimoto's thyroiditis 03/21/2018  . 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  . History of renal transplant 05/20/2011  . Obesity 05/20/2011    Past Surgical History:  Procedure Laterality Date  . EYE SURGERY      Allergies Codeine and Metoclopramide hcl  Family History  Problem Relation Age of Onset  . Stroke Mother   . Lymphoma Father   . Diabetes type II Father     Social History Social History   Tobacco  Use  . Smoking status: Never Smoker  . Smokeless tobacco: Never Used  Substance Use Topics  . Alcohol use: No  . Drug use: No    Review of Systems  Constitutional: Positive fever and fatigue.  Eyes: No visual changes. ENT: No sore throat. Cardiovascular: Denies chest pain. Respiratory: Positive shortness of breath. Gastrointestinal: No abdominal pain. Positive nausea, vomiting.  No diarrhea.  No constipation. Genitourinary: Negative for dysuria. Musculoskeletal: Positive for back pain. Skin: Negative for rash. Neurological: Negative for headaches, focal weakness or numbness.  10-point ROS otherwise negative.  ____________________________________________   PHYSICAL EXAM:  VITAL SIGNS: Vitals:   07/28/18 0839  BP: 130/90  Pulse: 88  Resp: 12  Temp: 99 F (37.2 C)  SpO2: 95%    Constitutional: Alert and oriented. Well appearing and in no acute distress. Eyes: Conjunctivae are normal.  Head: Atraumatic. Nose: No congestion/rhinnorhea. Mouth/Throat: Mucous membranes are moist. Neck: No stridor.   Cardiovascular: Normal rate, regular rhythm. Good peripheral circulation. Grossly normal heart sounds.   Respiratory: Slight increased respiratory effort.  No retractions. Lungs CTAB. Gastrointestinal: Soft and nontender. No distention.  Musculoskeletal: No lower extremity tenderness nor edema. No gross deformities of extremities. Neurologic:  Normal speech and language. No gross focal neurologic deficits are appreciated.  Skin:  Skin is warm, dry and intact. No rash noted. Insulin pump in the LLQ with no surrounding cellulitis.    ____________________________________________   LABS (  all labs ordered are listed, but only abnormal results are displayed)  Labs Reviewed  COMPREHENSIVE METABOLIC PANEL - Abnormal; Notable for the following components:      Result Value   CO2 21 (*)    Glucose, Bld 219 (*)    Calcium 8.8 (*)    Albumin 3.2 (*)    All other components  within normal limits  CBC WITH DIFFERENTIAL/PLATELET - Abnormal; Notable for the following components:   Abs Immature Granulocytes 0.10 (*)    All other components within normal limits  POCT I-STAT EG7 - Abnormal; Notable for the following components:   pCO2, Ven 43.6 (*)    HCT 27.0 (*)    Hemoglobin 9.2 (*)    All other components within normal limits  LIPASE, BLOOD  TROPONIN I  LACTIC ACID, PLASMA  URINALYSIS, ROUTINE W REFLEX MICROSCOPIC  I-STAT VENOUS BLOOD GAS, ED   ____________________________________________  EKG   EKG Interpretation  Date/Time:  Saturday July 28 2018 08:38:17 EDT Ventricular Rate:  88 PR Interval:    QRS Duration: 94 QT Interval:  364 QTC Calculation: 441 R Axis:   57 Text Interpretation:  Sinus rhythm No STEMI.  Confirmed by Nanda Quinton [phone]) on 07/28/2018 8:45:32 AM       ____________________________________________  RADIOLOGY  Dg Chest Portable 1 View  Result Date: 07/28/2018 CLINICAL DATA:  Respiratory distress.  Immunocompromised patient. EXAM: PORTABLE CHEST 1 VIEW COMPARISON:  05/20/2015 FINDINGS: Mild interstitial and hazy intervening airspace type opacities are noted in a patchy distribution in the right lung. There are also mildly prominent bronchovascular markings, most evident in the lower lungs, stable from the prior study. Left lung otherwise clear. No pleural effusion or pneumothorax. Cardiac silhouette is normal in size. No mediastinal or hilar masses. Skeletal structures are grossly intact. IMPRESSION: 1. Areas of mild interstitial and hazy airspace opacity in the right lung suggest multifocal pneumonia. No evidence of pulmonary edema. Electronically Signed   By: Lajean Manes M.D.   On: 07/28/2018 09:46    ____________________________________________   PROCEDURES  Procedure(s) performed:   Procedures  None  ____________________________________________   INITIAL IMPRESSION / ASSESSMENT AND PLAN / ED COURSE  Pertinent  labs & imaging results that were available during my care of the patient were reviewed by me and considered in my medical decision making (see chart for details).   Patient presents to the emergency department as a confirmed [place] patient with history of immune suppression status post pancreas and kidney transplant.  He is speaking in clear sentences and had mild hypoxemia with EMS requiring 2 L nasal cannula.  He reports increased shortness of breath with exertion only.  Nausea, vomiting, fevers continue.  Patient has poor p.o. intake at home.  No abdominal tenderness on exam.  Plan for plain film of the chest along with screening labs.  He received 8 mg of IV Zofran in route along with IV fluids.  Will screen for DKA but lower suspicion for this.   10:40 PM  Updated patient by phone regarding his results.  Chest x-ray showing likely multifocal pneumonia consistent with his COVID Dx. Lab work is largely unremarkable.  No DKA.  Given his creased work of breathing and mild oxygen requirement I do plan for admission for monitoring.  Patient comfortable with plan.   Discussed patient's case with Internal Medicine to request admission. Patient and family (if present) updated with plan. Care transferred to Medicine service.  I reviewed all nursing notes, vitals, pertinent old records,  EKGs, labs, imaging (as available).   Daniel Hill was evaluated in Emergency Department on 07/28/2018 for the symptoms described in the history of present illness. He was evaluated in the context of the global COVID-19 pandemic, which necessitated consideration that the patient might be at risk for infection with the SARS-CoV-2 virus that causes COVID-19. Institutional protocols and algorithms that pertain to the evaluation of patients at risk for COVID-19 are in a state of rapid change based on information released by regulatory bodies including the CDC and federal and state organizations. These policies and algorithms were  followed during the patient's care in the ED.  ____________________________________________  FINAL CLINICAL IMPRESSION(S) / ED DIAGNOSES  Final diagnoses:  SOB (shortness of breath)  Non-intractable vomiting with nausea, unspecified vomiting type  Dehydration    MEDICATIONS GIVEN DURING THIS VISIT:  Medications  sodium chloride 0.9 % bolus 500 mL (500 mLs Intravenous New Bag/Given 07/28/18 0857)  acetaminophen (TYLENOL) tablet 1,000 mg (1,000 mg Oral Given 07/28/18 0916)    Note:  This document was prepared using Dragon voice recognition software and may include unintentional dictation errors.  Nanda Quinton, MD Emergency Medicine    , [facility], MD 07/28/18 [phone]

## 2018-07-28 NOTE — ED Triage Notes (Signed)
Patient arrives via [facility]. Pt had positive covid 19 test 12 days ago. Has had ongoing fever since testing. Reports ongoing nausea. Patient has history of multiple organ transplant (Pancreas and kidney) and was advised to come in by transplant MD r/t decreased oral intake. Patient received 800cc NS and 8mg  zofran pta. Pt reports fever as high as 100.9 at home, last tylenol dose was last night. Pt denies sob at rest and reports mild productive cough at times. A/ox4, resp e/u, nad. vss.

## 2018-09-03 ENCOUNTER — Telehealth: Payer: Self-pay

## 2018-09-03 ENCOUNTER — Other Ambulatory Visit: Payer: Self-pay

## 2018-09-03 NOTE — Telephone Encounter (Signed)
OK 

## 2018-09-03 NOTE — Telephone Encounter (Signed)
Pharmacy states Humalog is not covered and would like to change to Novolog which is covered.  Please advise.

## 2018-09-04 ENCOUNTER — Other Ambulatory Visit: Payer: Self-pay | Admitting: Internal Medicine

## 2018-09-04 ENCOUNTER — Other Ambulatory Visit: Payer: Self-pay

## 2018-09-04 ENCOUNTER — Telehealth: Payer: Self-pay | Admitting: Internal Medicine

## 2018-09-04 MED ORDER — DEXCOM G6 SENSOR MISC: 1.0000 | 3 refills | Status: DC

## 2018-09-04 MED ORDER — DEXCOM G6 RECEIVER DEVI: 1.0000 | Freq: Once | 0 refills | Status: DC

## 2018-09-04 MED ORDER — DEXCOM G6 TRANSMITTER MISC: 1.0000 | 3 refills | Status: DC

## 2018-09-04 MED ORDER — INSULIN ASPART 100 UNIT/ML ~~LOC~~ SOLN: SUBCUTANEOUS | 11 refills | Status: DC

## 2018-09-04 NOTE — Telephone Encounter (Signed)
Patient called back also stating they are wanting him to switch from [place] to [facility] also. Needs reader and sensor

## 2018-09-04 NOTE — Telephone Encounter (Signed)
Dr. Cruzita Lederer please advise.

## 2018-09-04 NOTE — Telephone Encounter (Signed)
Sent Novolog to pharmacy as requested. Humalog is not covered by pt insurance.

## 2018-09-04 NOTE — Telephone Encounter (Signed)
OK.  I sent the prescriptions for the CGM, transmitter, and receiver to [facility].

## 2018-09-04 NOTE — Telephone Encounter (Signed)
Patient states that Novolog is covered with insurance but Humalog is no longer covered.  Please Advise, Thanks

## 2018-09-05 NOTE — Telephone Encounter (Signed)
Per [facility] Dexcom needs a PA.  I believe this must go through a supplier and not pharmacy.  I will ask for Linda's assistance.

## 2018-09-26 ENCOUNTER — Other Ambulatory Visit: Payer: Self-pay

## 2018-09-26 MED ORDER — OMNIPOD DASH PODS (GEN 4) MISC: 6.0000 | 3 refills | Status: DC

## 2018-09-27 ENCOUNTER — Telehealth: Payer: Self-pay

## 2018-09-27 NOTE — Telephone Encounter (Signed)
PA for Omni-pod faxed.

## 2018-10-02 ENCOUNTER — Telehealth: Payer: Self-pay

## 2018-10-02 ENCOUNTER — Ambulatory Visit (INDEPENDENT_AMBULATORY_CARE_PROVIDER_SITE_OTHER): Payer: BLUE CROSS/BLUE SHIELD | Admitting: Internal Medicine

## 2018-10-02 ENCOUNTER — Encounter: Payer: Self-pay | Admitting: Internal Medicine

## 2018-10-02 DIAGNOSIS — E063 Autoimmune thyroiditis: Secondary | ICD-10-CM | POA: Diagnosis not present

## 2018-10-02 DIAGNOSIS — E10319 Type 1 diabetes mellitus with unspecified diabetic retinopathy without macular edema: Secondary | ICD-10-CM | POA: Diagnosis not present

## 2018-10-02 DIAGNOSIS — IMO0002 Reserved for concepts with insufficient information to code with codable children: Secondary | ICD-10-CM

## 2018-10-02 DIAGNOSIS — E038 Other specified hypothyroidism: Secondary | ICD-10-CM

## 2018-10-02 DIAGNOSIS — E1065 Type 1 diabetes mellitus with hyperglycemia: Secondary | ICD-10-CM | POA: Diagnosis not present

## 2018-10-02 MED ORDER — OMNIPOD DASH PODS (GEN 4) MISC: 6.0000 | 3 refills | Status: DC

## 2018-10-02 NOTE — Patient Instructions (Addendum)
Please continue: - Tresiba 11-12 units daily - Humalog   ICR 1:12  ISF 30  Target CBG: 150  Please start the boluses 10-15 min before meals.  After you start on the pump: - basal rates: 12 am: 0.4 5 am: 1.0 - ICR: 1:12 (may need to decrease to 11 if sugars are still high after meals after moving the boluses 15 min before meals) - target: 110-120 - ISF: 30 - Insulin on Board: 4h  Please return in 3 months.

## 2018-10-02 NOTE — Telephone Encounter (Signed)
Prior authorization for Omnipod dash 5 pack has been approved by patient's insurance.  Coverage is effective 09/27/2018 to 09/26/2019  Approval letter has been sent to scanning.

## 2018-10-02 NOTE — Progress Notes (Signed)
Patient ID: Daniel Hill, male   DOB: [birth year], 43 y.o.   MRN: [id number]   [name]  I connected with the patient on 10/02/18 at  8:58 AM EDT by a video enabled telemedicine application and verified that I am speaking with the correct person.   I discussed the limitations of evaluation and management by telemedicine and the availability of in person appointments. The patient expressed understanding and agreed to proceed.   Details of the encounter are shown below.  HPI: Daniel Hill is a 43 y.o.-year-old male, initially referred by his nephrologist, Dr. Jimmy Hill, presenting for follow-up for DM1, uncontrolled, with complications (DKA, mild gastroparesis, proliferative retinopathy, ESRD -history of being on HD and peritoneal dialysis for 5 years, PN). Last visit 5 months ago.  He was diagnosed with COVID-19 in 06/2018.  He recovered well, but still has increased fatigue with exertion.  He is off the insulin pump >> no pods for 2 weeks.  He is now back on his basal-bolus insulin regimen.  DM1: Patient has been diagnosed with diabetes at 43 y/o. She was prev. Seen by [facility] endocrinology.  She is status post kidney-pancreatic transplant in 2003, but he is pancreas transplant failed in 2013.  He is considered too high risk for a new pancreatic transplant.  His retinopathy and gastroparesis improved after transplant.  He had several eye surgeries (vitrectomy and scleral buckle procedures).  He has chronic blindness in his left eye.  Last hemoglobin A1c was: Lab Results  Component Value Date   HGBA1C 8.9 (A) 03/21/2018   HGBA1C 9.9 (H) 05/19/2011  07/15/2016: HbA1c 7.8%  He was on the following regimen: - Tresiba 10 units at bedtime - Humalog - ICR 1:15, target 120, ISF 40 - injects after meals (ends up with 2-6 units per meal)  He was on: Omni pod with freestyle libre CGM >> at last visit, we discussed about switching to a T slim, which he wanted to  do, however, this was not covered by his insurance.  We also had to submit preauthorization for Omnipod.  This was approved and he is waiting for new parts now. He has NovoLog in the pump.  Prev. Pump settings: - basal rates: 12 am: 0.5 units/h >> 0.8 (please increase further to 1.0 units/h if still needed after 3 to 4 days) 5 am: 0.7 >> 1.0 (please increase further to 1.2 units/h if still needed after 3 to 4 days) - ICR: 1:15 >> 1:12 (please increase further to 1:10 if still needed after 3 to 4 days) - target: 110-120 - ISF: 40 >> 30 - Insulin on Board: 4h TDD from basal insulin: ~11 units TDD from bolus insulin: ~21 units  He is now on - for the last 2 weeks: - Tresiba 11-12 units daily - Humalog pens:  ICR 1:12  ISF 30  Target CBG: 150  On Dexcom 6 CGM- checks >4x a day: - am: 106-145, 180 - 2h after b'fast: 220 - lunch: 120 - 2h after lunch: n/c - dinner: 120 - 2h after dinner: 200s - bedtime: n/c  Prev. Lows at 2 am - when on the pump.  Lowest sugar was 40-50s >> 96 >> 60 now; he has hypoglycemia awareness at 60-70.  No previous hypoglycemia admission. I sent a prescription for intranasal glucagon to his pharmacy.   Highest sugar was HI >> 400s >> 400 x5 since last OV.  He had DKA admissions in the past but latest before his  transplant.  Pt's meals are: - Breakfast: eggs or oatmeal - Lunch: meat and veggies, sandwich, soup - Dinner: mostly meat and veggies, sometimes pasta and bread - Snacks: 2  - usually apples or beef jerky. Now lean proteins.   -+ h/o ESRD - Dr. Jimmy Hill, last BUN/creatinine:  Lab Results  Component Value Date   BUN 13 07/28/2018   BUN 16 05/02/2018   CREATININE 0.95 07/28/2018   CREATININE 0.97 05/02/2018  02/2018: Cr 0.8  - + HL; last set of lipids: Lab Results  Component Value Date   CHOL 150 05/02/2018   HDL 37.40 (L) 05/02/2018   LDLCALC 96 05/02/2018   TRIG 83.0 05/02/2018   CHOLHDL 4 05/02/2018  He will add Fish oil.  -  last eye exam was in 2019: + DR  - he has Numbness and tingling in his feet.  He was previously on Reglan and he has intolerance to this.  Pt has FH of DM in father.  Hashimoto's thyroiditis:  Pt is on levothyroxine 75 mcg daily, taken: - in am - fasting - at least 30 min from b'fast - no Ca, Fe, MVI, PPIs - not on Biotin  Latest TSH was reviewed and this was normal: Lab Results  Component Value Date   TSH 1.83 05/02/2018   Pt denies: - feeling nodules in neck - hoarseness - dysphagia - choking - SOB with lying down  He has a family history of thyroid cancer.  He takes a vitamin D supplement.    ROS: Constitutional: no weight gain/no weight loss, + fatigue, no subjective hyperthermia, no subjective hypothermia Eyes: no blurry vision, no xerophthalmia ENT: no sore throat, + see HPI Cardiovascular: no CP/no SOB/no palpitations/no leg swelling Respiratory: no cough/no SOB/no wheezing Gastrointestinal: no N/no V/no D/no C/no acid reflux Musculoskeletal: no muscle aches/no joint aches Skin: no rashes, no hair loss Neurological: no tremors/no numbness/no tingling/no dizziness  I reviewed pt's medications, allergies, PMH, social hx, family hx, and changes were documented in the history of present illness. Otherwise, unchanged from my initial visit note.  Past Medical History:  Diagnosis Date  . Anxiety   . Depression   . Diabetes mellitus   . GERD (gastroesophageal reflux disease)   . Hypothyroidism   . Kidney replaced by transplant   . Pancreas transplanted ([place])   . Thyroid disease    Past Surgical History:  Procedure Laterality Date  . EYE SURGERY     Social History   Socioeconomic History  . Marital status: Married    Spouse name: Not on file  . children: yes  . Years of education: Not on file  . 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 Smoking status: Never Smoker  . Smokeless  tobacco: Never Used  Substance and Sexual Activity  . Alcohol use:  Beer very seldom  . Drug use: No   Current Outpatient Medications on File Prior to Visit  Medication Sig Dispense Refill  . Continuous Blood Gluc Transmit (DEXCOM G6 TRANSMITTER) MISC 1 Device by Does not apply route every 3 (three) months. 1 each 3  . escitalopram (LEXAPRO) 10 MG tablet Take 10 mg by mouth 2 (two) times daily.    . Glucagon (BAQSIMI TWO PACK) 3 MG/DOSE POWD Place 3 mg into the nose once as needed for up to 1 dose. (Patient not taking: Reported on 07/28/2018) 1 each 11  . insulin aspart (NOVOLOG) 100 UNIT/ML  injection Use up to 100 units per day in insulin pump 3 vial 11  . Insulin Disposable Pump (OMNIPOD DASH 5 PACK PODS) MISC 6 Packages by Does not apply route every 3 (three) days. 30 each 3  . levothyroxine (SYNTHROID, LEVOTHROID) 75 MCG tablet Take 1 tablet (75 mcg total) by mouth daily. 90 tablet 3  . Multiple Vitamins-Minerals (MULTIVITAMINS THER. W/MINERALS) TABS Take 1 tablet by mouth daily.    . ondansetron (ZOFRAN) 4 MG tablet Take 1 tablet (4 mg total) by mouth every 8 (eight) hours as needed for nausea or vomiting. 15 tablet 0  . ondansetron (ZOFRAN) 4 MG tablet Take 1 tablet (4 mg total) by mouth every 8 (eight) hours as needed for nausea or vomiting. 20 tablet 0  . tacrolimus (PROGRAF) 1 MG capsule Take 3 mg by mouth 2 (two) times daily.    . traMADol (ULTRAM) 50 MG tablet Take 50-100 mg by mouth every 6 (six) hours as needed for pain.    . TRESIBA FLEXTOUCH 100 UNIT/ML SOPN FlexTouch Pen Inject 10 Units into the skin daily.   6  . Vitamin D, Ergocalciferol, (DRISDOL) 1.25 MG (50000 UT) CAPS capsule Take 50,000 Units by mouth every Saturday.      No current facility-administered medications on file prior to visit.     Allergies  Allergen Reactions  . Codeine Nausea And Vomiting  . Metoclopramide Hcl Nausea And Vomiting   Family History  Problem Relation Age of Onset  . Stroke Mother   .  Lymphoma Father   . Diabetes type II Father     PE: There were no vitals taken for this visit. Wt Readings from Last 3 Encounters:  05/02/18 207 lb (93.9 kg)  03/21/18 207 lb (93.9 kg)  05/19/11 230 lb 2.6 oz (104.4 kg)   Constitutional:  in NAD  The physical exam was not performed (virtual visit).  ASSESSMENT: 1. DM1, uncontrolled, without complications - h/o DKA - mild gastroparesis - proliferative retinopathy - ESRD -history of being on HD and peritoneal dialysis for 5 years - PN  2. Hashimoto's thyroiditis  PLAN:  1. Patient with longstanding, uncontrolled, type 1 diabetes, on basal/bolus insulin regimen now, after he ran out of carbs for his insulin pump.  She was actually started on an insulin pump before the end of the year and he likes his Omnipod DASH, however, he wanted to change to a tandem T. Slim pump.  His insurance did not cover this and also required preauthorization for his Omnipod.  2 weeks ago he ran out of his pump and he switched to [place] and Humalog basal-bolus insulin regimen.  He has CBG patterns are: Sugars at or close to goal in the morning but increasing 2 hours after meals and decreasing after he boluses insulin for that meal.  Upon questioning, he is bolusing after the meal.  This is appropriate for gastroparesis, but he has a mild case, and I think he would benefit from moving them insulin doses 10 to 15 minutes before the meals.  I think this can avoid his postprandial hyperglycemia followed by precipitous decrease in blood sugars when he actually injects rapid acting insulin.  He agrees to try this. -He will switch back to the pump soon, so we discussed about the fact that he may need to decrease his insulin to carb ratio if his sugars are still high 2 hours after meals. -Before he ran out of pods, he noticed that his sugars at night were lower  and he lowered his basal rates.  However, I am concerned that he was dropping his sugars around 2 AM due to to  the fact that he was taking his rapid acting insulin after, rather than before dinner.  I think we need to increase his basal rates if his sugars started to increase after he starts taking the insulin before dinner -He is comfortable with carb counting and tells me that he read labels and uses to carb content catalog in the [place] -I suggested to: Patient Instructions  Please continue: - Tresiba 11-12 units daily - Humalog   ICR 1:12  ISF 30  Target CBG: 150  Please start the boluses 10-15 min before meals.  After you start on the pump: - basal rates: 12 am: 0.4 5 am: 1.0 - ICR: 1:12 (may need to decrease to 11 if sugars are still high after meals after moving the boluses 15 min before meals) - target: 110-120 - ISF: 30 - Insulin on Board: 4h  Please return in 3 months.  - we will check his HbA1c when he returns to the clinic  - continue checking sugars at different times of the day - check >4x a day, rotating checks - advised for yearly eye exams >> he is UTD - Return to clinic in 3 mo with sugar log    2. Hashimoto's thyroiditis - latest thyroid labs reviewed with pt >> normal 04/2018 - he continues on LT4 75 mcg daily - pt feels good on this dose. - we discussed about taking the thyroid hormone every day, with water, >30 minutes before breakfast, separated by >4 hours from acid reflux medications, calcium, iron, multivitamins. Pt. is taking it correctly. - will check thyroid tests at next OV: TSH and fT4 - If labs are abnormal, he will need to return for repeat TFTs in 1.5 months  - time spent with the patient: 25 min, of which >50% was spent in reviewing his CGM glucose levels and his insulin doses, discussing his hypo- and hyper-glycemic episodes, reviewing previous labs and pump settings and developing a plan to avoid hypo- and hyper-glycemia.   Philemon Kingdom, MD PhD [facility] Endocrinology

## 2018-10-29 ENCOUNTER — Telehealth: Payer: Self-pay | Admitting: Internal Medicine

## 2018-10-29 NOTE — Telephone Encounter (Signed)
I am not sure - may need to have a testosterone checked by PCP to see if low

## 2018-10-29 NOTE — Telephone Encounter (Signed)
Patient is asking if there is anything that can be done for issues with his libido?  Recent labs through [facility]

## 2018-10-31 ENCOUNTER — Other Ambulatory Visit: Payer: Self-pay | Admitting: Internal Medicine

## 2018-10-31 DIAGNOSIS — R6882 Decreased libido: Secondary | ICD-10-CM

## 2018-10-31 NOTE — Telephone Encounter (Signed)
Notified patient of message.  Patient is asking if Dr. Cruzita Lederer can order this since it is related to endocrine. I explained that we see him for diabetes. Patient confused and asked if pcp orders the test and is abnormal then he would have to be referred again to the endocrinologist he already sees?  Patient would prefer [place] to order the tests.

## 2018-10-31 NOTE — Telephone Encounter (Signed)
I will order the test.  He needs to come fasting, between 8 and 9 in the morning and not take any supplements before the test.  If abnormal, will need to schedule a separate appointment to discuss about it.

## 2018-11-01 NOTE — Telephone Encounter (Signed)
Notified patient of message from Dr. Gherghe, patient expressed understanding and agreement. No further questions.  

## 2018-11-09 ENCOUNTER — Other Ambulatory Visit: Payer: Self-pay

## 2018-11-09 ENCOUNTER — Other Ambulatory Visit (INDEPENDENT_AMBULATORY_CARE_PROVIDER_SITE_OTHER): Payer: BC Managed Care – PPO

## 2018-11-09 DIAGNOSIS — R6882 Decreased libido: Secondary | ICD-10-CM

## 2018-11-13 LAB — TESTOSTERONE, FREE AND TOTAL (INCLUDES SHBG)-(MALES)
% Free Testosterone: 0.5 %
Free Testosterone, S: 25 pg/mL — ABNORMAL LOW
Sex Hormone Binding Globulin: 111 nmol/L — ABNORMAL HIGH
Testosterone, Serum (Total): 494 ng/dL

## 2018-11-14 ENCOUNTER — Telehealth: Payer: Self-pay | Admitting: Internal Medicine

## 2018-11-14 NOTE — Telephone Encounter (Signed)
Patient requests to be called at ph# [phone] to be given his lab results

## 2018-11-14 NOTE — Telephone Encounter (Signed)
Dr. Cruzita Lederer please advise.

## 2018-11-15 ENCOUNTER — Other Ambulatory Visit: Payer: Self-pay | Admitting: Internal Medicine

## 2018-11-15 DIAGNOSIS — R7989 Other specified abnormal findings of blood chemistry: Secondary | ICD-10-CM

## 2018-11-15 NOTE — Telephone Encounter (Signed)
See lab message 

## 2018-11-15 NOTE — Telephone Encounter (Signed)
Notified patient of message from Dr. Cruzita Lederer, patient expressed understanding and agreement. No further questions.  Lab appt made.

## 2018-11-15 NOTE — Telephone Encounter (Signed)
Result Notes for Testosterone Free with SHBG  Notes recorded by Philemon Kingdom, MD on 11/15/2018 at 7:43 AM EDT  Daniel Hill, can you please call pt: Testosterone level is slightly low. We will need to repeat this along with other tests to also look at other hormones. However, usually, we need to wait approximately a month between measurements. I will put the labs in and he needs to return fasting, between 8 and 9 in the morning. Do not take any medicines before the labs except for diabetic medications.    Left message for patient to return our call at [phone].

## 2018-11-26 ENCOUNTER — Telehealth: Payer: Self-pay

## 2018-11-26 ENCOUNTER — Other Ambulatory Visit: Payer: Self-pay

## 2018-11-26 DIAGNOSIS — E10319 Type 1 diabetes mellitus with unspecified diabetic retinopathy without macular edema: Secondary | ICD-10-CM

## 2018-11-26 DIAGNOSIS — IMO0002 Reserved for concepts with insufficient information to code with codable children: Secondary | ICD-10-CM

## 2018-11-26 MED ORDER — DEXCOM G6 SENSOR MISC: 1.0000 | 2 refills | Status: DC

## 2018-11-26 NOTE — Telephone Encounter (Signed)
Pt returned call. States his plan does not cover Freestyle CGM but does cover Dexcom CGM. Confirmed this information is correct. Pt was informed about the need for PA for Dexcom G6 Sensors. Pt also aware that we will need to await [employer] [employer]'s response. Verbalized acceptance and understanding. PA initiated today through Cover My Meds for Dexcom G6 Sensors. Will await insurance response re: approval/denial.  Dorthula Matas KeyRubie Maid - PA Case ID: [id number] Need help? Call us at [phone] Status Sent to [employer] Dexcom G6 Sensor Form [employer] Electronic PA Form (NCPDP)

## 2018-11-26 NOTE — Telephone Encounter (Signed)
Received notification from [facility] requesting PA for Dexcom G6 Sensors. This device is not on pt medication list nor mentioned in Dr. Arman Filter note. Called pt for clarification re: this request. LVM requesting returned call. PA will remain on hold pending pt response.

## 2018-11-26 NOTE — Telephone Encounter (Signed)
Received notification from [facility] that PA for Dexcom G6 Sensors has been denied. Following reasons provided: Not covered by Plan/Plan Exclusion. Documents have been labeled and placed in scan file for HIM and for our future reference. I have also provided Leonia Reader with the denial so that she can further discuss with pt to determine if this needs to be submitted to a DME supplier.

## 2018-11-28 ENCOUNTER — Telehealth: Payer: Self-pay | Admitting: Nutrition

## 2018-11-28 NOTE — Telephone Encounter (Signed)
LVM to call me back concerning [employer]andem pump and Dexcom.  [employer] was waiting to hear from this patient when his pump expires.  They are able to get him his pump/Dexcom with his insurance, but the pump must be over 44 years old.

## 2018-12-05 ENCOUNTER — Telehealth: Payer: Self-pay | Admitting: Nutrition

## 2018-12-05 NOTE — Telephone Encounter (Signed)
Called him concerning the refusal for the Dexcom sensor.  He reports that he is now using the [place] sensor and that he wished it had alerts link the Dexcom.  I told him that the new Elenor Legato II has them.  And that we can order that one for him when he finished his prescription this month.  Note to [facility] to order the new [place].

## 2018-12-11 ENCOUNTER — Other Ambulatory Visit: Payer: Self-pay

## 2018-12-11 DIAGNOSIS — R7989 Other specified abnormal findings of blood chemistry: Secondary | ICD-10-CM

## 2018-12-11 NOTE — Addendum Note (Signed)
Addended by: Kaylyn Lim I on: 12/11/2018 08:39 AM   Modules accepted: Orders

## 2018-12-11 NOTE — Addendum Note (Signed)
Addended by: Kaylyn Lim I on: 12/11/2018 08:40 AM   Modules accepted: Orders

## 2018-12-12 ENCOUNTER — Other Ambulatory Visit: Payer: BC Managed Care – PPO

## 2018-12-14 ENCOUNTER — Telehealth: Payer: Self-pay | Admitting: Internal Medicine

## 2018-12-14 NOTE — Telephone Encounter (Signed)
Please advise on results

## 2018-12-14 NOTE — Telephone Encounter (Signed)
Patient requests to be called at ph# [phone] to be given his lab results °

## 2018-12-14 NOTE — Telephone Encounter (Signed)
I am still waiting for all the labs to come back.  Testosterone level is pending.  It usually takes several days.

## 2018-12-14 NOTE — Telephone Encounter (Signed)
Notified patient of message from Dr. Gherghe, patient expressed understanding and agreement. No further questions.  

## 2018-12-18 ENCOUNTER — Telehealth: Payer: Self-pay | Admitting: Nutrition

## 2018-12-18 LAB — TESTOSTERONE, FREE AND TOTAL (INCLUDES SHBG)-(MALES)
% Free Testosterone: 0.5 %
Free Testosterone, S: 34 pg/mL — ABNORMAL LOW
Sex Hormone Binding Globulin: 136.5 nmol/L — ABNORMAL HIGH
Testosterone, Serum (Total): 670 ng/dL

## 2018-12-18 MED ORDER — FREESTYLE LIBRE 2 READER SYSTM DEVI: 1.0000 | 0 refills | Status: DC

## 2018-12-18 MED ORDER — FREESTYLE LIBRE 2 SENSOR SYSTM MISC: 1.0000 | 2 refills | Status: DC

## 2018-12-18 NOTE — Telephone Encounter (Signed)
Please let him know that we will call him as soon as all his results return.  The testosterone test was actually sent to [place] so I am still waiting for those.

## 2018-12-18 NOTE — Telephone Encounter (Signed)
Patient called earlier asking where his prescription for the [facility] was sent.  I informed him that his other physician call in that prescription.  He requested that Dr. Cruzita Lederer call it in.  Per [name], the prescription for Daniel Hill 2 was called in with the reader to his [facility] in [place].  He was told this. He was also told that Dr. Cruzita Lederer had no had time to review his lab work, and would call him when she had.  He reported good understanding of this and had no final questions.

## 2018-12-18 NOTE — Telephone Encounter (Signed)
Patient is calling in regards to his lab results.  Also, MEDICATION: Libre 2  PHARMACY:  [facility] [phone] - [place], [place] - [address] AT [address]  IS THIS A 90 DAY SUPPLY :   IS PATIENT OUT OF MEDICATION:   IF NOT; HOW MUCH IS LEFT:   LAST APPOINTMENT DATE: @8 /19/2020  NEXT APPOINTMENT DATE:@Visit  date not found  DO WE HAVE YOUR PERMISSION TO LEAVE A DETAILED MESSAGE:  OTHER COMMENTS:    **Let patient know to contact pharmacy at the end of the day to make sure medication is ready. **  ** Please notify patient to allow 48-72 hours to process**  **Encourage patient to contact the pharmacy for refills or they can request refills through [facility]**

## 2018-12-18 NOTE — Addendum Note (Signed)
Addended by: Cardell Peach I on: 12/18/2018 02:45 PM   Modules accepted: Orders

## 2018-12-18 NOTE — Telephone Encounter (Signed)
RX sent.  Dr. Cruzita Lederer has not yet reviewed the results.

## 2018-12-21 ENCOUNTER — Other Ambulatory Visit: Payer: Self-pay | Admitting: Internal Medicine

## 2018-12-21 DIAGNOSIS — R7989 Other specified abnormal findings of blood chemistry: Secondary | ICD-10-CM

## 2018-12-21 LAB — INSULIN-LIKE GROWTH FACTOR: Insulin-Like GF-1: 43 ng/mL — ABNORMAL LOW (ref 84–270)

## 2018-12-21 LAB — ESTRADIOL, FREE
Estradiol, Serum, MS: 29 pg/mL
Free Estradiol, Percent: 1.3 %
Free Estradiol, Serum: 0.38 pg/mL

## 2018-12-21 LAB — PROLACTIN: Prolactin: 8 ng/mL (ref 4.0–15.2)

## 2018-12-21 LAB — BETA HCG QUANT (REF LAB): hCG Quant: <1 m[IU]/mL (ref 0–3)

## 2018-12-21 LAB — PSA: Prostate Specific Ag, Serum: 0.8 ng/mL (ref 0.0–4.0)

## 2018-12-21 LAB — LUTEINIZING HORMONE: LH: 4.9 m[IU]/mL (ref 1.7–8.6)

## 2018-12-21 LAB — ACTH: ACTH: 13.5 pg/mL (ref 7.2–63.3)

## 2018-12-21 LAB — CORTISOL: Cortisol: 10.7 ug/dL

## 2018-12-21 LAB — FOLLICLE STIMULATING HORMONE: FSH: 3.6 m[IU]/mL (ref 1.5–12.4)

## 2018-12-24 ENCOUNTER — Telehealth: Payer: Self-pay

## 2018-12-24 NOTE — Telephone Encounter (Signed)
Error

## 2018-12-26 ENCOUNTER — Other Ambulatory Visit: Payer: Self-pay

## 2018-12-28 ENCOUNTER — Encounter: Payer: Self-pay | Admitting: Internal Medicine

## 2018-12-28 ENCOUNTER — Other Ambulatory Visit: Payer: Self-pay

## 2018-12-28 ENCOUNTER — Ambulatory Visit: Payer: BC Managed Care – PPO | Admitting: Internal Medicine

## 2018-12-28 VITALS — BP 120/82 | HR 75 | Ht 69.0 in | Wt 217.0 lb

## 2018-12-28 DIAGNOSIS — Z87438 Personal history of other diseases of male genital organs: Secondary | ICD-10-CM | POA: Diagnosis not present

## 2018-12-28 DIAGNOSIS — R7989 Other specified abnormal findings of blood chemistry: Secondary | ICD-10-CM | POA: Diagnosis not present

## 2018-12-28 DIAGNOSIS — E063 Autoimmune thyroiditis: Secondary | ICD-10-CM | POA: Diagnosis not present

## 2018-12-28 DIAGNOSIS — E10319 Type 1 diabetes mellitus with unspecified diabetic retinopathy without macular edema: Secondary | ICD-10-CM | POA: Diagnosis not present

## 2018-12-28 DIAGNOSIS — E1065 Type 1 diabetes mellitus with hyperglycemia: Secondary | ICD-10-CM | POA: Diagnosis not present

## 2018-12-28 DIAGNOSIS — IMO0002 Reserved for concepts with insufficient information to code with codable children: Secondary | ICD-10-CM

## 2018-12-28 DIAGNOSIS — E038 Other specified hypothyroidism: Secondary | ICD-10-CM

## 2018-12-28 LAB — T4, FREE: Free T4: 1.07 ng/dL (ref 0.60–1.60)

## 2018-12-28 LAB — POCT GLYCOSYLATED HEMOGLOBIN (HGB A1C): Hemoglobin A1C: 9.7 % — AB (ref 4.0–5.6)

## 2018-12-28 LAB — IBC PANEL
Iron: 65 ug/dL (ref 42–165)
Saturation Ratios: 24.3 % (ref 20.0–50.0)
Transferrin: 191 mg/dL — ABNORMAL LOW (ref 212.0–360.0)

## 2018-12-28 LAB — PSA: PSA: 0.64 ng/mL (ref 0.10–4.00)

## 2018-12-28 LAB — TSH: TSH: 2.62 u[IU]/mL (ref 0.35–4.50)

## 2018-12-28 NOTE — Patient Instructions (Addendum)
Please change: - basal rates: 12 am: 0.5 >> 0.8 5 am: 0.9 3 pm: 0.8 >> 0.9 6 pm: 0.9 >> 1.0 - ICR  12 am: 1:13 >> 1:7  11 am: 1:17 >> 1:7 - target: 110-120 - ISF: 50 - active insulin time: 4h  Please stop at the lab.  Please return in 3 months with your sugar log.

## 2018-12-28 NOTE — Progress Notes (Addendum)
Patient ID: Daniel Hill, male   DOB: September 24, 1975, 43 y.o.   MRN: [id number]   HPI: Daniel Hill is a 43 y.o.-year-old male, initially referred by his nephrologist, Daniel Hill, presenting for follow-up for DM1, uncontrolled, with complications (DKA, mild gastroparesis, proliferative retinopathy, ESRD -history of being on HD and peritoneal dialysis for 5 years, PN). Last visit 3 months ago (virtual).  He was diagnosed with COVID-19 in 06/2018.  He recovered well, but at last visit he was still having fatigue with exertion.  At this visit, he also complains of lack of motivation and interest in doing things.  He was dx'ed with OSA >> started on Cpap.  He is now back on insulin pump-Omnipod.  His sugars are much worse compared to last visit.  He is not introducing his sugars into the pump, not entering carbs and not bolusing consistently using the bolus wizard.  DM1: Reviewed history: Patient has been diagnosed with diabetes at 43 y/o. Hill was prev. Seen by Daniel Hill.  Hill is status post kidney-pancreatic transplant in 2003, but his pancreas transplant failed in 2013.  He is considered too high risk for a new pancreatic transplant.  His retinopathy and gastroparesis improved after transplant.  He had several eye surgeries (vitrectomy and scleral buckle procedures).  He has chronic blindness in his left eye.  Latest HbA1c level reviewed: Lab Results  Component Value Date   HGBA1C 8.9 (A) 03/21/2018   HGBA1C 9.9 (H) 05/19/2011  07/15/2016: HbA1c 7.8%  He was on the previous regimen: - Tresiba 10 units at bedtime - Humalog - ICR 1:15, target 120, ISF 40 - injects after meals (ends up with 2-6 units per meal)  He was on: Omni pod with freestyle libre CGM >> at last visit, we discussed about switching to a T slim, which he wanted to do, however, this was not covered by his insurance.  We had to submit a preauthorization for Omni pod pump.  This was approved.  He also obtained the  freestyle libre 2 recently.  Unfortunately, we could not download the CGM today, but we downloaded his pump.  We will scan the reports. He uses NovoLog in the pump.  Pump settings (changes that he made since last visit are shown in bold): - basal rates: 12 am: 0.8 >> 0.5 (he mentions he decreased this because he was dropping his sugars during the night). 5 am: 1.0 >> 0.9 3 pm: 0.8 6 pm: 0.9 - ICR: 1:2 >>   12 am: 1:13  11 am: 1:17 - target: 110-120 - ISF: 30 >> 50 - active insulin time: 4h TDD from basal insulin: ~11 >> 17-19 units/day TDD from bolus insulin: ~21 units >> 4-14 units/day  Glucose levels:  - am: 106-145, 180 >> 221-352 - 2h after b'fast: 220 >> 332, 337 - lunch: 120 >> 151-151, 193-363 - 2h after lunch: n/c >> 286-387 - dinner: 120 >> 276-367 - 2h after dinner: 200s >> 230, 427 - bedtime: n/c  Lowest sugar was 40-50s >> 96 >> 60 >> 151; he has hypoglycemia awareness at 60-70.  No previous hypoglycemia admission.  He has a non-expired glucagon kit at home. Highest sugar was HI >> 400s >> 400 x5 >> 461.  He had DKA admissions in the past but latest before his renal transplant.  Pt's meals are: - Breakfast: eggs or oatmeal - Lunch: meat and veggies, sandwich, soup - Dinner: mostly meat and veggies, sometimes pasta and bread - Snacks: 2  -  usually apples or beef jerky.  -+ h/o ESRD - [facility]; now s/p renal transplant, last BUN/creatinine:  Lab Results  Component Value Date   BUN 13 07/28/2018   BUN 16 05/02/2018   CREATININE 0.95 07/28/2018   CREATININE 0.97 05/02/2018  02/2018: Cr 0.8  -+ HL; last set of lipids: Lab Results  Component Value Date   CHOL 150 05/02/2018   HDL 37.40 (L) 05/02/2018   LDLCALC 96 05/02/2018   TRIG 83.0 05/02/2018   CHOLHDL 4 05/02/2018  On Fish oil.  - last eye exam was in 2019: + DR  - +  Numbness and tingling in his feet.  He was previously on Reglan and he has intolerance to this.  Pt has FH of DM in  father.  Hashimoto's thyroiditis:  Pt is on levothyroxine 75 mcg daily, taken: - in am - fasting - at least 30 min from b'fast - no Ca, Fe, MVI, PPIs - not on Biotin  Latest TSH was normal: Lab Results  Component Value Date   TSH 1.83 05/02/2018   Pt denies: - feeling nodules in neck - hoarseness - dysphagia - choking - SOB with lying down  He has a family history of thyroid cancer.  He takes a vitamin D supplement.  Since last visit, he called the clinic for decreased libido.  We checked a testosterone level and a total level was normal, however, the free testosterone level was low x2.  Further investigation for hypogonadotropic hypogonadism revealed normal pituitary hormones with the exception of a  low IGF-I.  This is the first visit with me for this problem.  Reviewed pituitary work-up: Component     Latest Ref Rng & Units 11/09/2018 12/12/2018  Testosterone, Serum (Total)     ng/dL 494 670  % Free Testosterone     % 0.5 0.5  Free Testosterone, S     pg/mL 25 (L) 34 (L)  Sex Hormone Binding Globulin     nmol/L 111.0 (H) 136.5 (H)  Estradiol     pg/mL  29  Free Estradiol, Percent     %  1.3  Free Estradiol, Serum     pg/mL  0.38  hCG Quant     0 - 3 mIU/mL  <1  Prolactin     4.0 - 15.2 ng/mL  8.0  LH     1.7 - 8.6 mIU/mL  4.9  FSH     1.5 - 12.4 mIU/mL  3.6  Insulin-Like GF-1     84 - 270 ng/mL  43 (L)  Prostate Specific Ag, Serum     0.0 - 4.0 ng/mL  0.8  Cortisol     ug/dL  10.7   I ordered a pituitary MRI which is now pending.  At this visit, he mentions that he was found to have a low testosterone approximately 10 years ago.  He was tried on testosterone gel and then, but came off afterwards.  He then saw Daniel Hill who also checked the testosterone and patient was told that this was normal.    In case his testosterone will need to be supplemented, he would not want to retry this as he is concerned about transfer to wife and children.  He would  prefer injections if testosterone supplementation is needed in the future.  His wife got pregnant after fertility tx at [facility]. Pt's sperm count was reportedly normal, but on a lower side.  He admits for decreased libido Has no  difficulty obtaining  but sometimes has problems maintaining an erection No trauma to testes, testicular irradiation but had surgery for L hydrocele.  He mentions that after the surgery he developed a perineal leak (between scrotum and the rectum). He also has problems initiating urine flow  No h/o of mumps orchitis/h/o autoimmune ds. No h/o cryptorchidism He grew and went through puberty after his peers - college + mild shrinking of testes. No very small testes (<5 ml).  No incomplete/delayed sexual development     No breast discomfort/+ gynecomastia (since he was on dialysis).    No loss of body hair (axillary/pubic)/decreased need for shaving + height loss - 2 inches Lately + abnormal sense of smell  + hot flushes when his blood sugars are low No vision problems No worst HA of his life No FH of hypogonadism/infertility  No personal h/o infertility - has children No FH of hemochromatosis or pituitary tumors No excessive weight gain or loss.  No chronic pain. Not on opiates, but Tramadol bid. He does not take steroids.  No alcohol No anabolic steroids use No herbal medicines + antidepressants: Lexapro No AI ds in his family.  Prev. Saw Dr. Kathlen Mody (urology).   ROS: Constitutional: no weight gain/no weight loss, + fatigue, + subjective hyperthermia, no subjective hypothermia Eyes: no blurry vision, no xerophthalmia ENT: no sore throat, + see HPI Cardiovascular: no CP/no SOB/no palpitations/no leg swelling Respiratory: no cough/no SOB/no wheezing Gastrointestinal: no N/no V/no D/no C/no acid reflux Musculoskeletal: no muscle aches/no joint aches Skin: no rashes, + hair loss Neurological: + tremors (chronic, from Prograf)/no numbness/no tingling/no  dizziness  I reviewed pt's medications, allergies, PMH, social hx, family hx, and changes were documented in the history of present illness. Otherwise, unchanged from my initial visit note.  Past Medical History:  Diagnosis Date  . Anxiety   . Depression   . Diabetes mellitus   . GERD (gastroesophageal reflux disease)   . Hypothyroidism   . Kidney replaced by transplant   . Pancreas transplanted ([place])   . Thyroid disease    Past Surgical History:  Procedure Laterality Date  . EYE SURGERY     Social History   Socioeconomic History  . Marital status: Married    Spouse name: Not on file  . children: yes  . Years of education: Not on file  . 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 Smoking status: Never Smoker  . Smokeless tobacco: Never Used  Substance and Sexual Activity  . Alcohol use:  Beer very seldom  . Drug use: No   Current Outpatient Medications on File Prior to Visit  Medication Sig Dispense Refill  . Continuous Blood Gluc Receiver (FREESTYLE LIBRE 2 READER SYSTM) DEVI 1 each by Does not apply route See admin instructions. Libre 2 freestyle device. 1 kit 0  . Continuous Blood Gluc Sensor (FREESTYLE LIBRE 2 SENSOR SYSTM) MISC 1 each by Does not apply route every 14 (fourteen) days. 6 each 2  . escitalopram (LEXAPRO) 10 MG tablet Take 10 mg by mouth 2 (two) times daily.    . Glucagon (BAQSIMI TWO PACK) 3 MG/DOSE POWD Place 3 mg into the nose once as needed for up to 1 dose. (Patient not taking: Reported on 07/28/2018) 1 each 11  . insulin aspart (NOVOLOG) 100 UNIT/ML injection Use up to 100 units per day in insulin pump 3 vial 11  . Insulin Disposable Pump (OMNIPOD DASH 5 PACK  PODS) MISC 6 Packages by Does not apply route every 3 (three) days. 30 each 3  . levothyroxine (SYNTHROID, LEVOTHROID) 75 MCG tablet Take 1 tablet (75 mcg total) by mouth daily. 90 tablet 3  . Multiple Vitamins-Minerals  (MULTIVITAMINS THER. W/MINERALS) TABS Take 1 tablet by mouth daily.    . ondansetron (ZOFRAN) 4 MG tablet Take 1 tablet (4 mg total) by mouth every 8 (eight) hours as needed for nausea or vomiting. 15 tablet 0  . ondansetron (ZOFRAN) 4 MG tablet Take 1 tablet (4 mg total) by mouth every 8 (eight) hours as needed for nausea or vomiting. 20 tablet 0  . tacrolimus (PROGRAF) 1 MG capsule Take 3 mg by mouth 2 (two) times daily.    . traMADol (ULTRAM) 50 MG tablet Take 50-100 mg by mouth every 6 (six) hours as needed for pain.    . TRESIBA FLEXTOUCH 100 UNIT/ML SOPN FlexTouch Pen Inject 10 Units into the skin daily.   6  . Vitamin D, Ergocalciferol, (DRISDOL) 1.25 MG (50000 UT) CAPS capsule Take 50,000 Units by mouth every Saturday.      No current facility-administered medications on file prior to visit.     Allergies  Allergen Reactions  . Codeine Nausea And Vomiting  . Metoclopramide Hcl Nausea And Vomiting   Family History  Problem Relation Age of Onset  . Stroke Mother   . Lymphoma Father   . Diabetes type II Father    PE: BP 120/82   Pulse 75   Ht 5' 9" (1.753 m)   Wt 217 lb (98.4 kg)   SpO2 98%   BMI 32.05 kg/m  Wt Readings from Last 3 Encounters:  12/28/18 217 lb (98.4 kg)  05/02/18 207 lb (93.9 kg)  03/21/18 207 lb (93.9 kg)   Constitutional: overweight, in NAD Eyes: PERRLA, EOMI, no exophthalmos ENT: moist mucous membranes, no thyromegaly, no cervical lymphadenopathy Cardiovascular: RRR, No MRG Respiratory: CTA B Gastrointestinal: abdomen soft, NT, ND, BS+ Musculoskeletal: no deformities, strength intact in all 4 Skin: moist, warm, no rashes Neurological: no tremor with outstretched hands, DTR normal in all 4 Genital exam: Deferred  ASSESSMENT: 1. DM1, uncontrolled, without complications - h/o DKA - mild gastroparesis - proliferative retinopathy - ESRD -history of being on HD and peritoneal dialysis for 5 years - PN  2. Hashimoto's thyroiditis  3. Low free  testosterone  4. Low IGF1  PLAN:  1. Patient with longstanding, uncontrolled, type 1 diabetes, on basal-bolus insulin regimen at last visit but now back on his insulin pump (Omnipod DASH).  However, since he went back on the pump, he has not been doing well with entering his sugars into the pump, entering carbs, and bolusing with meals.  Pump downloads, he is barely entering any carbs and not using his bolus wizard.  We discussed about the importance of doing so.  Moreover, when he started the pump, he did not enter the pump settings that I suggested at last visit.  He tells me that he developed low blood sugars at night after he started the pump so we had to reduce the basal rate so he is using now very low basal rate midnight, 0.5 units daily.  Subsequently, sugars are high, in the 200s to 300s and even 400s throughout the day. -At this visit, we increased his basal rate and also decrease his insulin to carb ratios, which are much higher than what he needs.  We will leave the insulin sensitivity factor and target the same. -  He is determined to get back on track with using his bolus wizard consistently and I advised him to enter carbs, blood sugars, start the boluses 15 minutes before each meal -I suggested to: Patient Instructions  Please change: - basal rates: 12 am: 0.5 >> 0.8 5 am: 0.9 3 pm: 0.8 >> 0.9 6 pm: 0.9 >> 1.0 - ICR  12 am: 1:13 >> 1:7  11 am: 1:17 >> 1:7 - target: 110-120 - ISF: 50 - active insulin time: 4h  Please stop at the lab.  Please return in 3 months with your sugar log.   - we checked his HbA1c higher today: Lab Results  Component Value Date   HGBA1C 9.7 (A) 12/28/2018  - advised to check sugars at different times of the day - 4x a day, rotating check times - advised for yearly eye exams >> he is UTD - return to clinic in 3-4 months    2. Hashimoto's thyroiditis - latest thyroid labs reviewed with pt >> normal 04/2018 - he continues on LT4 75 mcg daily -  pt feels good on this dose. - we discussed about taking the thyroid hormone every day, with water, >30 minutes before breakfast, separated by >4 hours from acid reflux medications, calcium, iron, multivitamins. Pt. is taking it correctly. - will check thyroid tests today: TSH and fT4 - If labs are abnormal, he will need to return for repeat TFTs in 1.5 months  3. Low free testosterone -This was recently found during investigation for low libido.  His total testosterone is normal while SHBG is high and free testosterone is low.  It looks like the free testosterone is low due to the high SHBG, which binds the majority of his testosterone.  High SHBG could be found in the setting of low growth hormone, but also in HIV infections, alcoholism, liver disease (he denies all of these), high estrogen level (his estrogen level was normal), eating disorders (he denies), hyperthyroidism (he has controlled hypothyroidism).  OSA is another known cause of hypogonadism and he started to improve this with using the CPAP. -His LH and FSH were inappropriately normal >> mild hypogonadotropic hypogonadism -We are waiting for his pituitary MRI to return -Due to his previous testicular surgery and the "leak" that he perceives in the perineum and also because he may have BPH (problems initiated urine stream), I would like to refer him to urology for an evaluation before we start testosterone treatment.  He agrees with this and would like to defer his genital exam for now until seen by urologist.  -We will check a PSA today -We discussed about possible testosterone replacement -we should avoid testosterone gel per his preference and try intramuscular injections.  4. Low IGF1 -Patient had a low IGF-I during pituitary work-up.  We will repeat this today. -Possible causes are: The pituitary tumor (suspicion is high not high, though, due to the rest of the pituitary hormones being normal).  We will await the results of the MRI.   Other possible causes: Hereditary low growth hormone, high estrogen level (however, his estrogen level was normal), nutritional deficiencies (he denies a restrictive diet, chronic kidney or liver disease-I do not see evidence of liver disease but he does have a history of ESRD and now has a functioning kidney transplant).  - time spent with the patient: 45 min, of which >50% was spent in reviewing his pump and CGM downloads, discussing his hypo- and hyper-glycemic episodes, reviewing previous labs and pump settings and developing  a plan to avoid hypo- and hyper-glycemia.  We also addressed his new diagnosis of hypogonadism and low IGF-I and also his Hashimoto's thyroiditis.  Office Visit on 12/28/2018  Component Date Value Ref Range Status  . IGF-I, LC/MS 12/28/2018 32* 52 - 328 ng/mL Final  . Z-Score (Male) 12/28/2018 -2.8* -2.0 - 2 SD Final   Comment: . This test was developed and its analytical performance characteristics have been determined by [facility]. It has not been cleared or approved by FDA. This assay has been validated pursuant to the CLIA regulations and is used for clinical purposes. .   . Iron 12/28/2018 65  42 - 165 ug/dL Final  . Transferrin 12/28/2018 191.0* 212.0 - 360.0 mg/dL Final  . Saturation Ratios 12/28/2018 24.3  20.0 - 50.0 % Final  . HIV 1&2 Ab, 4th Generation 12/28/2018 NON-REACTIVE  NON-REACTI Final   Comment: HIV-1 antigen and HIV-1/HIV-2 antibodies were not detected. There is no laboratory evidence of HIV infection. Marland Kitchen PLEASE NOTE: This information has been disclosed to you from records whose confidentiality may be protected by state law.  If your state requires such protection, then the state law prohibits you from making any further disclosure of the information without the specific written consent of the person to whom it pertains, or as otherwise permitted by law. A general authorization for the release  of medical or other information is NOT sufficient for this purpose. . For additional information please refer to http://education.questdiagnostics.com/faq/FAQ106 (This link is being provided for informational/ educational purposes only.) . Marland Kitchen The performance of this assay has not been clinically validated in patients less than 57 years old. .   . PSA 12/28/2018 0.64  0.10 - 4.00 ng/mL Final   Test performed using Access Hybritech PSA Assay, a parmagnetic partical, chemiluminecent immunoassay.  Marland Kitchen TSH 12/28/2018 2.62  0.35 - 4.50 uIU/mL Final  . Free T4 12/28/2018 1.07  0.60 - 1.60 ng/dL Final   Comment: Specimens from patients who are undergoing biotin therapy and /or ingesting biotin supplements may contain high levels of biotin.  The higher biotin concentration in these specimens interferes with this Free T4 assay.  Specimens that contain high levels  of biotin may cause false high results for this Free T4 assay.  Please interpret results in light of the total clinical presentation of the patient.    . Growth Hormone 12/28/2018 0.2  < OR = 7.1 ng/mL Final   Comment: . Because of a pulsatile secretion pattern, random (unstimulated) growth hormone (GH) levels are  frequently undetectable in normal children and adults and are not reliable for diagnosing GH deficiency. Regarding suppression tests, failure to suppress GH is diagnostic of acromegaly. . Typical GH response in healthy subjects:   Using the glucose tolerance (GH suppression) test,   acromegaly is ruled out if the patient's [place] level    is <1.0 ng/mL at any point in the timed sequence.   [Katznelson L, Laws Jr ER, Melmed S, et al.   Acromegaly: an Endocrine Society Clinical Practice   Guideline. J Clin Endocrinol Metab 2014; 99: 3933-   3951].   Using [facility] stimulation testing, the following result   at any point in the timed sequence makes [place]   deficiency unlikely:    Adults (> or = 20 years):       Insulin Hypoglycemia  >  or = 5.1 ng/mL       Arginine/GHRH         >  or = 4.1 ng/mL       Glucagon              > or = 3.0 ng/mL    Children (< 20 years):       All Stimulat                          ion Tests > or = 10.0 ng/mL   . Hemoglobin A1C 12/28/2018 9.7* 4.0 - 5.6 % Final   Labs are normal (HIV test, iron studies, PSA, thyroid tests) with the exception of  low growth hormone and IGF-I, which was also seen on the previous labs.  We will wait for the results of the MRI and the urology consult.  Philemon Kingdom, MD PhD [facility]

## 2019-01-02 LAB — HIV ANTIBODY (ROUTINE TESTING W REFLEX): HIV 1&2 Ab, 4th Generation: NONREACTIVE

## 2019-01-02 LAB — INSULIN-LIKE GROWTH FACTOR
IGF-I, LC/MS: 32 ng/mL — ABNORMAL LOW (ref 52–328)
Z-Score (Male): -2.8 SD — ABNORMAL LOW (ref ?–2.0)

## 2019-01-02 LAB — GROWTH HORMONE: Growth Hormone: 0.2 ng/mL (ref <=7.1)

## 2019-01-03 ENCOUNTER — Telehealth: Payer: Self-pay

## 2019-01-03 NOTE — Telephone Encounter (Signed)
-----   Message from Philemon Kingdom, MD sent at 01/02/2019  1:38 PM EDT ----- Daniel Hill, can you please call pt: Labs are normal (HIV test, iron studies, PSA, thyroid test) with the exception of  low growth hormone, which was also seen on the previous labs.  We will wait for the results of the MRI and the urology consult.

## 2019-01-04 NOTE — Telephone Encounter (Signed)
Notified patient of message from Dr. Gherghe, patient expressed understanding and agreement. No further questions.  

## 2019-01-10 ENCOUNTER — Telehealth: Payer: Self-pay | Admitting: Internal Medicine

## 2019-01-10 NOTE — Telephone Encounter (Signed)
Patient called re: Referral to Dr. Jeffie Pollock at [facility]. [facility] has not received the Referral. Please do not use Epic for they do not use Epic. Please use prestige or please fax Referral to [facility]-Dr. Jeffie Pollock.

## 2019-01-11 NOTE — Telephone Encounter (Signed)
Faxed office notes and labs to Dr. Jeffie Pollock ([facility]) [phone]

## 2019-01-17 ENCOUNTER — Ambulatory Visit
Admission: RE | Admit: 2019-01-17 | Discharge: 2019-01-17 | Disposition: A | Discharge status: Home / Self Care | Payer: BC Managed Care – PPO | Source: Ambulatory Visit | Attending: Internal Medicine | Admitting: Internal Medicine

## 2019-01-17 DIAGNOSIS — R7989 Other specified abnormal findings of blood chemistry: Secondary | ICD-10-CM

## 2019-01-17 MED ORDER — GADOBENATE DIMEGLUMINE 529 MG/ML IV SOLN
10.0000 mL | Freq: Once | INTRAVENOUS | Status: AC | PRN
  Administered 2019-01-17: 15:00:00 10 mL via INTRAVENOUS

## 2019-01-21 ENCOUNTER — Telehealth: Payer: Self-pay

## 2019-01-21 NOTE — Telephone Encounter (Signed)
-----   Message from Philemon Kingdom, MD sent at 01/18/2019 12:31 PM EDT ----- [name], can you please call pt: Daniel Hill, Daniel is excellent.  We will now wait for Daniel urology evaluation.

## 2019-01-21 NOTE — Telephone Encounter (Signed)
Notified patient of message from Dr. Gherghe, patient expressed understanding and agreement. No further questions.  

## 2019-01-30 ENCOUNTER — Other Ambulatory Visit: Payer: Self-pay | Admitting: Internal Medicine

## 2019-01-30 ENCOUNTER — Telehealth: Payer: Self-pay | Admitting: Internal Medicine

## 2019-01-30 DIAGNOSIS — R7989 Other specified abnormal findings of blood chemistry: Secondary | ICD-10-CM

## 2019-01-30 NOTE — Telephone Encounter (Signed)
Chart notes from urology placed on Dr. Cruzita Lederer desk.

## 2019-01-30 NOTE — Telephone Encounter (Signed)
M, I reviewed the notes from urologist and it appears that there should not be any problem with him receiving testosterone treatment.  I ordered another testosterone level to be done since his last level was from 2 months ago.  He needs to come between 8 and 9 in the morning, fasting, as he did before.  Once I get this, we can send an application for testosterone treatment to his insurance.

## 2019-01-30 NOTE — Telephone Encounter (Signed)
Patient called about the results of his labs and what he stated was "prostate results" from his last vist.    Patient requested a call back @ [phone]

## 2019-01-31 ENCOUNTER — Other Ambulatory Visit: Payer: Self-pay

## 2019-01-31 DIAGNOSIS — R7989 Other specified abnormal findings of blood chemistry: Secondary | ICD-10-CM

## 2019-01-31 NOTE — Addendum Note (Signed)
Addended by: Kaylyn Lim I on: 01/31/2019 10:10 AM   Modules accepted: Orders

## 2019-01-31 NOTE — Telephone Encounter (Signed)
Daniel Hill this patient needs the lab orders sent to [facility] in [place], can you send those over? Dr. Cruzita Lederer already put in the orders.  Notified patient of message from Dr. Cruzita Lederer, patient expressed understanding and agreement. No further questions.

## 2019-02-07 ENCOUNTER — Telehealth: Payer: Self-pay

## 2019-02-07 NOTE — Telephone Encounter (Signed)
We had multiple calls from this patient for labs.  The testosterone is still pending, LH is normal.  I advised him in the past that I will send results to him as soon as I receive them.  However, for faster communication, I would strongly advise him to join my chart. A prescription for testosterone and possible PA will be sent to the pharmacy depending on the new results.

## 2019-02-09 LAB — TESTOSTERONE, FREE AND TOTAL (INCLUDES SHBG)-(MALES)
% Free Testosterone: 0.4 %
Free Testosterone, S: 25 pg/mL — ABNORMAL LOW
Sex Hormone Binding Globulin: 119.1 nmol/L — ABNORMAL HIGH
Testosterone, Serum (Total): 628 ng/dL

## 2019-02-09 LAB — LUTEINIZING HORMONE: LH: 4.9 m[IU]/mL (ref 1.7–8.6)

## 2019-02-11 ENCOUNTER — Other Ambulatory Visit: Payer: Self-pay | Admitting: Internal Medicine

## 2019-02-11 ENCOUNTER — Encounter: Payer: Self-pay | Admitting: Internal Medicine

## 2019-02-11 DIAGNOSIS — E230 Hypopituitarism: Secondary | ICD-10-CM

## 2019-02-11 MED ORDER — "SYRINGE/NEEDLE (DISP) 25G X 5/8"" 1 ML MISC": 1 refills | Status: DC

## 2019-02-11 MED ORDER — TESTOSTERONE CYPIONATE 200 MG/ML IM SOLN: 50.0000 mg | INTRAMUSCULAR | 3 refills | Status: DC

## 2019-02-11 NOTE — Telephone Encounter (Signed)
RX faxed

## 2019-02-13 ENCOUNTER — Telehealth: Payer: Self-pay

## 2019-02-13 NOTE — Telephone Encounter (Signed)
Prior authorization for Testosterone injections has been approved by patient's insurance.  Coverage is effective 02/12/2019 to 02/11/2022   Approval letter has been sent to scanning.

## 2019-02-21 DIAGNOSIS — L03315 Cellulitis of perineum: Secondary | ICD-10-CM | POA: Diagnosis not present

## 2019-02-21 DIAGNOSIS — E039 Hypothyroidism, unspecified: Secondary | ICD-10-CM | POA: Diagnosis not present

## 2019-02-21 DIAGNOSIS — Z9483 Pancreas transplant status: Secondary | ICD-10-CM | POA: Diagnosis not present

## 2019-02-21 DIAGNOSIS — E1165 Type 2 diabetes mellitus with hyperglycemia: Secondary | ICD-10-CM | POA: Diagnosis not present

## 2019-02-22 DIAGNOSIS — E1165 Type 2 diabetes mellitus with hyperglycemia: Secondary | ICD-10-CM | POA: Diagnosis not present

## 2019-02-22 DIAGNOSIS — L03315 Cellulitis of perineum: Secondary | ICD-10-CM | POA: Diagnosis not present

## 2019-02-22 DIAGNOSIS — Z9483 Pancreas transplant status: Secondary | ICD-10-CM | POA: Diagnosis not present

## 2019-02-22 DIAGNOSIS — E039 Hypothyroidism, unspecified: Secondary | ICD-10-CM | POA: Diagnosis not present

## 2019-02-23 DIAGNOSIS — Z9483 Pancreas transplant status: Secondary | ICD-10-CM | POA: Diagnosis not present

## 2019-02-23 DIAGNOSIS — L03315 Cellulitis of perineum: Secondary | ICD-10-CM | POA: Diagnosis not present

## 2019-02-23 DIAGNOSIS — E1165 Type 2 diabetes mellitus with hyperglycemia: Secondary | ICD-10-CM | POA: Diagnosis not present

## 2019-02-23 DIAGNOSIS — E039 Hypothyroidism, unspecified: Secondary | ICD-10-CM | POA: Diagnosis not present

## 2019-03-12 ENCOUNTER — Encounter: Payer: Self-pay | Admitting: Internal Medicine

## 2019-04-02 ENCOUNTER — Other Ambulatory Visit: Payer: Self-pay

## 2019-04-03 ENCOUNTER — Other Ambulatory Visit: Payer: Self-pay | Admitting: Internal Medicine

## 2019-04-03 ENCOUNTER — Encounter: Payer: Self-pay | Admitting: Internal Medicine

## 2019-04-03 ENCOUNTER — Ambulatory Visit: Payer: BC Managed Care – PPO | Admitting: Internal Medicine

## 2019-04-03 VITALS — BP 130/60 | HR 71 | Ht 69.0 in | Wt 216.0 lb

## 2019-04-03 DIAGNOSIS — E1065 Type 1 diabetes mellitus with hyperglycemia: Secondary | ICD-10-CM

## 2019-04-03 DIAGNOSIS — R7989 Other specified abnormal findings of blood chemistry: Secondary | ICD-10-CM

## 2019-04-03 DIAGNOSIS — E10319 Type 1 diabetes mellitus with unspecified diabetic retinopathy without macular edema: Secondary | ICD-10-CM

## 2019-04-03 DIAGNOSIS — IMO0002 Reserved for concepts with insufficient information to code with codable children: Secondary | ICD-10-CM

## 2019-04-03 DIAGNOSIS — E230 Hypopituitarism: Secondary | ICD-10-CM | POA: Diagnosis not present

## 2019-04-03 DIAGNOSIS — E063 Autoimmune thyroiditis: Secondary | ICD-10-CM

## 2019-04-03 DIAGNOSIS — E038 Other specified hypothyroidism: Secondary | ICD-10-CM

## 2019-04-03 LAB — POCT GLYCOSYLATED HEMOGLOBIN (HGB A1C): Hemoglobin A1C: 8.7 % — AB (ref 4.0–5.6)

## 2019-04-03 NOTE — Patient Instructions (Signed)
Please  continue: - basal rates: 12 am: 0.8 5 am: 0.9 3 pm: 0.9 6 pm: 1.0 - ICR  12 am: 1:7  11 am: 1:7 - target: 110-120 - ISF: 50 - active insulin time: 4h  But bolus 15 min before the meal.  Please continue testosterone 50 mg every 2 weeks.  Please come back for labs mid-way between testsoterone injections  Please return in 3 months.

## 2019-04-03 NOTE — Progress Notes (Addendum)
Patient ID: Daniel Hill, male   DOB: Mar 31, 1976, 43 y.o.   MRN: [id number]   This visit occurred during the SARS-CoV-2 public health emergency.  Safety protocols were in place, including screening questions prior to the visit, additional usage of staff PPE, and extensive cleaning of exam room while observing appropriate contact time as indicated for disinfecting solutions.   HPI: Daniel Hill is a 43 y.o.-year-old male, initially referred by his nephrologist, Dr. Jimmy Footman, presenting for follow-up for DM1, uncontrolled, with complications (DKA, mild gastroparesis, proliferative retinopathy, ESRD -history of being on HD and peritoneal dialysis for 5 years, PN). Last visit 3 months ago.  He had COVID-19 in 06/2018.  He recovered well.  He started Testosterone inj since last visit (01/2019). He feels much better after he started this!  DM1: Reviewed history: Patient has been diagnosed with diabetes at 43 y/o. She was prev. Seen by [facility] endocrinology.  She is status post kidney-pancreatic transplant in 2003, but his pancreas transplant failed in 2013.  He is considered too high risk for a new pancreatic transplant.  His retinopathy and gastroparesis improved after transplant.  He had several eye surgeries (vitrectomy and scleral buckle procedures).  He has chronic blindness in his left eye.  Latest HbA1c levels reviewed: Lab Results  Component Value Date   HGBA1C 9.7 (A) 12/28/2018   HGBA1C 8.9 (A) 03/21/2018   HGBA1C 9.9 (H) 05/19/2011  07/15/2016: HbA1c 7.8%  He was on the previous regimen: - Tresiba 10 units at bedtime - Humalog - ICR 1:15, target 120, ISF 40 - injects after meals (ends up with 2-6 units per meal)  He is on: He is now back on the Omni pod pump. At last visit, he also just obtained the freestyle libre 2 CGM.  We could not download the pump today but we downloaded his CGM.  We will scan the reports. He uses NovoLog in the pump.  Pump settings: (Both, changes made at  last visit) - basal rates: 12 am: 0.5 >> 0.8 5 am: 0.9 3 pm: 0.8 >> 0.9 6 pm: 0.9 >> 1.0 - ICR  12 am: 1:13 >> 1:7  11 am: 1:17 >> 1:7 - target: 110-120 - ISF: 50 - active insulin time: 4h  TDD from basal insulin: ~11 >> 17-19 units/day >> ? TDD from bolus insulin: ~21 units >> 4-14 units/day >> ?  Pt.checks his sugars more than 4 times a day with his CGM.  Freestyle libre CGM parameters: - Average: 95% - Glucose management indicator for the last 2 weeks: 8.3% - % active CGM time: 95% of the time - Glucose variability 40.8% (target < or = to 36%) - time in range:  - very low (<54): 0% - low (54-69): 3% - normal range (70-180): 36% - high sugars (181-250): 33% - very high sugars (>250): 28%  Glucose levels:  Previously:   Lowest sugar was 40-50s >> 96 >> 60 >> 151 >> 50; he has hypoglycemia awareness at 6070.  No previous hypoglycemia admission.  He has a nonexpired glucagon kit at home. Highest sugar was HI >> 400s >> 400 x5 >> 461 >> >400  He had DKA admissions in the past but latest before his renal transplant.  Pt's meals are: - Breakfast: eggs or oatmeal - Lunch: meat and veggies, sandwich, soup - Dinner: mostly meat and veggies, sometimes pasta and bread - Snacks: 2  - usually apples or beef jerky.  -+ History of ESRD, now status post renal transplant- Dr.  Deterding, last BUN/creatinine:  Lab Results  Component Value Date   BUN 13 07/28/2018   BUN 16 05/02/2018   CREATININE 0.95 07/28/2018   CREATININE 0.97 05/02/2018  02/2018: Cr 0.8  -+ HL; last set of lipids: Lab Results  Component Value Date   CHOL 150 05/02/2018   HDL 37.40 (L) 05/02/2018   LDLCALC 96 05/02/2018   TRIG 83.0 05/02/2018   CHOLHDL 4 05/02/2018  On fish oil.  - last eye exam was in 2019: + DR. Has one coming up this mo. he is seen at [facility].  -He does have numbness and tingling in his feet.  He was previously on Reglan and he has intolerance to this.  Pt has FH of DM in  father.  Hashimoto's thyroiditis:  Pt is on levothyroxine 75 mcg daily, taken: - in am - fasting - at least 30 min from b'fast - no Ca, Fe, MVI, PPIs - not on Biotin  Latest TSH was normal: Lab Results  Component Value Date   TSH 2.62 12/28/2018   Pt denies: - feeling nodules in neck - hoarseness - dysphagia - choking - SOB with lying down  He has a family history of thyroid cancer  He is on a vitamin D supplement.  Hypogonadotropic hypogonadism:  Before last visit, he calls the clinic to report decreased libido.  He came to the clinic for a testosterone check and the free testosterone level was low, as well as her IGF-I.  Reviewed his hypogonadism work-up -total testosterone normal with low free testosterone.  No signs of hemochromatosis.  HIV nonreactive, normal PSA, normal hCG and prolactin, normal cortisol: Component     Latest Ref Rng & Units 12/28/2018 02/01/2019  Testosterone, Serum (Total)     ng/dL  628  % Free Testosterone     %  0.4  Free Testosterone, S     pg/mL  25 (L)  Sex Hormone Binding Globulin     nmol/L  119.1 (H)  Iron     42 - 165 ug/dL 65   Transferrin     212.0 - 360.0 mg/dL 191.0 (L)   Saturation Ratios     20.0 - 50.0 % 24.3   IGF-I, LC/MS     52 - 328 ng/mL 32 (L)   Z-Score (Male)     -2.0 - 2 SD -2.8 (L)   HIV     NON-REACTI NON-REACTIVE   PSA     0.10 - 4.00 ng/mL 0.64   LH     1.7 - 8.6 mIU/mL  4.9   Component     Latest Ref Rng & Units 11/09/2018 12/12/2018  Testosterone, Serum (Total)     ng/dL 494 670  % Free Testosterone     % 0.5 0.5  Free Testosterone, S     pg/mL 25 (L) 34 (L)  Sex Hormone Binding Globulin     nmol/L 111.0 (H) 136.5 (H)  Estradiol     pg/mL  29  Free Estradiol, Percent     %  1.3  Free Estradiol, Serum     pg/mL  0.38  hCG Quant     0 - 3 mIU/mL  <1  Prolactin     4.0 - 15.2 ng/mL  8.0  LH     1.7 - 8.6 mIU/mL  4.9  FSH     1.5 - 12.4 mIU/mL  3.6  Insulin-Like GF-1     84 - 270 ng/mL   43 (L)  Prostate  Specific Ag, Serum     0.0 - 4.0 ng/mL  0.8  Cortisol     ug/dL  10.7   Pituitary MRI (01/18/2019): Normal pituitary protocol MRI. No evidence for adenoma or other abnormality.  We started him on testosterone 50 mg every 2 weeks in 01/2019.  Reviewed his pertinent history:  He remembered being found to have a low testosterone approximately 10 years ago.  He was tried on testosterone gel and then, but came off afterwards.  He then saw Dr. Forde Dandy who also checked the testosterone and patient was told that this was normal.    His wife got pregnant after fertility tx at [facility]. Pt's sperm count was reportedly normal, but on a lower side. He admits for decreased libido Has no  difficulty obtaining but sometimes has problems maintaining an erection No trauma to testes, testicular irradiation but had surgery for L hydrocele.  He mentions that after the surgery he developed a perineal leak (between scrotum and the rectum). He also has problems initiating urine flow  No h/o of mumps orchitis/h/o autoimmune ds. No h/o cryptorchidism He grew and went through puberty after his peers - college + mild shrinking of testes. No very small testes (<5 ml).  No incomplete/delayed sexual development     No breast discomfort/+ gynecomastia (since he was on dialysis).    No loss of body hair (axillary/pubic)/decreased need for shaving + height loss - 2 inches Lately + abnormal sense of smell  + hot flushes when his blood sugars are low No vision problems No worst HA of his life No FH of hypogonadism/infertility  No personal h/o infertility - has children No FH of hemochromatosis or pituitary tumors No excessive weight gain or loss.  No chronic pain. Not on opiates, but Tramadol bid. He does not take steroids.  No alcohol No anabolic steroids use No herbal medicines + antidepressants: Lexapro No AI ds in his family.  He recently saw urology.  Dr. Kathlen Mody.  He has OSA and was  started on CPAP  ROS: Constitutional: no weight gain/no weight loss, no fatigue, no subjective hyperthermia, no subjective hypothermia Eyes: no blurry vision, no xerophthalmia ENT: no sore throat, + see HPI Cardiovascular: no CP/no SOB/no palpitations/no leg swelling Respiratory: no cough/no SOB/no wheezing Gastrointestinal: no N/no V/no D/no C/no acid reflux Musculoskeletal: no muscle aches/no joint aches Skin: no rashes, + hair loss Neurological: + Tremors (chronic, from program)/+ numbness/+ tingling/no dizziness  I reviewed pt's medications, allergies, PMH, social hx, family hx, and changes were documented in the history of present illness. Otherwise, unchanged from my initial visit note.  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 Libre 2 freestyle device. 1 kit 0   Continuous Blood Gluc Sensor (FREESTYLE LIBRE 2 SENSOR SYSTM) MISC 1 each by Does not apply route every 14 (fourteen) days. 6 each 2   escitalopram (LEXAPRO) 10 MG tablet Take 10 mg by mouth 2 (two) times daily.     Glucagon (BAQSIMI  TWO PACK) 3 MG/DOSE POWD Place 3 mg into the nose once as needed for up to 1 dose. 1 each 11   insulin aspart (NOVOLOG) 100 UNIT/ML injection Use up to 100 units per day in insulin pump 3 vial 11   Insulin Disposable Pump (OMNIPOD DASH 5 PACK PODS) MISC 6 Packages by Does not apply route every 3 (three) days. 30 each 3   levothyroxine (SYNTHROID, LEVOTHROID) 75 MCG tablet Take 1 tablet (75 mcg total) by mouth daily. 90 tablet 3   Multiple Vitamins-Minerals (MULTIVITAMINS THER. W/MINERALS) TABS Take 1 tablet by mouth daily.     ondansetron (ZOFRAN) 4 MG tablet Take 1 tablet (4 mg total) by mouth every 8 (eight) hours as needed for nausea or vomiting. 15 tablet 0   ondansetron (ZOFRAN) 4 MG tablet Take 1 tablet (4 mg total) by mouth every 8 (eight) hours as needed for nausea or vomiting. 20 tablet 0   SYRINGE/NEEDLE, DISP, 1 ML 25G X 5/8" 1 ML MISC Use 1 every 2 weeks for testosterone inj's 50 each 1   tacrolimus (PROGRAF) 1 MG capsule Take 3 mg by mouth 2 (two) times daily.     testosterone cypionate (DEPOTESTOSTERONE CYPIONATE) 200 MG/ML injection Inject 0.25 mLs (50 mg total) into the muscle every 14 (fourteen) days. 2 mL 3   traMADol (ULTRAM) 50 MG tablet Take 50-100 mg by mouth every 6 (six) hours as needed for pain.     TRESIBA FLEXTOUCH 100 UNIT/ML SOPN FlexTouch Pen Inject 10 Units into the skin daily.   6   Vitamin D, Ergocalciferol, (DRISDOL) 1.25 MG (50000 UT) CAPS capsule Take 50,000 Units by mouth every Saturday.      No current facility-administered medications on file prior to visit.     Allergies  Allergen Reactions   Codeine Nausea And Vomiting   Metoclopramide Hcl Nausea And Vomiting   Family History  Problem Relation Age of Onset   Stroke Mother    Lymphoma Father    Diabetes type II Father    PE: There were no vitals taken for this visit. Wt Readings from Last 3 Encounters:  12/28/18 217 lb (98.4 kg)  05/02/18 207 lb (93.9 kg)  03/21/18 207 lb (93.9  kg)   Constitutional: overweight, in NAD Eyes: PERRLA, EOMI, no exophthalmos ENT: moist mucous membranes, no thyromegaly, no cervical lymphadenopathy Cardiovascular: RRR, No MRG Respiratory: CTA B Gastrointestinal: abdomen soft, NT, ND, BS+ Musculoskeletal: no deformities, strength intact in all 4 Skin: moist, warm, no rashes Neurological: no tremor with outstretched hands, DTR normal in all 4  ASSESSMENT: 1. DM1, uncontrolled, without complications - h/o DKA - mild gastroparesis - proliferative retinopathy - ESRD -history of being on HD and peritoneal dialysis for 5 years - PN  2. Hashimoto's thyroiditis  3. Low free testosterone  4. Low IGF1  PLAN:  1. Patient with longstanding, uncontrolled, type 1 diabetes, now back on his Omni pod DASH insulin pump.  At last visit, he was  not doing too well entering his sugars into the pump, entering carbs, and bolusing with meals.  Therefore, his sugars were higher and his HbA1c was worse.  At last visit, he was having significant high blood sugars at night and we increase his basal rates and we also strengthened his insulin to carb ratios which were much higher than he needed.  We did not change his sensitivity factor and target but we discussed about the importance of entering carbs and sugars before every meal and bolusing 15 minutes before the meal. -At this visit, sugars are still variable, but slightly better.  Unfortunately, there is no pattern in his blood sugars per review of his CGM downloads so it is difficult to adjust the settings for now.  He tells me that he is doing better with introducing the carbs in his pump, however, he is not bolusing before meals.  He is bolusing after he eats.  Therefore, he is pacing the bolus on the sugars after meals, with subsequent abrupt decreases in glycemia after bolusing.  We again discussed about the importance of doing the boluses 15 minutes before every meal to allow for smoother CBG profile.  He  understands and he is determined to start doing this.  He has good command of carbohydrate counting and I do not feel we need to refer him back to nutrition.  We will continue the same insulin pump settings for now. -I suggested to: Patient Instructions  Please  continue: - basal rates: 12 am: 0.8 5 am: 0.9 3 pm: 0.9 6 pm: 1.0 - ICR  12 am: 1:7  11 am: 1:7 - target: 110-120 - ISF: 50 - active insulin time: 4h  But bolus 15 min before the meal.  Please continue testosterone 50 mg every 2 weeks.  Please come back for labs mid-way between testsoterone injections  Please return in 3 months.  - we checked his HbA1c: 8.7% (lower) - advised to check sugars at different times of the day - 4x a day, rotating check times - advised for yearly eye exams >> he is due-has an appointment later this month - return to clinic in 3 months    2. Hashimoto's thyroiditis - latest thyroid labs reviewed with pt >> normal at last visit: Lab Results  Component Value Date   TSH 2.62 12/28/2018   - he continues on LT4 75 mcg daily - pt feels good on this dose. - we discussed about taking the thyroid hormone every day, with water, >30 minutes before breakfast, separated by >4 hours from acid reflux medications, calcium, iron, multivitamins. Pt. is taking it correctly.  3.  Hypogonadotropic hypogonadism -This was recently found during investigation for low libido.  His total testosterone was normal while SHBG was high and free testosterone was low.  Further investigation pointed towards mild hypogonadotropic hypogonadism.  We checked a pituitary MRI and this was negative for tumor.  As he reported previous testicular surgery and a possible leak in the perineum, and since he also had problems initiating urine stream and therefore possible BPH, I referred him to urology.  He had a benign evaluation and he was cleared for testosterone treatment.  A PSA and a hematocrit was normal before starting  testosterone. -We started testosterone in 01/2019: 50 mg every 14 days -He feels better after he started testosterone, with improved libido and energy.  No side effects. -At this visit, we will check a testosterone level, PSA, CBC  -I encouraged him to have annual DRE's  with PCP or urologist  4. Low IGF1 -Patient had a low IGF-I x2 during pituitary work-up. -No, there is no pituitary tumor on his recent MRI. -We discussed that this may happen in the setting of low testosterone level is usually corrected after testosterone supplementation.  He is not following the restrictive diet, does not have chronic kidney or liver disease (although he does have history of ESRD with now functioning kidney transplant) -We will repeat his IGF-I when he returns for the rest of the tests  Orders Placed This Encounter  Procedures   Testosterone Free with SHBG   PSA   CBC   Lipid panel   xtpit - IGF1   POCT HgB A1C   Time spent with the patient: 40 minutes, of which more than 50% was spent in reviewing his pump and CGM downloads, discussing his hypo and hyperglycemic episodes, reviewing previous labs and pump settings and developing a plan to avoid hypo and hyperglycemia.  We also addressed his hypogonadism and low IGF levels and also his Hashimoto's thyroiditis.  Component     Latest Ref Rng & Units 04/08/2019  Testosterone, Serum (Total)     ng/dL 531  % Free Testosterone     % 0.4  Free Testosterone, S     pg/mL 21 (L)  Sex Hormone Binding Globulin     nmol/L 110.0 (H)  TIBC     250 - 450 ug/dL 251  UIBC     111 - 343 ug/dL 173  Iron     38 - 169 ug/dL 78  Iron Saturation     15 - 55 % 31  LH     1.7 - 8.6 mIU/mL 3.1  His free testosterone is still low, will increase his testosterone to either 100 mg every 2 weeks or 50 mg every week.  We will then need another recheck at next visit.  Philemon Kingdom, MD PhD [facility] Endocrinology

## 2019-04-05 ENCOUNTER — Other Ambulatory Visit: Payer: Self-pay | Admitting: Internal Medicine

## 2019-04-08 ENCOUNTER — Other Ambulatory Visit: Payer: Self-pay | Admitting: Internal Medicine

## 2019-04-11 ENCOUNTER — Encounter: Payer: Self-pay | Admitting: Internal Medicine

## 2019-04-15 LAB — TESTOSTERONE, FREE AND TOTAL (INCLUDES SHBG)-(MALES)
% Free Testosterone: 0.4 %
Free Testosterone, S: 21 pg/mL — ABNORMAL LOW
Sex Hormone Binding Globulin: 110 nmol/L — ABNORMAL HIGH
Testosterone, Serum (Total): 531 ng/dL

## 2019-04-15 LAB — IRON AND TIBC
Iron Saturation: 31 % (ref 15–55)
Iron: 78 ug/dL (ref 38–169)
Total Iron Binding Capacity: 251 ug/dL (ref 250–450)
UIBC: 173 ug/dL (ref 111–343)

## 2019-04-15 LAB — LUTEINIZING HORMONE: LH: 3.1 m[IU]/mL (ref 1.7–8.6)

## 2019-04-15 MED ORDER — TESTOSTERONE CYPIONATE 200 MG/ML IM SOLN: 100.0000 mg | INTRAMUSCULAR | 3 refills | Status: DC

## 2019-04-15 NOTE — Addendum Note (Signed)
Addended by: Philemon Kingdom on: 04/15/2019 11:53 AM   Modules accepted: Orders

## 2019-04-18 ENCOUNTER — Encounter: Payer: Self-pay | Admitting: Internal Medicine

## 2019-04-22 MED ORDER — TESTOSTERONE CYPIONATE 200 MG/ML IM SOLN: 100.0000 mg | INTRAMUSCULAR | 3 refills | Status: DC

## 2019-04-25 ENCOUNTER — Encounter: Payer: Self-pay | Admitting: Internal Medicine

## 2019-07-05 ENCOUNTER — Other Ambulatory Visit: Payer: Self-pay

## 2019-07-05 ENCOUNTER — Ambulatory Visit (INDEPENDENT_AMBULATORY_CARE_PROVIDER_SITE_OTHER): Payer: BC Managed Care – PPO | Admitting: Internal Medicine

## 2019-07-05 ENCOUNTER — Encounter: Payer: Self-pay | Admitting: Internal Medicine

## 2019-07-05 DIAGNOSIS — E1065 Type 1 diabetes mellitus with hyperglycemia: Secondary | ICD-10-CM

## 2019-07-05 DIAGNOSIS — R7989 Other specified abnormal findings of blood chemistry: Secondary | ICD-10-CM

## 2019-07-05 DIAGNOSIS — E038 Other specified hypothyroidism: Secondary | ICD-10-CM

## 2019-07-05 DIAGNOSIS — E10319 Type 1 diabetes mellitus with unspecified diabetic retinopathy without macular edema: Secondary | ICD-10-CM

## 2019-07-05 DIAGNOSIS — E230 Hypopituitarism: Secondary | ICD-10-CM

## 2019-07-05 DIAGNOSIS — IMO0002 Reserved for concepts with insufficient information to code with codable children: Secondary | ICD-10-CM

## 2019-07-05 DIAGNOSIS — E063 Autoimmune thyroiditis: Secondary | ICD-10-CM

## 2019-07-05 MED ORDER — TESTOSTERONE CYPIONATE 200 MG/ML IM SOLN: 100.0000 mg | INTRAMUSCULAR | 3 refills | Status: DC

## 2019-07-05 MED ORDER — "SYRINGE/NEEDLE (DISP) 23G X 1"" 1 ML MISC": 1 refills | Status: DC

## 2019-07-05 NOTE — Patient Instructions (Addendum)
Please  continue: - basal rates: 12 am: 0.8 5 am: 0.9 3 pm: 0.9 6 pm: 1.0 - ICR  12 am: 1:7  11 am: 1:7 - target: 110-120 - ISF: 50 - active insulin time: 4h  Please bolus 15 minutes before every meal.  You can take  testosterone 0.25 ml weekly.  Please come back for labs in a.m., fasting, midway between injections.  Please return in 3-4 months.

## 2019-07-05 NOTE — Progress Notes (Signed)
Patient ID: Daniel Hill, male   DOB: 03-05-1976, 44 y.o.   MRN: [id number]   [name] Persons participating in the virtual visit: patient, provider  Referring Provider: Raina Mina., MD  I connected with the patient on 07/05/19 at 10:30 AM EST by a video enabled telemedicine application and verified that I am speaking with the correct person.   I discussed the limitations of evaluation and management by telemedicine and the availability of in person appointments. The patient expressed understanding and agreed to proceed.   Details of the encounter are shown below.  HPI: Daniel Hill is a 44 y.o.-year-old male, initially referred by his nephrologist, Dr. Jimmy Footman, presenting for follow-up for DM1, uncontrolled, with complications (DKA, mild gastroparesis, proliferative retinopathy, ESRD -history of being on HD and peritoneal dialysis for 5 years, PN). Last visit 3 months ago.  DM1: Reviewed history: Patient has been diagnosed with diabetes at 44 y/o. She was prev. Seen by [facility] endocrinology.  She is status post kidney-pancreatic transplant in 2003, but his pancreas transplant failed in 2013.  He is considered too high risk for a new pancreatic transplant.  His retinopathy and gastroparesis improved after transplant.  He had several eye surgeries (vitrectomy and scleral buckle procedures).  He has chronic blindness in his left eye.  Reviewed HbA1c levels Lab Results  Component Value Date   HGBA1C 8.7 (A) 04/03/2019   HGBA1C 9.7 (A) 12/28/2018   HGBA1C 8.9 (A) 03/21/2018  07/15/2016: HbA1c 7.8%  He was on the previous regimen: - Tresiba 10 units at bedtime - Humalog - ICR 1:15, target 120, ISF 40 - injects after meals (ends up with 2-6 units per meal)  He is on: He has an Omni pod pump.  He has a freestyle libre 2 CGM. He uses NovoLog in the pump -  Injected after meals.  Pump settings:  - basal rates: 12 am: 0.8 5 am: 0.9 3 pm: 0.9 6  pm: 1.0 - ICR  12 am: 1:7  11 am: 1:7 - target: 110-120 - ISF: 50 - active insulin time: 4h TDD from basal insulin: ~11 >> 17-19 units/day >> ? TDD from bolus insulin: ~21 units >> 4-14 units/day >> ?  He checks his sugars 4 times a day with his freestyle libre CGM >> day ave: 223: - am: 220 - 2h after b'fast: ? - lunch: 110-150 - 2h after lunch: ? - dinner: 150-250 - 2h after dinner: 180-300 - bedtime: ?  Prev. Freestyle libre CGM parameters: - Average: 95% - Glucose management indicator for the last 2 weeks: 8.3% - % active CGM time: 95% of the time - Glucose variability 40.8% (target < or = to 36%) - time in range:  - very low (<54): 0% - low (54-69): 3% - normal range (70-180): 36% - high sugars (181-250): 33% - very high sugars (>250): 28%  Previously:   Lowest sugar was 50 >> 50s; he has hypoglycemia awareness in the 60s.  No previous hypoglycemia admission.  He has an unexpired glucagon kit at home. Highest sugar was >400 >> 300s.  He has a distant history of DKA admission-before his renal transplant.  Pt's meals are: - Breakfast: eggs or oatmeal - Lunch: meat and veggies, sandwich, soup - Dinner: mostly meat and veggies, sometimes pasta and bread - Snacks: 2  - usually apples or beef jerky.  -He has a history of ESRD, now status post renal transplant- Dr. Jimmy Footman, last BUN/creatinine:  Lab Results  Component Value Date   BUN 13 07/28/2018   BUN 16 05/02/2018   CREATININE 0.95 07/28/2018   CREATININE 0.97 05/02/2018  02/2018: Cr 0.8  -+ HL; last set of lipids: Lab Results  Component Value Date   CHOL 150 05/02/2018   HDL 37.40 (L) 05/02/2018   LDLCALC 96 05/02/2018   TRIG 83.0 05/02/2018   CHOLHDL 4 05/02/2018  He takes fish oil.  - last eye exam was in 2020: + DR. [facility] is [facility].  -+ Numbness and tingling in his feet.  He was previously on Reglan and he has intolerance to this.  Pt has FH of DM in father.  Hashimoto's  thyroiditis:  Pt is on levothyroxine 75 mcg daily, taken: - in am - fasting - at least 30 min from b'fast - no Ca, Fe, MVI, PPIs - not on Biotin  Latest TSH was reviewed and this was normal Lab Results  Component Value Date   TSH 2.62 12/28/2018   Pt denies: - feeling nodules in neck - hoarseness - dysphagia - choking - SOB with lying down  He has a family history of thyroid cancer.  He is on a vitamin D supplement.  Hypogonadotropic hypogonadism:  We reviewed pertinent labs together: He had a normal total testosterone with a low free testosterone.  No signs of hemochromatosis.  His HIV test was nonreactive.  He had a normal PSA, normal ECG and prolactin, normal cortisol.  He had a low IGF-I, also. Component     Latest Ref Rng & Units 04/08/2019  Testosterone, Serum (Total)     ng/dL 531  % Free Testosterone     % 0.4  Free Testosterone, S     pg/mL 21 (L)  Sex Hormone Binding Globulin     nmol/L 110.0 (H)  TIBC     250 - 450 ug/dL 251  UIBC     111 - 343 ug/dL 173  Iron     38 - 169 ug/dL 78  Iron Saturation     15 - 55 % 31  LH     1.7 - 8.6 mIU/mL 3.1   Component     Latest Ref Rng & Units 12/28/2018 02/01/2019  Testosterone, Serum (Total)     ng/dL  628  % Free Testosterone     %  0.4  Free Testosterone, S     pg/mL  25 (L)  Sex Hormone Binding Globulin     nmol/L  119.1 (H)  Iron     42 - 165 ug/dL 65   Transferrin     212.0 - 360.0 mg/dL 191.0 (L)   Saturation Ratios     20.0 - 50.0 % 24.3   IGF-I, LC/MS     52 - 328 ng/mL 32 (L)   Z-Score (Male)     -2.0 - 2 SD -2.8 (L)   HIV     NON-REACTI NON-REACTIVE   PSA     0.10 - 4.00 ng/mL 0.64   LH     1.7 - 8.6 mIU/mL  4.9   Component     Latest Ref Rng & Units 11/09/2018 12/12/2018  Testosterone, Serum (Total)     ng/dL 494 670  % Free Testosterone     % 0.5 0.5  Free Testosterone, S     pg/mL 25 (L) 34 (L)  Sex Hormone Binding Globulin     nmol/L 111.0 (H) 136.5 (H)  Estradiol      pg/mL  29  Free Estradiol, Percent     %  1.3  Free Estradiol, Serum     pg/mL  0.38  hCG Quant     0 - 3 mIU/mL  <1  Prolactin     4.0 - 15.2 ng/mL  8.0  LH     1.7 - 8.6 mIU/mL  4.9  FSH     1.5 - 12.4 mIU/mL  3.6  Insulin-Like GF-1     84 - 270 ng/mL  43 (L)  Prostate Specific Ag, Serum     0.0 - 4.0 ng/mL  0.8  Cortisol     ug/dL  10.7   Pituitary MRI (01/18/2019): Normal pituitary protocol MRI. No evidence for adenoma or other abnormality.  We started him on testosterone 50 mg every 2 weeks in 01/2019.  However a testosterone level was low in 03/2019 so we increased the dose to 50 mg every week. However, he is taking 100 mg (0.5 mL) every 2 weeks.  Reviewed pertinent history:  He remembered being found to have a low testosterone approximately 10 years ago.  He was tried on testosterone gel and then, but came off afterwards.  He then saw Dr. Forde Dandy who also checked the testosterone and patient was told that this was normal.    His wife got pregnant after fertility tx [facility]. Pt's sperm count was reportedly normal, but on a lower side. He admits for decreased libido Has no  difficulty obtaining but sometimes has problems maintaining an erection No trauma to testes, testicular irradiation but had surgery for L hydrocele.  He mentions that after the surgery he developed a perineal leak (between scrotum and the rectum). He also has problems initiating urine flow  No h/o of mumps orchitis/h/o autoimmune ds. No h/o cryptorchidism He grew and went through puberty after his peers - college + mild shrinking of testes. No very small testes (<5 ml).  No incomplete/delayed sexual development     No breast discomfort/+ gynecomastia (since he was on dialysis).    No loss of body hair (axillary/pubic)/decreased need for shaving + height loss - 2 inches Lately + abnormal sense of smell  + hot flushes when his blood sugars are low No vision problems No worst HA of his life No FH  of hypogonadism/infertility  No personal h/o infertility - has children No FH of hemochromatosis or pituitary tumors No excessive weight gain or loss.  No chronic pain. Not on opiates, but Tramadol bid. He does not take steroids.  No alcohol No anabolic steroids use No herbal medicines + antidepressants: Lexapro No AI ds in his family.  She saw urology.  Dr. Kathlen Mody.  He has OSA and was uses a CPAP.  He had rectal fistula surgery since last OV.  ROS: Constitutional: no weight gain/no weight loss, no fatigue, no subjective hyperthermia, no subjective hypothermia Eyes: no blurry vision, no xerophthalmia ENT: no sore throat, + see HPI Cardiovascular: no CP/no SOB/no palpitations/no leg swelling Respiratory: no cough/no SOB/no wheezing Gastrointestinal: no N/no V/no D/no C/no acid reflux Musculoskeletal: no muscle aches/no joint aches Skin: no rashes, no hair loss Neurological: + tremors (chronic, from Prograft)/+ numbness/+ tingling/no dizziness  I reviewed pt's medications, allergies, PMH, social hx, family hx, and changes were documented in the history of present illness. Otherwise, unchanged from my initial visit note.  Past Medical History:  Diagnosis Date  . Anxiety   . Depression   . Diabetes mellitus   . GERD (gastroesophageal reflux disease)   .  Hypothyroidism   . Kidney replaced by transplant   . Pancreas transplanted ([place])   . Thyroid disease    Past Surgical History:  Procedure Laterality Date  . EYE SURGERY     Social History   Socioeconomic History  . Marital status: Married    Spouse name: Not on file  . children: yes  . Years of education: Not on file  . 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 Smoking status: Never Smoker  . Smokeless tobacco: Never Used  Substance and Sexual Activity  . Alcohol use:  Beer very seldom  . Drug use: No   Current Outpatient Medications on File  Prior to Visit  Medication Sig Dispense Refill  . Continuous Blood Gluc Receiver (FREESTYLE LIBRE 2 READER SYSTM) DEVI 1 each by Does not apply route See admin instructions. Libre 2 freestyle device. 1 kit 0  . Continuous Blood Gluc Sensor (FREESTYLE LIBRE 2 SENSOR SYSTM) MISC 1 each by Does not apply route every 14 (fourteen) days. 6 each 2  . escitalopram (LEXAPRO) 10 MG tablet Take 10 mg by mouth 2 (two) times daily.    . Glucagon (BAQSIMI TWO PACK) 3 MG/DOSE POWD Place 3 mg into the nose once as needed for up to 1 dose. 1 each 11  . insulin aspart (NOVOLOG) 100 UNIT/ML injection Use up to 100 units per day in insulin pump 3 vial 11  . Insulin Disposable Pump (OMNIPOD DASH 5 PACK PODS) MISC 6 Packages by Does not apply route every 3 (three) days. 30 each 3  . levothyroxine (SYNTHROID) 75 MCG tablet TAKE 1 TABLET(75 MCG) BY MOUTH DAILY 90 tablet 3  . Multiple Vitamins-Minerals (MULTIVITAMINS THER. W/MINERALS) TABS Take 1 tablet by mouth daily.    . ondansetron (ZOFRAN) 4 MG tablet Take 1 tablet (4 mg total) by mouth every 8 (eight) hours as needed for nausea or vomiting. 15 tablet 0  . ondansetron (ZOFRAN) 4 MG tablet Take 1 tablet (4 mg total) by mouth every 8 (eight) hours as needed for nausea or vomiting. 20 tablet 0  . SYRINGE/NEEDLE, DISP, 1 ML 25G X 5/8" 1 ML MISC Use 1 every 2 weeks for testosterone inj's 50 each 1  . tacrolimus (PROGRAF) 1 MG capsule Take 3 mg by mouth 2 (two) times daily.    Marland Kitchen testosterone cypionate (DEPOTESTOSTERONE CYPIONATE) 200 MG/ML injection Inject 0.5 mLs (100 mg total) into the muscle every 14 (fourteen) days. 2 mL 3  . traMADol (ULTRAM) 50 MG tablet Take 50-100 mg by mouth every 6 (six) hours as needed for pain.    . Vitamin D, Ergocalciferol, (DRISDOL) 1.25 MG (50000 UT) CAPS capsule Take 50,000 Units by mouth every Saturday.      No current facility-administered medications on file prior to visit.    Allergies  Allergen Reactions  . Codeine Nausea And  Vomiting  . Metoclopramide Hcl Nausea And Vomiting   Family History  Problem Relation Age of Onset  . Stroke Mother   . Lymphoma Father   . Diabetes type II Father    PE: There were no vitals taken for this visit. Wt Readings from Last 3 Encounters:  04/03/19 216 lb (98 kg)  12/28/18 217 lb (98.4 kg)  05/02/18 207 lb (93.9 kg)   Constitutional:  in NAD  The physical exam was not performed (virtual visit).  ASSESSMENT: 1. DM1, uncontrolled, without complications - h/o DKA - mild  gastroparesis - proliferative retinopathy - ESRD -history of being on HD and peritoneal dialysis for 5 years - PN  2. Hashimoto's thyroiditis  3.  Hypogonadotropic  4. Low IGF1  PLAN:  1. Patient with longstanding, uncontrolled, type 1 diabetes, back on his Omni pod DASHinsulin pump.  Unfortunately, this appointment was virtual, so we could not download his CGM or his pump.  He did not make any changes in his pump since last visit. -At this visit, sugars appear to be approximately the same as before, per his recall and review of the CGM. -He mentions that he is still not bolusing 15 minutes before meals but only after he finishes eating, which we discussed in the past is not conducive to good control.  He is aware that he needs to start moving the boluses before the meals.  I explained today why this is important (it takes 15 minutes for the insulin to go from the subcutaneous tissue to his blood stream, and also, he starts absorbing carbs as his food touches his mouth).  I advised him to start trying to bolus 15 minutes prior to meals with lunch or breakfast so that there is no fear of hypoglycemia in the evening.  He is very busy with his 35-year-old daughter and his days are quite hectic.  However, he is willing to try this and he is aware that without injecting before meals, we would not be able to manage his diabetes.  For now, I do not feel the changes in his pump settings are necessary. -I suggested  to: Patient Instructions  Please  continue: - basal rates: 12 am: 0.8 5 am: 0.9 3 pm: 0.9 6 pm: 1.0 - ICR  12 am: 1:7  11 am: 1:7 - target: 110-120 - ISF: 50 - active insulin time: 4h  Please bolus 15 minutes before every meal.  You can take  Testosterone 0.25 ml weekly.  Please come back for labs in a.m., fasting, midway between injections.  Please return in 3-4 months.  - we will check his HbA1c with the next lab draw - advised to check sugars at different times of the day - 4x a day, rotating check times - advised for yearly eye exams >> he is UTD - return to clinic in 3-4 months   2. Hashimoto's thyroiditis - latest thyroid labs reviewed with pt >> normal: Lab Results  Component Value Date   TSH 2.62 12/28/2018   - he continues on LT4 75 mcg daily - pt feels good on this dose. - we discussed about taking the thyroid hormone every day, with water, >30 minutes before breakfast, separated by >4 hours from acid reflux medications, calcium, iron, multivitamins. Pt. is taking it correctly. - will check thyroid tests at next lab draw: TSH and fT4 - If labs are abnormal, he will need to return for repeat TFTs in 1.5 months  3.  Hypogonadotropic hypogonadism -This was recently found during investigation for low libido.  His total testosterone was normal while SHBG was high and free testosterone was low.  Further investigation pointed towards mild hypogonadotropic hypogonadism.  We checked a pituitary MRI and this was negative for tumor.  As he reported previous testicular surgery and a possible leak in the perineum, and since he also had problems initiating urine stream and therefore possible BPH, I referred him to urology.  He had a benign evaluation and he was cleared for testosterone treatment.  A PSA hematocrit was normal before starting testosterone. -We started  testosterone in 01/2019: 50 mg every 14 days.  However, testosterone level was low so I advised him to increase the  dose to 50 mg every 7 days.  He is currently taking 100 mg (0.5 mL of 200 mg/mL) every 2 weeks.  He is interested in switching to weekly dosing and I advised him to take 0.25 mL every week. -He feels better after he started testosterone, with improved libido and energy.  No side effects -I advised him to return for a testosterone level halfway between injections.  -I encouraged him to have annual DRE's with PCP or urologist  4. Low IGF1 -He had a low IGF 1x2 during pituitary work-up -No pituitary tumor on his recent MRI-records reviewed -Most likely his low IGF-I was related to his hypogonadism.  This is usually corrected after testosterone supplementation.  He is not following a restrictive diet, does not have chronic kidney or liver disease (although he does have a history of ESRD with a functioning kidney transplant in place). -I would like to repeat his IGF-I at next lab draw  Orders Placed This Encounter  Procedures  . Testosterone Free with SHBG  . Insulin-like growth factor  . TSH  . T4, free  . Hemoglobin A1c   - time spent for this visit: 40 minutes, of which >50% was spent in obtaining information about his diabetes including blood sugar, insulin doses, education about proper insulin injections (also other topics-see above), reviewing his previous labs, evaluations, and treatments, counseling him about his other conditions and treatments, including testosterone use (please see the discussed topics above), and developing a plan to further investigate and treat them; he had a number of questions which I addressed.   Philemon Kingdom, MD PhD [facility]

## 2019-08-12 ENCOUNTER — Encounter: Payer: Self-pay | Admitting: Internal Medicine

## 2019-08-15 ENCOUNTER — Encounter: Payer: Self-pay | Admitting: Internal Medicine

## 2019-08-15 NOTE — Progress Notes (Signed)
Received labs from Dr. Jimmy Footman, performed on 07/31/2019: Glucose 163, BUN/creatinine 15/1.10, GFR 81 Protein to creatinine ratio 86 (0-200) Lipids: 162/94/42/102

## 2019-09-06 ENCOUNTER — Other Ambulatory Visit: Payer: Self-pay | Admitting: Internal Medicine

## 2019-09-06 ENCOUNTER — Telehealth: Payer: Self-pay | Admitting: Internal Medicine

## 2019-09-06 NOTE — Telephone Encounter (Signed)
Medication Refill Request  Did you call your pharmacy and request this refill first? Yes  . If patient has not contacted pharmacy first, instruct them to do so for future refills.  . Remind them that contacting the pharmacy for their refill is the quickest method to get the refill.  . Refill policy also stated that it will take anywhere between 24-72 hours to receive the refill.    Name of medication? Insulin Disposable Pump (OMNIPOD DASH 5 PACK PODS)   Is this a 90 day supply? yes  Name and location of pharmacy?  [facility] [phone] - [address], [place] Phone:  [phone]  Fax:  [phone]

## 2019-09-06 NOTE — Telephone Encounter (Signed)
This was already done since he had contact his pharmacy and thy sent the request.

## 2019-09-15 ENCOUNTER — Other Ambulatory Visit: Payer: Self-pay | Admitting: Internal Medicine

## 2019-09-26 ENCOUNTER — Other Ambulatory Visit: Payer: Self-pay | Admitting: Internal Medicine

## 2019-09-30 ENCOUNTER — Encounter: Payer: Self-pay | Admitting: Internal Medicine

## 2019-10-10 ENCOUNTER — Other Ambulatory Visit: Payer: Self-pay | Admitting: Internal Medicine

## 2019-11-04 ENCOUNTER — Other Ambulatory Visit: Payer: Self-pay | Admitting: Internal Medicine

## 2020-01-09 ENCOUNTER — Encounter: Payer: Self-pay | Admitting: Internal Medicine

## 2020-01-19 ENCOUNTER — Other Ambulatory Visit: Payer: Self-pay | Admitting: Internal Medicine

## 2020-01-20 NOTE — Telephone Encounter (Signed)
Please sign if ok to refill

## 2020-01-20 NOTE — Telephone Encounter (Signed)
Rx sent 

## 2020-01-30 ENCOUNTER — Other Ambulatory Visit: Payer: Self-pay | Admitting: Internal Medicine

## 2020-01-30 ENCOUNTER — Encounter: Payer: Self-pay | Admitting: Internal Medicine

## 2020-01-30 MED ORDER — DEXCOM G6 TRANSMITTER MISC: 1.0000 | 3 refills | Status: DC

## 2020-01-30 MED ORDER — DEXCOM G6 SENSOR MISC
3 refills | Status: DC
Start: 2020-01-30 — End: 2020-06-01

## 2020-02-18 ENCOUNTER — Encounter: Payer: Self-pay | Admitting: Internal Medicine

## 2020-02-18 DIAGNOSIS — E10319 Type 1 diabetes mellitus with unspecified diabetic retinopathy without macular edema: Secondary | ICD-10-CM

## 2020-02-18 DIAGNOSIS — E038 Other specified hypothyroidism: Secondary | ICD-10-CM

## 2020-02-18 DIAGNOSIS — IMO0002 Reserved for concepts with insufficient information to code with codable children: Secondary | ICD-10-CM

## 2020-02-18 DIAGNOSIS — E230 Hypopituitarism: Secondary | ICD-10-CM

## 2020-03-03 ENCOUNTER — Other Ambulatory Visit: Payer: Self-pay | Admitting: Internal Medicine

## 2020-03-03 ENCOUNTER — Encounter: Payer: Self-pay | Admitting: Internal Medicine

## 2020-03-16 ENCOUNTER — Encounter: Payer: Self-pay | Admitting: Internal Medicine

## 2020-03-16 ENCOUNTER — Ambulatory Visit: Payer: BC Managed Care – PPO | Admitting: Internal Medicine

## 2020-03-16 ENCOUNTER — Other Ambulatory Visit: Payer: Self-pay

## 2020-03-16 VITALS — BP 130/82 | HR 82 | Ht 69.0 in | Wt 214.6 lb

## 2020-03-16 DIAGNOSIS — E10319 Type 1 diabetes mellitus with unspecified diabetic retinopathy without macular edema: Secondary | ICD-10-CM | POA: Diagnosis not present

## 2020-03-16 DIAGNOSIS — E038 Other specified hypothyroidism: Secondary | ICD-10-CM

## 2020-03-16 DIAGNOSIS — E1065 Type 1 diabetes mellitus with hyperglycemia: Secondary | ICD-10-CM

## 2020-03-16 DIAGNOSIS — E230 Hypopituitarism: Secondary | ICD-10-CM

## 2020-03-16 DIAGNOSIS — E063 Autoimmune thyroiditis: Secondary | ICD-10-CM

## 2020-03-16 DIAGNOSIS — IMO0002 Reserved for concepts with insufficient information to code with codable children: Secondary | ICD-10-CM

## 2020-03-16 DIAGNOSIS — R7989 Other specified abnormal findings of blood chemistry: Secondary | ICD-10-CM

## 2020-03-16 LAB — POCT GLYCOSYLATED HEMOGLOBIN (HGB A1C): Hemoglobin A1C: 9.2 % — AB (ref 4.0–5.6)

## 2020-03-16 MED ORDER — BAQSIMI TWO PACK 3 MG/DOSE NA POWD: 3.0000 mg | Freq: Once | NASAL | 11 refills | Status: AC | PRN

## 2020-03-16 MED ORDER — FIASP 100 UNIT/ML ~~LOC~~ SOLN: SUBCUTANEOUS | 3 refills | Status: DC

## 2020-03-16 NOTE — Progress Notes (Signed)
Patient ID: Daniel Hill, male   DOB: 12/14/1975, 44 y.o.   MRN: [id number]   This visit occurred during the SARS-CoV-2 public health emergency.  Safety protocols were in place, including screening questions prior to the visit, additional usage of staff PPE, and extensive cleaning of exam room while observing appropriate contact time as indicated for disinfecting solutions.   HPI: Daniel Hill is a 44 y.o.-year-old male, initially referred by his nephrologist, Dr. Jimmy Footman, presenting for follow-up for DM1, uncontrolled, with complications (DKA, mild gastroparesis, proliferative retinopathy, ESRD -history of being on HD and peritoneal dialysis for 5 years, PN). Last visit 8 months ago.  DM1: Reviewed history Patient has been diagnosed with diabetes at 44 y/o. She was prev. Seen by [facility] endocrinology.  She is status post kidney-pancreatic transplant in 2003, but his pancreas transplant failed in 2013.  He is considered too high risk for a new pancreatic transplant.  His retinopathy and gastroparesis improved after transplant.  He had several eye surgeries (vitrectomy and scleral buckle procedures).  He has chronic blindness in his left eye.  Reviewed HbA1c levels: 01/01/2020: HbA1c 9.1% Lab Results  Component Value Date   HGBA1C 8.7 (A) 04/03/2019   HGBA1C 9.7 (A) 12/28/2018   HGBA1C 8.9 (A) 03/21/2018  07/15/2016: HbA1c 7.8%  He was on the previous regimen: - Tresiba 10 units at bedtime - Humalog - ICR 1:15, target 120, ISF 40 - injects after meals (ends up with 2-6 units per meal)  Now on an insulin pump:  -Omni pod DASH  Pump  CGM: -On freestyle libre 2, but had problems with the sensor coming off -could not switch to Dexcom CGM (not covered)  Insulin: - NovoLog in the pump -  Still Injected after meals.  Pump settings:  - basal rates: 12 am: 0.7 5 am: 0.8 3 pm: 0.8 6 pm: 1.0 - ICR  12 am: 1: 7  11 am: 1: 7 - target: 110-120 - ISF: 50 - active insulin time: 4h TDD  from basal insulin: ~11 >> 17-19 units/day >> ? TDD from bolus insulin: ~21 units >> 4-14 units/day >> ?  Currently on The Freestyle Libre 2 CGM-checks more than 4 times a day. - Average: 204 - Glucose management indicator for the last 2 weeks: 8.3% >> 8.2% - % active CGM time: 95% >> 94% of the time - Glucose variability 40.8% >> 41.3% (target < or = to 36%) - time in range:  - very low (<54): 0% >> 0% - low (54-69): 3% >> 3%  - normal range (70-180): 36% >> 41% - high sugars (181-250): 33% >> 26% - very high sugars (>250): 28% >> 30%    Previously:   Lowest sugar was 50 >> 50s >> 50s; he has hypoglycemia awareness in the 60s.  No previous hypoglycemia admission.  He has an unexpired glucagon kit at home. Highest sugar was >400 >> 300s >> 400s.  He has a distant history of DKA admission-before his renal transplant.  Pt's meals are: - Breakfast: eggs or oatmeal - Lunch: meat and veggies, sandwich, soup - Dinner: mostly meat and veggies, sometimes pasta and bread - Snacks: 2  - usually apples or beef jerky.  -He has a history of ESRD, now status post renal transplant- Dr. Jimmy Footman >> Dr Posey Pronto, last BUN/creatinine:  01/01/2020: BUN/creatinine 70/1.13, glucose 136 10/27/2019: Glucose 163, BUN/creatinine 15/1.10, GFR 81 Lab Results  Component Value Date   BUN 13 07/28/2018   BUN 16 05/02/2018   CREATININE  0.95 07/28/2018   CREATININE 0.97 05/02/2018  02/2018: Cr 0.8  -+ HL; last set of lipids: 07/31/2019: 162/94/42/102 Lab Results  Component Value Date   CHOL 150 05/02/2018   HDL 37.40 (L) 05/02/2018   LDLCALC 96 05/02/2018   TRIG 83.0 05/02/2018   CHOLHDL 4 05/02/2018  He takes fish oil.  - last eye exam was in 2020: + DR; he is seen at [facility].  -He does have numbness and tingling in his feet.  Latest B12 level: 01/01/2020: 381  He was previously on Reglan and he has intolerance to this.  Pt has FH of DM in father.  Hashimoto's thyroiditis:  Pt is on  levothyroxine 75 mcg daily, taken: - in am - fasting - at least 30 min from b'fast - no calcium - no iron - no multivitamins - no PPIs - not on Biotin  Latest TSH was normal: 01/01/2020: 2.502 Lab Results  Component Value Date   TSH 2.62 12/28/2018   Pt denies: - feeling nodules in neck - hoarseness - dysphagia - choking - SOB with lying down  He does have a family history of thyroid cancer.  Hypogonadotropic hypogonadism:  Reviewed pertinent labs:  He had a normal total testosterone with a low free testosterone.  No signs of hemochromatosis.  His HIV test was nonreactive.  He had a normal PSA, normal ECG and prolactin, normal cortisol.  He had a low IGF-I, also.  Latest testosterone level was reviewed: 01/01/2020: Free testosterone 3.22 (4.26-16.1), Hb/HT 14.4/43.4 at last check by PCP  Previously: Component     Latest Ref Rng & Units 04/08/2019  Testosterone, Serum (Total)     ng/dL 531  % Free Testosterone     % 0.4  Free Testosterone, S     pg/mL 21 (L)  Sex Hormone Binding Globulin     nmol/L 110.0 (H)  TIBC     250 - 450 ug/dL 251  UIBC     111 - 343 ug/dL 173  Iron     38 - 169 ug/dL 78  Iron Saturation     15 - 55 % 31  LH     1.7 - 8.6 mIU/mL 3.1   Component     Latest Ref Rng & Units 12/28/2018 02/01/2019  Testosterone, Serum (Total)     ng/dL  628  % Free Testosterone     %  0.4  Free Testosterone, S     pg/mL  25 (L)  Sex Hormone Binding Globulin     nmol/L  119.1 (H)  Iron     42 - 165 ug/dL 65   Transferrin     212.0 - 360.0 mg/dL 191.0 (L)   Saturation Ratios     20.0 - 50.0 % 24.3   IGF-I, LC/MS     52 - 328 ng/mL 32 (L)   Z-Score (Male)     -2.0 - 2 SD -2.8 (L)   HIV     NON-REACTI NON-REACTIVE   PSA     0.10 - 4.00 ng/mL 0.64   LH     1.7 - 8.6 mIU/mL  4.9   Component     Latest Ref Rng & Units 11/09/2018 12/12/2018  Testosterone, Serum (Total)     ng/dL 494 670  % Free Testosterone     % 0.5 0.5  Free Testosterone, S      pg/mL 25 (L) 34 (L)  Sex Hormone Binding Globulin     nmol/L 111.0 (H) 136.5 (  H)  Estradiol     pg/mL  29  Free Estradiol, Percent     %  1.3  Free Estradiol, Serum     pg/mL  0.38  hCG Quant     0 - 3 mIU/mL  <1  Prolactin     4.0 - 15.2 ng/mL  8.0  LH     1.7 - 8.6 mIU/mL  4.9  FSH     1.5 - 12.4 mIU/mL  3.6  Insulin-Like GF-1     84 - 270 ng/mL  43 (L)  Prostate Specific Ag, Serum     0.0 - 4.0 ng/mL  0.8  Cortisol     ug/dL  10.7   Lab Results  Component Value Date   PSA 0.64 12/28/2018   Pituitary MRI (01/18/2019): Normal pituitary MRI.  No evidence of adenoma or other abnormality.  We started him on testosterone: 01/2019: 50 mg every 2 weeks   03/2019: Testosterone level was low >> I advised him to increase the dose to 50 mg every week. However, he is was taking 100 mg (0.5 mL) every 2 weeks. 06/2019: I recommended testosterone to 50 mg (0.25 mL) every week >> however, he is still taking 0.5 ml every 2 weeks  Reviewed pertinent history:  He remembered being found to have a low testosterone approximately 10 years ago.  He was tried on testosterone gel and then, but came off afterwards.  He then saw Dr. Forde Dandy who also checked the testosterone and patient was told that this was normal.    His wife got pregnant after fertility tx [facility]. Pt's sperm count was reportedly normal, but on a lower side. He admits for decreased libido Has no  difficulty obtaining but sometimes has problems maintaining an erection No trauma to testes, testicular irradiation but had surgery for L hydrocele.  He mentions that after the surgery he developed a perineal leak (between scrotum and the rectum). He also has problems initiating urine flow  No h/o of mumps orchitis/h/o autoimmune ds. No h/o cryptorchidism He grew and went through puberty after his peers - college + mild shrinking of testes. No very small testes (<5 ml).  No incomplete/delayed sexual development     No breast  discomfort/+ gynecomastia (since he was on dialysis).    No loss of body hair (axillary/pubic)/decreased need for shaving + height loss - 2 inches Lately + abnormal sense of smell  + hot flushes when his blood sugars are low No vision problems No worst HA of his life No FH of hypogonadism/infertility  No personal h/o infertility - has children No FH of hemochromatosis or pituitary tumors No excessive weight gain or loss.  No chronic pain. Not on opiates, but Tramadol bid. He does not take steroids.  No alcohol No anabolic steroids use No herbal medicines + antidepressants: Lexapro No AI ds in his family.  She saw urology.  Dr. Kathlen Mody. She has OSA-on CPAP He had a rectal fistula surgery at the beginning of 2021. He takes a vitamin D supplement.  ROS: Constitutional: no weight gain/no weight loss, no fatigue, no subjective hyperthermia, no subjective hypothermia Eyes: no blurry vision, no xerophthalmia ENT: no sore throat, + see HPI Cardiovascular: no CP/no SOB/no palpitations/no leg swelling Respiratory: no cough/no SOB/no wheezing Gastrointestinal: no N/no V/no D/no C/no acid reflux Musculoskeletal: no muscle aches/no joint aches Skin: no rashes, no hair loss Neurological: + tremors (chronic, from Prograft)/+ numbness/+ tingling/no dizziness  I reviewed pt's medications, allergies, PMH,  social hx, family hx, and changes were documented in the history of present illness. Otherwise, unchanged from my initial visit note.  Past Medical History:  Diagnosis Date  . Anxiety   . Depression   . Diabetes mellitus   . GERD (gastroesophageal reflux disease)   . Hypothyroidism   . Kidney replaced by transplant   . Pancreas transplanted ([place])   . Thyroid disease    Past Surgical History:  Procedure Laterality Date  . EYE SURGERY     Social History   Socioeconomic History  . Marital status: Married    Spouse name: Not on file  . children: yes  . Years of education: Not on  file  . 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 Smoking status: Never Smoker  . Smokeless tobacco: Never Used  Substance and Sexual Activity  . Alcohol use:  Beer very seldom  . Drug use: No   Current Outpatient Medications on File Prior to Visit  Medication Sig Dispense Refill  . Continuous Blood Gluc Sensor (DEXCOM G6 SENSOR) MISC APPLY SENSOR AS DIRECTED TO BODY FOR CONTINUOUS GLUCOSE MONITORING. CHANGE SENSOR EVERY 10 DAYS. 9 each 3  . Continuous Blood Gluc Transmit (DEXCOM G6 TRANSMITTER) MISC 1 Device by Does not apply route every 3 (three) months. 1 each 3  . escitalopram (LEXAPRO) 10 MG tablet Take 10 mg by mouth 2 (two) times daily.    . Glucagon (BAQSIMI TWO PACK) 3 MG/DOSE POWD Place 3 mg into the nose once as needed for up to 1 dose. 1 each 11  . insulin aspart (NOVOLOG) 100 UNIT/ML injection USE UP TO 100 UNITS PER DAY AS DIRECTED IN INSULIN PUMP 30 mL 2  . Insulin Disposable Pump (OMNIPOD DASH 5 PACK PODS) MISC APPLY EVERY 3 DAYS AS DIRECTED 30 each 3  . levothyroxine (SYNTHROID) 75 MCG tablet TAKE 1 TABLET(75 MCG) BY MOUTH DAILY 90 tablet 3  . Multiple Vitamins-Minerals (MULTIVITAMINS THER. W/MINERALS) TABS Take 1 tablet by mouth daily.    . ondansetron (ZOFRAN) 4 MG tablet Take 1 tablet (4 mg total) by mouth every 8 (eight) hours as needed for nausea or vomiting. 15 tablet 0  . SYRINGE/NEEDLE, DISP, 1 ML 23G X 1" 1 ML MISC Use every week 50 each 1  . tacrolimus (PROGRAF) 1 MG capsule Take 3 mg by mouth 2 (two) times daily.    Marland Kitchen testosterone cypionate (DEPOTESTOSTERONE CYPIONATE) 200 MG/ML injection NJECT 0.5 ML IN THE MUSCLE EVERY 14 DAYS (SINGLE USE VIALS) 3 mL 3  . traMADol (ULTRAM) 50 MG tablet Take 50-100 mg by mouth every 6 (six) hours as needed for pain.    . Vitamin D, Ergocalciferol, (DRISDOL) 1.25 MG (50000 UT) CAPS capsule Take 50,000 Units by mouth every Saturday.      No current  facility-administered medications on file prior to visit.    Allergies  Allergen Reactions  . Codeine Nausea And Vomiting  . Metoclopramide Hcl Nausea And Vomiting   Family History  Problem Relation Age of Onset  . Stroke Mother   . Lymphoma Father   . Diabetes type II Father    PE: BP 130/82   Pulse 82   Ht _0  (1.753 m)   Wt 214 lb 9.6 oz (97.3 kg)   SpO2 98%   BMI 31.69 kg/m  Wt Readings from Last 3 Encounters:  03/16/20 214 lb 9.6 oz (97.3 kg)  04/03/19 216  lb (98 kg)  12/28/18 217 lb (98.4 kg)   Constitutional: overweight, in NAD Eyes: PERRLA, EOMI, no exophthalmos ENT: moist mucous membranes, no thyromegaly, no cervical lymphadenopathy Cardiovascular: RRR, No MRG Respiratory: CTA B Gastrointestinal: abdomen soft, NT, ND, BS+ Musculoskeletal: no deformities, strength intact in all 4 Skin: moist, warm, no rashes Neurological: + tremor with outstretched hands, DTR normal in all 4  ASSESSMENT: 1. DM1, uncontrolled, without complications - h/o DKA - mild gastroparesis - proliferative retinopathy - ESRD -history of being on HD and peritoneal dialysis for 5 years - PN  2. Hashimoto's thyroiditis  3.  Hypogonadotropic  4. Low IGF1  PLAN:  1. Patient with longstanding, uncontrolled, type 1 diabetes, on an Omni pod DASH insulin pump.  Since last visit, he contacted me to change his CGM from freestyle libre to to [facility] as he had problems with the libre CGM coming off. He did not make the switch as his insurance did not cover Dexcom.  He is wondering whether I could write a letter for the insurance to ask him to cover this, however, we decided to wait until the new Dexcom sensor is out to connect to his OmniPod-pump.  At that time, he will need a preauthorization.  I advised him to attach the sensor on the inside of his arm and she will try this. -At last visit, we could not download his CGM and pump as this was a virtual appointment. He was mentioning that he was  still not bolusing 15 minutes before meals but only after he finished eating and we discussed in the past that ideally, she would bolus 15 minutes before meals.  He does have mild gastroparesis, but I would still recommend to try to bolus before meals and see if his sugars improved.  We did not change his regimen at that time. -He had an HbA1c with PCP on 01/01/2020 and this was higher, 9.1% increase from 8.7% in 03/2019. CGM interpretation: - -At today's visit, we reviewed his CGM downloads: It appears that 41% of values are in target range (a slight increase from 36% at last in person visit), while 56% are higher than 180, and 3% are lower than 70.  The calculated average blood sugar is 204,  very high.  The projected HbA1c for the next 3 months ([facility]MI) is 8.2%. -Reviewing the CGM trends, it appears that his sugars increase significantly overnight.  The largest increases after breakfast, after 5 AM.  Upon questioning, he is still injecting his NovoLog after the meal, which is not conducive to good control and it is causing a lot of variability.  I explained that he needs to inject the insulin before the meal to prevent the abrupt increase in blood sugars after the meal and then the abrupt decrease when the insulin actually reaches his blood.  To help him with this, I suggested to change from NovoLog to [place], which is a shorter onset and can be injected at the start of the meal.  I strongly advised him to move the boluses from post to preprandial.  He agrees to try this. -His sugars improved after the initial increase around 9 AM, after breakfast, but they are still mostly elevated throughout the day with a significant increase after lunch.  I believe that this pattern is also related to bolus insulin timing, rather than the actual amount of insulin injected, but I did advise him that, after he starts taking insulin correctly, if he does not see any improvement in  the postprandial blood sugars, he can strengthen  his insulin to carb ratios from 1: 7 to 1: 6.  He agrees to do this. -It looks like his sugars are initially dropping after dinner and he then needs to correct them, after which his sugars increase as mentioned above in the second half of the night.  We did discuss about adjusting his insulin sensitivity factor to avoid abrupt decreases in his blood sugars, but the best way to prevent this is to avoid the original post dinner peak by taking the insulin correctly. -His significant increase in blood sugar in the morning observed on the CGM download could be related to coffee.  He is adding creamer in his coffee and we discussed that this can definitely raise his blood sugars even though he does end up bolusing for coffee.  He is determined to stop creamer and I advised him to replace this with almond milk. -For now, we can continue the same basal insulin doses.  He did decrease them since last visit that she was having more low blood sugars at night.  Upon questioning and upon review of his CGM tracings, it is more likely that these low blood sugars were due to correction of significant hyperglycemia after dinner. -He had no severe lows since last visit. -I suggested to: Patient Instructions  Please  continue: - basal rates: 12 am: 0.7 5 am: 0.8  3 pm: 0.8 6 pm: 1.0 - ICR  12 am: 1:7 (can change to 1:6 if sugars are still high after meals)  11 am: 1:7  (can change to 1:6 if sugars are still high after meals) - target: 110-120 - ISF: 12 am: 50 8 pm: 50 >> 70 - active insulin time: 4h  Please stop creamer and replace it with a little almond milk.  Please change Novolog to FiAsp >> you can bolus right before meals.  Please continue testosterone 0.5 ml weekly.  Please return in 3-4 months.  - we checked his HbA1c: 9.2% (higher) - advised to check sugars at different times of the day - 4x a day, rotating check times - advised for yearly eye exams >> he is not UTD - return to clinic in 4  months   2. Hashimoto's thyroiditis - latest thyroid labs reviewed with pt >> normal: 2.502 -2 months ago  - he continues on LT4 75 mcg daily - pt feels good on this dose. - He is taking the thyroid hormone every day, with water, >30 minutes before breakfast, separated by >4 hours from acid reflux medications, calcium, iron, multivitamins. Pt. is taking it correctly.  3.  Hypogonadotropic hypogonadism -This was recently found during investigation for low libido.  His total testosterone was normal while SHBG was high and free testosterone was low.  Further investigation pointed towards mild hypogonadotropic hypogonadism.  We checked a pituitary MRI and this was negative for tumor.  As he reported previous testicular surgery and a possible leak in the perineum, and since he also had problems initiating urine stream and therefore possible BPH, I referred him to urology.  He had a benign evaluation and he was cleared for testosterone treatment.  A PSA and hematocrit were normal before starting testosterone. -Restarted testosterone in 01/2019: 50 mg every 14 days.  However, his testosterone level was low so I advised him to increase the dose to 50 mg every 7 days.  However, at last visit, he was still taking 100 mg (0.5 mL of 200 mg/mL) every 2 weeks.  I advised him to take 0.25 mL every week (50 mg weekly).  At this visit, he tells me that he actually continues to take 0.5 mL every 2 weeks as this is working better for him. -He feels better after starting testosterone, with improved libido and energy.  However, he started to have hot flashes recently.  Reviewed the latest CBC from 01/01/2020 and this showed a normal hemoglobin at 14.4 and hematocrit of 43.4. -His measured free testosterone level was slightly low at last check by PCP, is 3.22 (4.26-16.4).  I do not have an SHBG to calculate the free testosterone.  He tells me that he cannot remember exactly when these labs were drawn, if they were halfway  between injections or if they were drawn in the morning. -We will need to re-check his testosterone levels halfway between injections, fasting, between 8 and 9 AM-he will have them checked at [facility] -At last visit, I encouraged him to have annual DRE's with PCP or urologist  4. Low IGF1 -He had a low IGF-I twice during pituitary work-up  -No tumor on his pituitary MRI-per reviewed records -His low IGF-I was most likely related to hypogonadism.  This is usually corrected after testosterone supplementation.  He is not following a restrictive diet, does not have chronic kidney or liver disease (although he does have a history of ESRD with a functioning kidney transplant in place). -We will repeat an IGF-I with the next lab draw  Total time spent for the visit: 40 min, in reviewing his pump downloads, discussing his hypo- and hyper-glycemic episodes, reviewing previous labs and pump settings and developing a plan to avoid hypo- and hyper-glycemia.  We also addressed his other endocrine issues (please see above).   Philemon Kingdom, MD PhD [facility] Endocrinology

## 2020-03-16 NOTE — Patient Instructions (Addendum)
Please  continue: - basal rates: 12 am: 0.7 5 am: 0.8  3 pm: 0.8 6 pm: 1.0 - ICR  12 am: 1:7 (can change to 1:6 if sugars are still high after meals)  11 am: 1:7  (can change to 1:6 if sugars are still high after meals) - target: 110-120 - ISF: 12 am: 50 8 pm: 50 >> 70 - active insulin time: 4h  Please stop creamer and replace it with a little almond milk.  Please change Novolog to FiAsp >> you can bolus right before meals.  Please continue testosterone 0.5 ml weekly.  Please return in 3-4 months.

## 2020-03-17 ENCOUNTER — Encounter: Payer: Self-pay | Admitting: Internal Medicine

## 2020-03-17 ENCOUNTER — Telehealth: Payer: Self-pay | Admitting: Nutrition

## 2020-03-17 NOTE — Telephone Encounter (Signed)
I received an email from [facility] support saying that the patient will need a new Dash system.  They have tried to contact him X2 with no answer.  I called and left a message to call them with the support team member's name and the problem number.

## 2020-03-30 ENCOUNTER — Other Ambulatory Visit: Payer: Self-pay | Admitting: Internal Medicine

## 2020-03-31 MED ORDER — "TUBERCULIN SYRINGE 25G X 5/8"" 1 ML MISC": 12 refills | Status: DC

## 2020-03-31 NOTE — Addendum Note (Signed)
Addended by: Caprice Beaver T on: 03/31/2020 08:50 AM   Modules accepted: Orders

## 2020-04-10 ENCOUNTER — Other Ambulatory Visit: Payer: Self-pay | Admitting: Internal Medicine

## 2020-05-06 ENCOUNTER — Telehealth: Payer: Self-pay

## 2020-05-23 ENCOUNTER — Encounter: Payer: Self-pay | Admitting: Internal Medicine

## 2020-05-23 DIAGNOSIS — IMO0002 Reserved for concepts with insufficient information to code with codable children: Secondary | ICD-10-CM

## 2020-05-23 DIAGNOSIS — E10319 Type 1 diabetes mellitus with unspecified diabetic retinopathy without macular edema: Secondary | ICD-10-CM

## 2020-06-01 MED ORDER — DEXCOM G6 SENSOR MISC: 3 refills | Status: DC

## 2020-06-08 MED ORDER — DEXCOM G6 SENSOR MISC: 3 refills | Status: DC

## 2020-06-08 MED ORDER — DEXCOM G6 TRANSMITTER MISC: 1.0000 | 3 refills | Status: DC

## 2020-06-08 NOTE — Addendum Note (Signed)
Addended by: Lauralyn Primes on: 06/08/2020 09:34 AM   Modules accepted: Orders

## 2020-06-11 ENCOUNTER — Other Ambulatory Visit: Payer: Self-pay | Admitting: Internal Medicine

## 2020-06-12 ENCOUNTER — Encounter: Payer: Self-pay | Admitting: Internal Medicine

## 2020-07-16 ENCOUNTER — Ambulatory Visit: Payer: BC Managed Care – PPO | Admitting: Internal Medicine

## 2020-07-16 ENCOUNTER — Other Ambulatory Visit: Payer: Self-pay

## 2020-07-16 ENCOUNTER — Encounter: Payer: Self-pay | Admitting: Internal Medicine

## 2020-07-16 VITALS — BP 128/88 | HR 82 | Ht 69.0 in | Wt 214.2 lb

## 2020-07-16 DIAGNOSIS — E063 Autoimmune thyroiditis: Secondary | ICD-10-CM

## 2020-07-16 DIAGNOSIS — E038 Other specified hypothyroidism: Secondary | ICD-10-CM | POA: Diagnosis not present

## 2020-07-16 DIAGNOSIS — E230 Hypopituitarism: Secondary | ICD-10-CM

## 2020-07-16 DIAGNOSIS — R7989 Other specified abnormal findings of blood chemistry: Secondary | ICD-10-CM | POA: Diagnosis not present

## 2020-07-16 DIAGNOSIS — IMO0002 Reserved for concepts with insufficient information to code with codable children: Secondary | ICD-10-CM

## 2020-07-16 DIAGNOSIS — E1065 Type 1 diabetes mellitus with hyperglycemia: Secondary | ICD-10-CM

## 2020-07-16 DIAGNOSIS — E10319 Type 1 diabetes mellitus with unspecified diabetic retinopathy without macular edema: Secondary | ICD-10-CM

## 2020-07-16 LAB — POCT GLYCOSYLATED HEMOGLOBIN (HGB A1C): Hemoglobin A1C: 9.8 % — AB (ref 4.0–5.6)

## 2020-07-16 MED ORDER — OMNIPOD DASH PODS (GEN 4) MISC
3 refills | Status: DC
Start: 2020-07-16 — End: 2020-09-25

## 2020-07-16 NOTE — Patient Instructions (Addendum)
Please  Increase: - basal rates: 12 am: 0.7 >> 0.8 5 am: 0.8  3 pm: 0.8 >> 0.9 6 pm: 1.0 >> 1.1 - ICR  12 am: 1:7 (can change to 1:6 if sugars are still high after meals)  11 am: 1:7  (can change to 1:6 if sugars are still high after meals) - target: 110-120 - ISF: 12 am: 50 8 pm: 70 - active insulin time: 4h  Change to FiAsp again.  Please schedule an appt with Antonieta Iba with nutrition.  Please continue testosterone 0.5 ml weekly.  Please have labs tomorrow, fasting, 8-9 am.   Please return in 3-4 months.

## 2020-07-16 NOTE — Progress Notes (Addendum)
Patient ID: Daniel Hill, male   DOB: 1975/08/17, 45 y.o.   MRN: [id number]   This visit occurred during the SARS-CoV-2 public health emergency.  Safety protocols were in place, including screening questions prior to the visit, additional usage of staff PPE, and extensive cleaning of exam room while observing appropriate contact time as indicated for disinfecting solutions.   HPI: Daniel Hill is a 45 y.o.-year-old male, initially referred by his nephrologist, Dr. Jimmy Footman, presenting for follow-up for DM1, uncontrolled, with complications (DKA, mild gastroparesis, proliferative retinopathy, ESRD -history of being on HD and peritoneal dialysis for 5 years, PN). Last visit 4 months ago.  Interim history: Since last visit, he had COVID-19, diagnosed 05/24/2020.  He recovered well. He had ear and tongue pain and was on prednisone 20 mg daily for 5 days last month. He is extremely busy at work (works at [employer]) to the detriment of his diabetes.  He has not been eating more bolusing consistently since last visit.  Sugars are higher.  He is not feeling well.  DM1: Reviewed history Patient has been diagnosed with diabetes at 45 y/o. She was prev. Seen by [facility] endocrinology.  She is status post kidney-pancreatic transplant in 2003, but his pancreas transplant failed in 2013.  He is considered too high risk for a new pancreatic transplant.  His retinopathy and gastroparesis improved after transplant.  He had several eye surgeries (vitrectomy and scleral buckle procedures).  He has chronic blindness in his left eye.  Reviewed HbA1c levels: Lab Results  Component Value Date   HGBA1C 9.2 (A) 03/16/2020   HGBA1C 8.7 (A) 04/03/2019   HGBA1C 9.7 (A) 12/28/2018  01/01/2020: HbA1c 9.1% 07/15/2016: HbA1c 7.8%  He was on the previous regimen: - Tresiba 10 units at bedtime - Humalog - ICR 1:15, target 120, ISF 40 - injects after meals (ends up with 2-6 units per meal)  Now on an insulin pump:   -Omnipod DASH  Pump  CGM: -Prev. On freestyle libre 2, but had problems with the sensor coming off -could not switch to Dexcom CGM (not covered) at last visit >> but now restarted  Insulin: -Previously NovoLog in the pump - still injected after meals. -Change to Fiasp at last visit (but not recently - back to Novolog)  Pump settings:  - basal rates: 12 am: 0.7 5 am: 0.8  3 pm: 0.8 6 pm: 1.0 - ICR  12 am: 1:7 (  11 am: 1:7  () - target: 110-120 - ISF: 12 am: 50 8 pm: 50 >> 70 - active insulin time: 4h TDD from basal insulin: ~11 >> 17-19 units/day >> 57% TDD from bolus insulin: ~21 units >> 4-14 units/day >> 43% Total daily dose35-60 units a day   He checks his blood sugars more than 4 times a day with his Dexcom CGM:   Previously:   Previously:   Lowest sugar was 50s >> 50s >> 47, 52; he has hypoglycemia awareness in the 60s.  No previous hypoglycemia admission.  He has an unexpired glucagon kit at home. Highest sugar was 300s >> 400s >> 400s.  He has a distant history of DKA admission-before his renal transplant.  Pt's meals are: - Breakfast: eggs or oatmeal - Lunch: meat and veggies, sandwich, soup - Dinner: mostly meat and veggies, sometimes pasta and bread - Snacks: 2  - usually apples or beef jerky.  -He has a history of ESRD, now status post renal transplant- Dr. Jimmy Footman >> Dr Posey Pronto, last BUN/creatinine:  06/23/2020: Glucose 371, BUN/creatinine 20/1.19, GFR 77 03/23/2020: Glucose 266, BUN/creatinine 16/0.87, GFR 104 01/01/2020: BUN/creatinine 70/1.13, glucose 136 10/27/2019: Glucose 163, BUN/creatinine 15/1.10, GFR 81 Lab Results  Component Value Date   BUN 13 07/28/2018   BUN 16 05/02/2018   CREATININE 0.95 07/28/2018   CREATININE 0.97 05/02/2018  02/2018: Cr 0.8  -+ HL; last set of lipids: 07/31/2019: 162/94/42/102 Lab Results  Component Value Date   CHOL 150 05/02/2018   HDL 37.40 (L) 05/02/2018   LDLCALC 96 05/02/2018   TRIG 83.0 05/02/2018    CHOLHDL 4 05/02/2018  He takes fish oil.  - last eye exam was in 2020: + DR; he is seen at [facility].  -He does have numbness and tingling in his feet.  Latest B12 level: 01/01/2020: 381  He was previously on Reglan and he has intolerance to this.  Pt has FH of DM in father.  Hashimoto's thyroiditis:  Pt is on levothyroxine 75 mcg daily, taken: - in am - fasting - at least 30 min from b'fast - no calcium - no iron - no multivitamins - no PPIs - not on Biotin  Latest TSH was normal: 06/23/2020: TSH 1.392 01/01/2020: TSH 2.502 Lab Results  Component Value Date   TSH 2.62 12/28/2018   Pt denies: - feeling nodules in neck - hoarseness - dysphagia - choking - SOB with lying down  He does have a family history of thyroid cancer.  Hypogonadotropic hypogonadism:  Reviewed pertinent labs:  He had a normal total testosterone with a low free testosterone.  No signs of hemochromatosis.  His HIV test was nonreactive.  He had a normal PSA, normal ECG and prolactin, normal cortisol.  He had a low IGF-I, also.  Latest testosterone level was reviewed: 01/01/2020: Free testosterone 3.22 (4.26-16.1), Hb/HT 14.4/43.4 at last check by PCP  Previously: Component     Latest Ref Rng & Units 04/08/2019  Testosterone, Serum (Total)     ng/dL 531  % Free Testosterone     % 0.4  Free Testosterone, S     pg/mL 21 (L)  Sex Hormone Binding Globulin     nmol/L 110.0 (H)  TIBC     250 - 450 ug/dL 251  UIBC     111 - 343 ug/dL 173  Iron     38 - 169 ug/dL 78  Iron Saturation     15 - 55 % 31  LH     1.7 - 8.6 mIU/mL 3.1   Component     Latest Ref Rng & Units 12/28/2018 02/01/2019  Testosterone, Serum (Total)     ng/dL  628  % Free Testosterone     %  0.4  Free Testosterone, S     pg/mL  25 (L)  Sex Hormone Binding Globulin     nmol/L  119.1 (H)  Iron     42 - 165 ug/dL 65   Transferrin     212.0 - 360.0 mg/dL 191.0 (L)   Saturation Ratios     20.0 - 50.0 % 24.3   IGF-I,  LC/MS     52 - 328 ng/mL 32 (L)   Z-Score (Male)     -2.0 - 2 SD -2.8 (L)   HIV     NON-REACTI NON-REACTIVE   PSA     0.10 - 4.00 ng/mL 0.64   LH     1.7 - 8.6 mIU/mL  4.9   Component     Latest Ref Rng & Units 11/09/2018  12/12/2018  Testosterone, Serum (Total)     ng/dL 494 670  % Free Testosterone     % 0.5 0.5  Free Testosterone, S     pg/mL 25 (L) 34 (L)  Sex Hormone Binding Globulin     nmol/L 111.0 (H) 136.5 (H)  Estradiol     pg/mL  29  Free Estradiol, Percent     %  1.3  Free Estradiol, Serum     pg/mL  0.38  hCG Quant     0 - 3 mIU/mL  <1  Prolactin     4.0 - 15.2 ng/mL  8.0  LH     1.7 - 8.6 mIU/mL  4.9  FSH     1.5 - 12.4 mIU/mL  3.6  Insulin-Like GF-1     84 - 270 ng/mL  43 (L)  Prostate Specific Ag, Serum     0.0 - 4.0 ng/mL  0.8  Cortisol     ug/dL  10.7   Lab Results  Component Value Date   PSA 0.64 12/28/2018   Pituitary MRI (01/18/2019): Normal pituitary MRI.  No evidence of adenoma or other abnormality.  We started him on testosterone: 01/2019: 50 mg every 2 weeks   03/2019: Testosterone level was low >> I advised him to increase the dose to 50 mg every week. However, he is was taking 100 mg (0.5 mL) every 2 weeks. 06/2019: I recommended testosterone to 50 mg (0.25 mL) every week >> however, he continued 0.5 mL every 2 weeks  Reviewed pertinent history:  He remembered being found to have a low testosterone approximately 10 years ago.  He was tried on testosterone gel and then, but came off afterwards.  He then saw Dr. Forde Dandy who also checked the testosterone and patient was told that this was normal.    His wife got pregnant after fertility tx at [facility]. Pt's sperm count was reportedly normal, but on a lower side. He admits for decreased libido Has no  difficulty obtaining but sometimes has problems maintaining an erection No trauma to testes, testicular irradiation but had surgery for L hydrocele.  He mentions that after the surgery he  developed a perineal leak (between scrotum and the rectum). He also has problems initiating urine flow  No h/o of mumps orchitis/h/o autoimmune ds. No h/o cryptorchidism He grew and went through puberty after his peers - college + mild shrinking of testes. No very small testes (<5 ml).  No incomplete/delayed sexual development     No breast discomfort/+ gynecomastia (since he was on dialysis).    No loss of body hair (axillary/pubic)/decreased need for shaving + height loss - 2 inches Lately + abnormal sense of smell  + hot flushes when his blood sugars are low No vision problems No worst HA of his life No FH of hypogonadism/infertility  No personal h/o infertility - has children No FH of hemochromatosis or pituitary tumors No excessive weight gain or loss.  No chronic pain. Not on opiates, but Tramadol bid. He does not take steroids.  No alcohol No anabolic steroids use No herbal medicines + antidepressants: Lexapro No AI ds in his family.  She saw urology.  Dr. Kathlen Mody. She has OSA-on CPAP He had a rectal fistula surgery at the beginning of 2021. He takes a vitamin D supplement. Latest B12 vitamin was on 06/23/2020: Normal, at 670  ROS: Constitutional: no weight gain/no weight loss, no fatigue, no subjective hyperthermia, no subjective hypothermia Eyes: no blurry vision, no  xerophthalmia ENT: no sore throat, + see HPI Cardiovascular: no CP/no SOB/no palpitations/no leg swelling Respiratory: no cough/no SOB/no wheezing Gastrointestinal: no N/no V/no D/no C/no acid reflux Musculoskeletal: no muscle aches/no joint aches Skin: no rashes, no hair loss Neurological: + tremors (chronic, from Prograft)/+ numbness/+ tingling/no dizziness  I reviewed pt's medications, allergies, PMH, social hx, family hx, and changes were documented in the history of present illness. Otherwise, unchanged from my initial visit note.  Past Medical History:  Diagnosis Date  . Anxiety   . Depression    . Diabetes mellitus   . GERD (gastroesophageal reflux disease)   . Hypothyroidism   . Kidney replaced by transplant   . Pancreas transplanted ([place])   . Thyroid disease    Past Surgical History:  Procedure Laterality Date  . EYE SURGERY     Social History   Socioeconomic History  . Marital status: Married    Spouse name: Not on file  . children: yes  . Years of education: Not on file  . 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 Smoking status: Never Smoker  . Smokeless tobacco: Never Used  Substance and Sexual Activity  . Alcohol use:  Beer very seldom  . Drug use: No   Current Outpatient Medications on File Prior to Visit  Medication Sig Dispense Refill  . Continuous Blood Gluc Sensor (DEXCOM G6 SENSOR) MISC APPLY SENSOR AS DIRECTED TO BODY FOR CONTINUOUS GLUCOSE MONITORING. CHANGE SENSOR EVERY 10 DAYS. 9 each 3  . Continuous Blood Gluc Transmit (DEXCOM G6 TRANSMITTER) MISC 1 Device by Does not apply route every 3 (three) months. 1 each 3  . escitalopram (LEXAPRO) 10 MG tablet Take 10 mg by mouth 2 (two) times daily.    . Glucagon (BAQSIMI TWO PACK) 3 MG/DOSE POWD Place 3 mg into the nose once as needed for up to 1 dose. 1 each 11  . Insulin Aspart, w/Niacinamide, (FIASP) 100 UNIT/ML SOLN Use up to 50 units a day in the insulin pump 40 mL 3  . Insulin Disposable Pump (OMNIPOD DASH 5 PACK PODS) MISC APPLY EVERY 3 DAYS AS DIRECTED 30 each 3  . levothyroxine (SYNTHROID) 75 MCG tablet TAKE 1 TABLET(75 MCG) BY MOUTH DAILY 90 tablet 3  . Multiple Vitamins-Minerals (MULTIVITAMINS THER. W/MINERALS) TABS Take 1 tablet by mouth daily.    . ondansetron (ZOFRAN) 4 MG tablet Take 1 tablet (4 mg total) by mouth every 8 (eight) hours as needed for nausea or vomiting. 15 tablet 0  . SYRINGE/NEEDLE, DISP, 1 ML 23G X 1" 1 ML MISC Use every week 50 each 1  . tacrolimus (PROGRAF) 1 MG capsule Take 3 mg by mouth 2 (two) times  daily.    Marland Kitchen testosterone cypionate (DEPOTESTOSTERONE CYPIONATE) 200 MG/ML injection NJECT 0.5 ML IN THE MUSCLE EVERY 14 DAYS (SINGLE USE VIALS) 3 mL 3  . traMADol (ULTRAM) 50 MG tablet Take 50-100 mg by mouth every 6 (six) hours as needed for pain.    . TUBERCULIN SYR 1CC/25GX5/8" (B-D TB SYRINGE 1CC/25GX5/8") 25G X 5/8" 1 ML MISC USE 1 EVERY 2 WEEKS 8 each 12  . Vitamin D, Ergocalciferol, (DRISDOL) 1.25 MG (50000 UT) CAPS capsule Take 50,000 Units by mouth every Saturday.      No current facility-administered medications on file prior to visit.    Allergies  Allergen Reactions  . Codeine Nausea And Vomiting  . Metoclopramide Hcl Nausea And Vomiting  Family History  Problem Relation Age of Onset  . Stroke Mother   . Lymphoma Father   . Diabetes type II Father    PE: BP 128/88 (BP Location: Right Arm, Patient Position: Sitting, Cuff Size: Normal)   Pulse 82   Ht 5' 9"  (1.753 m)   Wt 214 lb 3.2 oz (97.2 kg)   SpO2 97%   BMI 31.63 kg/m  Wt Readings from Last 3 Encounters:  07/16/20 214 lb 3.2 oz (97.2 kg)  03/16/20 214 lb 9.6 oz (97.3 kg)  04/03/19 216 lb (98 kg)   Constitutional: overweight, in NAD Eyes: PERRLA, EOMI, no exophthalmos ENT: moist mucous membranes, no thyromegaly, no cervical lymphadenopathy Cardiovascular: RRR, No MRG Respiratory: CTA B Gastrointestinal: abdomen soft, NT, ND, BS+ Musculoskeletal: no deformities, strength intact in all 4 Skin: moist, warm, no rashes Neurological: + tremor with outstretched hands, DTR normal in all 4  ASSESSMENT: 1. DM1, uncontrolled, without complications - h/o DKA - mild gastroparesis - proliferative retinopathy - ESRD -history of being on HD and peritoneal dialysis for 5 years - PN  2. Hashimoto's thyroiditis  3.  Hypogonadotropic  4. Low IGF1  PLAN:  1. Patient with longstanding, uncontrolled type 1 diabetes, on an Omnipod DASH insulin pump.  She had a freestyle libre CGM as the Dexcom CGM was not covered for  him >> now back on the Dexcom sensor and plans to get the Omnipod 5 pump as soon as this is out. -At last visit, HbA1c was higher, at 9.2%.  At that time, sugars were increasing after meals and also increasing significantly overnight.  Upon questioning, he was still injecting his NovoLog after meals, and we discussed that this is not conducive to good control.  I suggested to change from NovoLog to [place] to be able to be injected at the start of the meal, rather than 15 minutes before.  We also discussed about possibly strengthening his insulin to carb ratios after the above change, if sugars are still high after meals.  I also suggested to change from [place] to almond milk he is currently says is good habit be contributing to the significant increase in blood sugars after breakfast.  We also changed his insulin sensitivity factor as he felt that his sugars were dropping abruptly after correction. CGM interpretation: -At today's visit, we reviewed his CGM downloads: It appears that only 25.8% of values are in target range (goal >70%), while 73.9% are higher than 180 (goal <25%), and 0.3% are lower than 70 (goal <4%).  The calculated average blood sugar is 252 - extremely high.  The projected HbA1c for the next 3 months (GMI) is 9.3%. -Reviewing the CGM trends, his sugars are very high throughout the day, and they start to increase after breakfast.  They continue to increase in a stepwise fashion with the largest increase after dinner.  The sugars are actually extremely fluctuating after dinner, but the majority of them average approximately 300s.  Upon questioning, and upon review of his pump downloads, it appears that he is not introducing carbs into the pump consistently.  He has entire days in which he does not introduce any carbs and other days in which he introduces only 15 or 20 units for a meal.  We discussed that this is not conducive to good control.  He absolutely needs to enter all of his carbs every  time he eats and start the boluses at the start of the meal if he goes back to [place] or  15 minutes before if he stays with NovoLog.  He would like to go back to [place].  He does have this at home. -He did not make the recommended changes in insulin to carb ratios despite the high blood sugars that he experienced at last visit.  I would not suggest to change the ICRs now, but he first needs to work on compliance.  If he is not bolusing with every meal and introducing all the carbs into the pump, we would not be able to control his diabetes regardless of his ICR's.  However, for now, since sugars are so high throughout the day and especially at night, I suggested to increase his basal rates from 3 PM to 5 AM. -He also needs help with organizing his meals and making sure that he cuts the carbs correctly and he agrees with a referral to nutrition. He plans to start exercising and I think this will also help his blood sugars.  However, he is very busy and is difficult for him to find time to do so. -He feels that some of his blood sugars are high due to scar tissue so we discussed about rotating the infusion sites -I reviewed his most recent labs per records from [facility] and he had a very high glucose of 371 while in the office.  He does describe that he is not feeling well and we discussed that with such high blood sugars, it is expected to have malaise and fatigue. -We discussed about different pumps on the market.  He is wondering whether he should get the Omnipod 5 or wait until the newer Medtronic pump or the iLet pump is out of the market.  I strongly recommended to get the Omnipod 5 as soon as it comes out.  I did advise him, however, that if he does not bolus for the meals, this pump will not help too much. -I suggested to: Patient Instructions  Please  Increase: - basal rates: 12 am: 0.7 >> 0.8 5 am: 0.8  3 pm: 0.8 >> 0.9 6 pm: 1.0 >> 1.1 - ICR  12 am: 1:7 (can change to 1:6 if sugars are still high  after meals)  11 am: 1:7  (can change to 1:6 if sugars are still high after meals) - target: 110-120 - ISF: 12 am: 50 8 pm: 70 - active insulin time: 4h  Change to FiAsp again.  Please schedule an appt with Antonieta Iba with nutrition.  Please continue testosterone 0.5 ml weekly.  Please have labs tomorrow, fasting, 8-9 am.   Please return in 3-4 months.  - we checked his HbA1c: 9.8% (higher), however, reviewing his records from [place], he had an HbA1c of 9.9% earlier this month. - advised to check sugars at different times of the day - 4x a day, rotating check times - advised for yearly eye exams >> he is UTD - return to clinic in 3-4 months   2. Hashimoto's thyroiditis - latest thyroid labs reviewed with pt >> normal earlier this month  - he continues on LT4 75 mcg daily - pt feels good on this dose. - we discussed about taking the thyroid hormone every day, with water, >30 minutes before breakfast, separated by >4 hours from acid reflux medications, calcium, iron, multivitamins. Pt. is taking it correctly.  3.  Hypogonadotropic hypogonadism -This was found during investigation for low libido.  The total testosterone was normal while SHBG was high and free testosterone was low.  Further investigation pointed towards mild  hypogonadotropic hypogonadism.  A pituitary MRI was negative for tumor. He reported previous testicular surgery and a possible leak in the perineum, and since he also had problems initiating urine stream (possible BPH), I referred him to urology.  He had a benign evaluation and was cleared for testosterone treatment.  PSA and hematocrit levels were normal before starting testosterone.  Recent starting testosterone in 01/2019: 50 mg every 14 days.  However, his testosterone level was, so I advised him to increase the dose to 50 mg every 7 days.  He misunderstood instructions and was taking 100 mg (0.5 mL of 200 mg/mL) every 2 weeks.  I advised him to take 0.25 mL every  week (50 mg daily).  At last visit, she was still taking 0.5 mL every 2 weeks and mentions that this was working better for him.  We continued the same dose. -She reports feeling better after starting testosterone, with improved libido and energy.  At last visit, however, he started to have hot flashes.  At his visit with PCP, his free testosterone level was slightly low, at 3.22 (4.96-16.4).  He could not remember exactly when the labs were drawn (whether they were drawn in the morning or halfway between injections). -Of note, CBC drawn on 01/01/2020 was normal -We will need to recheck his testosterone levels halfway between injections, fasting, between 8 and 9 AM -[facility] -I encouraged him to have annual DRE with PCP or urologist  4. Low IGF1 -He had a low IGF-I twice during pituitary work-up -Pituitary MRI reveals no tumor -His low IGF-I was most likely related to hypovolemia.  This is usually corrected after testosterone supplementation.  He is not following a restrictive diet, does not have chronic kidney or liver disease (although he does have a history of ESRD with a functioning kidney transplant in place). -We will repeat an IGF-I at the next lab draw  Orders Placed This Encounter  Procedures  . PSA  . Amb ref to Medical Nutrition Therapy-MNT  . POCT glycosylated hemoglobin (Hb A1C)   Total time spent for the visit: 40 min, in discussing with the patient, reviewing his chart including labs in our system and records from [place], reviewing his pump downloads, discussing his injection technique, reviewing hishyper-glycemic episodes, reviewing pump settings and developing a plan to avoid hypo- and hyper-glycemia.  We also discussed about his other endocrine problems (see above).  Component     Latest Ref Rng & Units 07/21/2020  Testosterone, Serum (Total)     ng/dL 597  % Free Testosterone     % 0.5  Free Testosterone, S     pg/mL 30 (L)  Sex Hormone Binding Globulin     nmol/L 93.5 (H)   Hemoglobin A1C     4.8 - 5.6 % 10.1 (H)  Est. average glucose Bld gHb Est-mCnc     mg/dL 243  T4,Free(Direct)     0.82 - 1.77 ng/dL 1.21  TSH     0.450 - 4.500 uIU/mL 3.660  Insulin-Like GF-1     84 - 270 ng/mL 54 (L)  Prostate Specific Ag, Serum     0.0 - 4.0 ng/mL 0.8   His IGF-I remains slightly low, possibly related to his diabetes control.  Total testosterone is normal while SHBG is high.  Therefore, the calculated free testosterone level is actually normal, at 60 ng/mL.  We can continue the same dose of testosterone.  Philemon Kingdom, MD PhD [facility] Endocrinology

## 2020-07-23 ENCOUNTER — Encounter: Payer: Self-pay | Admitting: Internal Medicine

## 2020-07-27 ENCOUNTER — Encounter: Payer: Self-pay | Admitting: Internal Medicine

## 2020-07-28 LAB — TESTOSTERONE, FREE AND TOTAL (INCLUDES SHBG)-(MALES)
% Free Testosterone: 0.5 %
Free Testosterone, S: 30 pg/mL — ABNORMAL LOW
Sex Hormone Binding Globulin: 93.5 nmol/L — ABNORMAL HIGH
Testosterone, Serum (Total): 597 ng/dL

## 2020-07-28 LAB — TSH: TSH: 3.66 u[IU]/mL (ref 0.450–4.500)

## 2020-07-28 LAB — HEMOGLOBIN A1C
Est. average glucose Bld gHb Est-mCnc: 243 mg/dL
Hgb A1c MFr Bld: 10.1 % — ABNORMAL HIGH (ref 4.8–5.6)

## 2020-07-28 LAB — INSULIN-LIKE GROWTH FACTOR: Insulin-Like GF-1: 54 ng/mL — ABNORMAL LOW (ref 84–270)

## 2020-07-28 LAB — PSA: Prostate Specific Ag, Serum: 0.8 ng/mL (ref 0.0–4.0)

## 2020-07-28 LAB — T4, FREE: Free T4: 1.21 ng/dL (ref 0.82–1.77)

## 2020-07-30 ENCOUNTER — Other Ambulatory Visit: Payer: Self-pay | Admitting: Internal Medicine

## 2020-08-10 ENCOUNTER — Encounter: Payer: Self-pay | Admitting: Internal Medicine

## 2020-09-07 ENCOUNTER — Other Ambulatory Visit: Payer: Self-pay | Admitting: *Deleted

## 2020-09-07 ENCOUNTER — Encounter: Payer: Self-pay | Admitting: Internal Medicine

## 2020-09-07 DIAGNOSIS — E10319 Type 1 diabetes mellitus with unspecified diabetic retinopathy without macular edema: Secondary | ICD-10-CM

## 2020-09-07 DIAGNOSIS — IMO0002 Reserved for concepts with insufficient information to code with codable children: Secondary | ICD-10-CM

## 2020-09-07 MED ORDER — DEXCOM G6 TRANSMITTER MISC: 1.0000 | 3 refills | Status: DC

## 2020-09-23 ENCOUNTER — Encounter: Payer: Self-pay | Admitting: Internal Medicine

## 2020-09-24 ENCOUNTER — Encounter: Payer: Self-pay | Admitting: Internal Medicine

## 2020-09-25 ENCOUNTER — Other Ambulatory Visit: Payer: Self-pay | Admitting: Internal Medicine

## 2020-09-25 MED ORDER — OMNIPOD 5 DEXG7G6 PODS GEN 5 MISC: 1.0000 | 3 refills | Status: DC

## 2020-09-25 MED ORDER — OMNIPOD 5 DEXG7G6 INTRO GEN 5 KIT: 1.0000 | PACK | Freq: Once | 0 refills | Status: AC

## 2020-09-29 ENCOUNTER — Encounter: Payer: Self-pay | Admitting: Internal Medicine

## 2020-09-30 NOTE — Telephone Encounter (Signed)
Phoned him and gave him the number

## 2020-10-08 ENCOUNTER — Telehealth: Payer: Self-pay | Admitting: Pharmacy Technician

## 2020-10-08 NOTE — Telephone Encounter (Signed)
Patient Advocate Encounter   Received notification from [facility] that prior authorization for OMNIPOD 5 G6 MIS PODS is required.   Status is DENIED.  Omnipod DASH is covered: [facility] [id number] Pods:  [place] [id number]    [facility] will continue to follow.   Venida Jarvis. Nadara Mustard, CPhT Patient Advocate [facility] Phone: [phone] Fax:  [phone]

## 2020-10-08 NOTE — Telephone Encounter (Addendum)
Patient Advocate Encounter  I spoke with the patient and explained I filed for a OMNIPOD 5 G6 prior authorization and it was denied.  It is a plan exclusion.  The plan will cover Omnipod Dash.  He explained it is not working for him  The next step is to elevate to level 2, if provider deems appropriate, is for the provider to call the plan and explain why the Omnipod 5 is medically needed (a written appeal will not be accepted).   Venida Jarvis. Nadara Mustard, CPhT Patient Advocate [facility] Phone: [phone] Fax:  [phone]

## 2020-10-15 ENCOUNTER — Encounter: Payer: Self-pay | Admitting: Internal Medicine

## 2020-10-22 NOTE — Telephone Encounter (Signed)
Can you please follow up with this

## 2020-11-02 ENCOUNTER — Encounter: Payer: Self-pay | Admitting: Internal Medicine

## 2020-11-02 ENCOUNTER — Other Ambulatory Visit: Payer: Self-pay

## 2020-11-02 ENCOUNTER — Other Ambulatory Visit: Payer: Self-pay | Admitting: Internal Medicine

## 2020-11-02 ENCOUNTER — Ambulatory Visit (INDEPENDENT_AMBULATORY_CARE_PROVIDER_SITE_OTHER): Payer: BC Managed Care – PPO | Admitting: Internal Medicine

## 2020-11-02 VITALS — BP 128/82 | HR 78 | Ht 69.0 in | Wt 211.6 lb

## 2020-11-02 DIAGNOSIS — E1065 Type 1 diabetes mellitus with hyperglycemia: Secondary | ICD-10-CM | POA: Diagnosis not present

## 2020-11-02 DIAGNOSIS — E10319 Type 1 diabetes mellitus with unspecified diabetic retinopathy without macular edema: Secondary | ICD-10-CM

## 2020-11-02 DIAGNOSIS — E038 Other specified hypothyroidism: Secondary | ICD-10-CM

## 2020-11-02 DIAGNOSIS — E063 Autoimmune thyroiditis: Secondary | ICD-10-CM

## 2020-11-02 DIAGNOSIS — E230 Hypopituitarism: Secondary | ICD-10-CM | POA: Diagnosis not present

## 2020-11-02 DIAGNOSIS — IMO0002 Reserved for concepts with insufficient information to code with codable children: Secondary | ICD-10-CM

## 2020-11-02 DIAGNOSIS — R7989 Other specified abnormal findings of blood chemistry: Secondary | ICD-10-CM | POA: Diagnosis not present

## 2020-11-02 LAB — POCT GLYCOSYLATED HEMOGLOBIN (HGB A1C): Hemoglobin A1C: 8.9 % — AB (ref 4.0–5.6)

## 2020-11-02 LAB — LIPID PANEL
Cholesterol: 163 mg/dL (ref 0–200)
HDL: 47.7 mg/dL (ref >39.00)
LDL Cholesterol: 106 mg/dL — ABNORMAL HIGH (ref 0–99)
NonHDL: 114.93
Total CHOL/HDL Ratio: 3
Triglycerides: 46 mg/dL (ref 0.0–149.0)
VLDL: 9.2 mg/dL (ref 0.0–40.0)

## 2020-11-02 LAB — MICROALBUMIN / CREATININE URINE RATIO
Creatinine,U: 55.9 mg/dL
Microalb Creat Ratio: 1.3 mg/g (ref 0.0–30.0)
Microalb, Ur: <0.7 mg/dL (ref 0.0–1.9)

## 2020-11-02 MED ORDER — OMNIPOD 5 DEXG7G6 PODS GEN 5 MISC: 1.0000 | 3 refills | Status: DC

## 2020-11-02 NOTE — Progress Notes (Signed)
Patient ID: Daniel Hill, male   DOB: Jun 21, 1975, 45 y.o.   MRN: [id number]   This visit occurred during the SARS-CoV-2 public health emergency.  Safety protocols were in place, including screening questions prior to the visit, additional usage of staff PPE, and extensive cleaning of exam room while observing appropriate contact time as indicated for disinfecting solutions.   HPI: Daniel Hill is a 45 y.o.-year-old male, initially referred by his nephrologist, Dr. Jimmy Footman, presenting for follow-up for DM1, uncontrolled, with complications (DKA, mild gastroparesis, proliferative retinopathy, ESRD -history of being on HD and peritoneal dialysis for 5 years, PN). Last visit 4 months ago.  Interim history: He continues to be extremely busy at work (works at [employer]) due to the treatment of his diabetes. He stopped Tramadol 3 weeks ago >> pain manageable. He has increased fatigue.  DM1: Reviewed history: Patient has been diagnosed with diabetes at 45 y/o. She was prev. Seen by [facility] endocrinology.  She is status post kidney-pancreatic transplant in 2003, but his pancreas transplant failed in 2013.  He is considered too high risk for a new pancreatic transplant.  His retinopathy and gastroparesis improved after transplant.  He had several eye surgeries (vitrectomy and scleral buckle procedures).  He has chronic blindness in his left eye.  Reviewed HbA1c levels: Lab Results  Component Value Date   HGBA1C 10.1 (H) 07/21/2020   HGBA1C 9.8 (A) 07/16/2020   HGBA1C 9.2 (A) 03/16/2020  01/01/2020: HbA1c 9.1% 07/15/2016: HbA1c 7.8%  He was on the previous regimen: - Tresiba 10 units at bedtime - Humalog - ICR 1:15, target 120, ISF 40 - injects after meals (ends up with 2-6 units per meal)  Now on an insulin pump:  -Omnipod DASH  Pump, but would want to switch to OmniPod 5  CGM: -Prev. On freestyle libre 2, but had problems with the sensor coming off -Initially Dexcom CGM not covered, but now  on Dexcom G6  Insulin: -Previously NovoLog in the pump -Now Fiasp  Pump settings  -reviewing his pump settings today, he actually did not make any other changes but I suggested at last visit:  - basal rates: 12 am: 0.7   5 am: 0.8  3 pm: 0.8 6 pm: 1.0 - ICR  12 am: 1:7  11 am: 1:7  - target: 110-120 - ISF: 12 am: 50 8 pm: 70 - active insulin time: 4h TDD from basal insulin: ~11 >> 17-19 units/day >> 57% >> 47% (18.5 units) TDD from bolus insulin: ~21 units >> 4-14 units/day >> 43% >> 53% Total daily dose 35-60 units a day    He checks his blood sugars more than 4 times a day with his Dexcom CGM:   Previously:   Lowest sugar was 50s >> 47, 52 >> 100; he has hypoglycemia awareness in the 60s.  No previous hypoglycemia admission.  He has an unexpired glucagon kit at home. Highest sugar was 400s >> 400s >> 500.  He has a distant history of DKA admission-before his renal transplant.  Pt's meals are: - Breakfast: eggs or oatmeal - Lunch: meat and veggies, sandwich, soup - Dinner: mostly meat and veggies, sometimes pasta and bread - Snacks: 2  - usually apples or beef jerky.  -He has a history of ESRD, now status post renal transplant- Dr. Jimmy Footman >> Dr Posey Pronto, last BUN/creatinine:  06/23/2020: Glucose 371, BUN/creatinine 20/1.19, GFR 77 03/23/2020: Glucose 266, BUN/creatinine 16/0.87, GFR 104 01/01/2020: BUN/creatinine 70/1.13, glucose 136 10/27/2019: Glucose 163, BUN/creatinine 15/1.10, GFR  81 Lab Results  Component Value Date   BUN 13 07/28/2018   BUN 16 05/02/2018   CREATININE 0.95 07/28/2018   CREATININE 0.97 05/02/2018  02/2018: Cr 0.8  -+ HL; last set of lipids: 07/31/2019: 162/94/42/102 Lab Results  Component Value Date   CHOL 150 05/02/2018   HDL 37.40 (L) 05/02/2018   LDLCALC 96 05/02/2018   TRIG 83.0 05/02/2018   CHOLHDL 4 05/02/2018  He takes fish oil.  - last eye exam was in 2020: + DR; he is seen at [facility].  -He does have numbness and tingling in  his feet.  Latest B12 level: 01/01/2020: 381  He was previously on Reglan and he has intolerance to this.  Pt has FH of DM in father.  Hashimoto's thyroiditis:  Pt is on levothyroxine 75 mcg daily, taken: - in am - fasting - at least 30 min from b'fast - no calcium - no iron - no multivitamins - no PPIs - not on Biotin  TSH levels were normal: Lab Results  Component Value Date   TSH 3.660 07/21/2020  06/23/2020: TSH 1.392 01/01/2020: TSH 2.502  Pt denies: - feeling nodules in neck - hoarseness - dysphagia - choking - SOB with lying down  He does have a family history of thyroid cancer.  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  Previously taking 0.5 mL every 2 weeks, now 0.25 mL every week.  He has much better.  Reviewed history:  He had a normal total testosterone with a low free testosterone.   No signs of hemochromatosis.   His HIV test was nonreactive.   He had a normal PSA, normal ECG and prolactin, normal cortisol.   He had a low IGF-I, also.  Pertinent labs were reviewed: Component     Latest Ref Rng & Units 07/21/2020  Testosterone, Serum (Total)     ng/dL 597  % Free Testosterone     % 0.5  Free Testosterone, S     pg/mL 30 (L)  Sex Hormone Binding Globulin     nmol/L 93.5 (H)  Hemoglobin A1C     4.8 - 5.6 % 10.1 (H)  Est. average glucose Bld gHb Est-mCnc     mg/dL 243  T4,Free(Direct)     0.82 - 1.77 ng/dL 1.21  TSH     0.450 - 4.500 uIU/mL 3.660  Insulin-Like GF-1     84 - 270 ng/mL 54 (L)  Prostate Specific Ag, Serum     0.0 - 4.0 ng/mL 0.8   IGF-I remained slightly low, possibly related to his diabetes control.  Total testosterone is normal while SHBG is high >> the calculated free testosterone level = actually normal, at 60 ng/mL.    Previously: 01/01/2020: Free testosterone 3.22 (4.26-16.1), Hb/HT 14.4/43.4 at last check by PCP  Component     Latest Ref Rng & Units 04/08/2019   Testosterone, Serum (Total)     ng/dL 531  % Free Testosterone     % 0.4  Free Testosterone, S     pg/mL 21 (L)  Sex Hormone Binding Globulin     nmol/L 110.0 (H)  TIBC     250 - 450 ug/dL 251  UIBC     111 - 343 ug/dL 173  Iron     38 - 169 ug/dL 78  Iron Saturation     15 - 55 % 31  LH     1.7 - 8.6 mIU/mL 3.1   Component  Latest Ref Rng & Units 12/28/2018 02/01/2019  Testosterone, Serum (Total)     ng/dL  628  % Free Testosterone     %  0.4  Free Testosterone, S     pg/mL  25 (L)  Sex Hormone Binding Globulin     nmol/L  119.1 (H)  Iron     42 - 165 ug/dL 65   Transferrin     212.0 - 360.0 mg/dL 191.0 (L)   Saturation Ratios     20.0 - 50.0 % 24.3   IGF-I, LC/MS     52 - 328 ng/mL 32 (L)   Z-Score (Male)     -2.0 - 2 SD -2.8 (L)   HIV     NON-REACTI NON-REACTIVE   PSA     0.10 - 4.00 ng/mL 0.64   LH     1.7 - 8.6 mIU/mL  4.9   Component     Latest Ref Rng & Units 11/09/2018 12/12/2018  Testosterone, Serum (Total)     ng/dL 494 670  % Free Testosterone     % 0.5 0.5  Free Testosterone, S     pg/mL 25 (L) 34 (L)  Sex Hormone Binding Globulin     nmol/L 111.0 (H) 136.5 (H)  Estradiol     pg/mL  29  Free Estradiol, Percent     %  1.3  Free Estradiol, Serum     pg/mL  0.38  hCG Quant     0 - 3 mIU/mL  <1  Prolactin     4.0 - 15.2 ng/mL  8.0  LH     1.7 - 8.6 mIU/mL  4.9  FSH     1.5 - 12.4 mIU/mL  3.6  Insulin-Like GF-1     84 - 270 ng/mL  43 (L)  Prostate Specific Ag, Serum     0.0 - 4.0 ng/mL  0.8  Cortisol     ug/dL  10.7   Lab Results  Component Value Date   PSA 0.64 12/28/2018   Pituitary MRI (01/18/2019): Normal pituitary MRI.  No evidence of adenoma or other abnormality.  We started him on testosterone: 01/2019: 50 mg every 2 weeks   03/2019: Testosterone level was low >> I advised him to increase the dose to 50 mg every week. However, he is was taking 100 mg (0.5 mL) every 2 weeks. 06/2019: I recommended testosterone to 50 mg  (0.25 mL) every week >> however, he continued 0.5 mL every 2 weeks  06/2020: He finally started 0.25 ml q 2 weeks >> feels much better  Reviewed pertinent history:  He remembered being found to have a low testosterone approximately 10 years ago.  He was tried on testosterone gel and then, but came off afterwards.  He then saw Dr. Forde Dandy who also checked the testosterone and patient was told that this was normal.    His wife got pregnant after [facility] tx [facility]. Pt's sperm count was reportedly normal, but on a lower side. He admits for decreased libido Has no  difficulty obtaining but sometimes has problems maintaining an erection No trauma to testes, testicular irradiation but had surgery for L hydrocele.  He mentions that after the surgery he developed a perineal leak (between scrotum and the rectum). He also has problems initiating urine flow  No h/o of mumps orchitis/h/o autoimmune ds. No h/o cryptorchidism He grew and went through puberty after his peers - college + mild shrinking of testes. No very small testes (<  5 ml).  No incomplete/delayed sexual development     No breast discomfort/+ gynecomastia (since he was on dialysis).    No loss of body hair (axillary/pubic)/decreased need for shaving + height loss - 2 inches Lately + abnormal sense of smell  + hot flushes when his blood sugars are low No vision problems No worst HA of his life No FH of hypogonadism/infertility  No personal h/o infertility - has children No FH of hemochromatosis or pituitary tumors No excessive weight gain or loss.  No chronic pain. Not on opiates, but Tramadol bid >> now off. He does not take steroids.  No alcohol No anabolic steroids use No herbal medicines + antidepressants: Lexapro No AI ds in his family.  He sees urology.  Dr. Kathlen Mody. He has OSA-on CPAP He had a rectal fistula surgery at the beginning of 2021. He takes a vitamin D supplement. Latest B12 vitamin was on 06/23/2020:  Normal, at 670  ROS: Constitutional: no weight gain/no weight loss, no fatigue, no subjective hyperthermia, no subjective hypothermia Eyes: no blurry vision, no xerophthalmia ENT: no sore throat, + see HPI Cardiovascular: no CP/no SOB/no palpitations/no leg swelling Respiratory: no cough/no SOB/no wheezing Gastrointestinal: no N/no V/no D/no C/no acid reflux Musculoskeletal: no muscle aches/no joint aches Skin: no rashes, no hair loss Neurological: + tremors (chronic, from Prograft)/+ numbness/+ tingling/no dizziness  I reviewed pt's medications, allergies, PMH, social hx, family hx, and changes were documented in the history of present illness. Otherwise, unchanged from my initial visit note.  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 CHANGE SENSOR EVERY 10 DAYS. 9 each 3   Continuous Blood Gluc Transmit (DEXCOM G6 TRANSMITTER) MISC 1 Device by Does not apply route every 3 (three) months. 1 each 3   escitalopram (LEXAPRO) 10 MG tablet Take 10 mg by mouth 2 (two) times daily.     Glucagon (BAQSIMI TWO PACK) 3 MG/DOSE POWD  Place 3 mg into the nose once as needed for up to 1 dose. 1 each 11   Insulin Aspart, w/Niacinamide, (FIASP) 100 UNIT/ML SOLN Use up to 50 units a day in the insulin pump 40 mL 3   Insulin Disposable Pump (OMNIPOD 5 G6 POD, GEN 5,) MISC 1 each by Does not apply route every 3 (three) days. 30 each 3   levothyroxine (SYNTHROID) 75 MCG tablet TAKE 1 TABLET(75 MCG) BY MOUTH DAILY 90 tablet 3   Multiple Vitamins-Minerals (MULTIVITAMINS THER. W/MINERALS) TABS Take 1 tablet by mouth  daily.     ondansetron (ZOFRAN) 4 MG tablet Take 1 tablet (4 mg total) by mouth every 8 (eight) hours as needed for nausea or vomiting. 15 tablet 0   SYRINGE/NEEDLE, DISP, 1 ML 23G X 1" 1 ML MISC Use every week 50 each 1   tacrolimus (PROGRAF) 1 MG capsule Take 3 mg by mouth 2 (two) times daily.     testosterone cypionate (DEPOTESTOSTERONE CYPIONATE) 200 MG/ML injection INJECT 0.5 ML IN THE MUSCLE EVERY 14 DAYS (SINGLE USE VIALS) 3 mL 3   traMADol (ULTRAM) 50 MG tablet Take 50-100 mg by mouth every 6 (six) hours as needed for pain.     TUBERCULIN SYR 1CC/25GX5/8" (B-D TB SYRINGE 1CC/25GX5/8") 25G X 5/8" 1 ML MISC USE 1 EVERY 2 WEEKS 8 each 12   Vitamin D, Ergocalciferol, (DRISDOL) 1.25 MG (50000 UT) CAPS capsule Take 50,000 Units by mouth every Saturday.      No current facility-administered medications on file prior to visit.    Allergies  Allergen Reactions   Codeine Nausea And Vomiting   Metoclopramide Hcl Nausea And Vomiting   Family History  Problem Relation Age of Onset   Stroke Mother    Lymphoma Father    Diabetes type II Father    PE: BP 128/82 (BP Location: Right Arm, Patient Position: Sitting, Cuff Size: Normal)   Pulse 78   Ht 5' 9"  (1.753 m)   Wt 211 lb 9.6 oz (96 kg)   SpO2 99%   BMI 31.25 kg/m  Wt Readings from Last 3 Encounters:  11/02/20 211 lb 9.6 oz (96 kg)  07/16/20 214 lb 3.2 oz (97.2 kg)  03/16/20 214 lb 9.6 oz (97.3 kg)   Constitutional: overweight, in NAD Eyes: PERRLA, EOMI, no  exophthalmos ENT: moist mucous membranes, no thyromegaly, no cervical lymphadenopathy Cardiovascular: RRR, No MRG Respiratory: CTA B Gastrointestinal: abdomen soft, NT, ND, BS+ Musculoskeletal: no deformities, strength intact in all 4 Skin: moist, warm, no rashes Neurological: + tremor with outstretched hands, DTR normal in all 4  ASSESSMENT: 1. DM1, uncontrolled, without complications - h/o DKA - mild gastroparesis - proliferative retinopathy - ESRD -history of being on HD and peritoneal dialysis for 5 years - PN  2. Hashimoto's thyroiditis  3.  Hypogonadotropic  4. Low IGF1  PLAN:  1. Patient with longstanding, uncontrolled, type 1 diabetes, on OmniPod Dash insulin pump.  She had a freestyle libre CGM at the [facility] CGM was not initially covered for him but then was able to switch to the Dexcom G6 sensor.  Since last visit we tried very hard to obtain him the OmniPod 5 insulin pump but this was initially not approved by his insurance.  After back-and-forth with the insurance and the OmniPod rep, we found out that this may be covered starting this month.  We will send a new prescription to his pharmacy.  It is important for him to obtain this since this is the only version of OmniPod that he is able to integrate with the sensor in a closed-loop fashion. -At last visit, sugars are very high throughout the day and they were starting to increase after breakfast.  They continued to increase in a stepwise fashion throughout the day with the largest increase after dinner.  The sugars were otherwise extremely fluctuating especially after dinner with the majority of them approximately in the 300s.  Upon questioning and review of his pump downloads, he was not introducing enough carbs into the pump consistently.  He had entire  days in which he did not introduce any carbs at all and other days in which he only introduced 15 to 20 units with a meal.  I advised him that this was not conducive to good  control.  I referred him to nutrition but he did not have this appointment yet.  He is very busy at work.  We discussed about rotating the infusion site as he felt that some of his blood sugars were high due to scar tissue.  He also needed help with organizing his meals and I also recommended to try to start exercising. CGM interpretation: -At today's visit, we reviewed his CGM downloads: It appears that 14.4% of values are in target range (goal >70%), while 85.6% are higher than 180 (goal <25%), and 0% are lower than 70 (goal <4%).  The calculated average blood sugar is 295.  The projected HbA1c for the next 3 months (GMI) is 10.1%. -Reviewing the CGM trends, sugars are still extremely high, even higher than before, at all times of the day.  He only touches the normal interval approximately at 1 PM, but otherwise the sugars increase in a stepwise fashion throughout the day and start decreasing around 4 AM.  Upon questioning, he is still not bolusing before meals, and he is mostly doing correction boluses, does not consistently bolus for meals.  Even when he enters carbs in the pump, which is not very frequently, he enters a small amount.  He did not have the appointment with the nutritionist as recommended at last visit.   -At this visit, upon reviewing his pump settings, he did not make any of the changes that I recommended at last visit.  Therefore, for the last 4 months he continued to have very high blood sugars and did not make any adjustments in his pump settings in response to this.  We discussed again that if he is not entering carbs and bolus for his meals at the beginning of the meal, he would not be able to gain control of his diabetes.  Also, at today's visit, I made suggestions about increasing his basal rates and lowering his insulin to carb ratio and I helped him enter these into the pump during the time of the visit. -He is very busy and does not have consistent mealtimes and until he takes  time to eat and bolus for here is meals, we will not be able to gain control of his diabetes.  I explained that this will not be corrected even if he is able to get the OmniPod 5 pump, as he desires. -I also encouraged him to get the appointment with the nutritionist. -I suggested to: Patient Instructions  Please use the following pump settings: - basal rates: 12 am: 0.8 5 am: 0.8  3 pm: 0.9 6 pm: 1.1 - ICR  12 am: 1:7 (can change to 1:6 if sugars are still high after meals)  11 am: 1:7  (can change to 1:6 if sugars are still high after meals) - target: 110-120 - ISF: 12 am: 50 8 pm: 70 - active insulin time: 4h  Please schedule an appt with Antonieta Iba with nutrition.  Please continue testosterone 0.5 ml weekly.  Please stop at the lab.  Please return in 3-4 months.  - we checked his HbA1c: 8.9% (better, but not correlating with sugars at home) - advised to check sugars at different times of the day - 4x a day, rotating check times - advised for yearly eye exams >>  he is UTD - will check a lipid panel and ACR today - return to clinic in 3-4 months  2. Hashimoto's thyroiditis - latest thyroid labs reviewed with pt. >> normal: Lab Results  Component Value Date   TSH 3.660 07/21/2020  - he continues on LT4 75 mcg daily - pt feels good on this dose. - we discussed about taking the thyroid hormone every day, with water, >30 minutes before breakfast, separated by >4 hours from acid reflux medications, calcium, iron, multivitamins. Pt. is taking it correctly.  3.  Hypogonadotropic hypogonadism -Found during investigation for low libido.  The total testosterone was normal while SHBG was high and free testosterone was low.  Further investigation pointed towards mild hypogonadotropic hypogonadism.  A pituitary MRI was negative for tumor. He reported previous testicular surgery and a possible leak in the perineum, and since he also had problems initiating urine stream (possible BPH),  I referred him to urology.  He had a benign evaluation and was cleared for testosterone treatment.  PSA and hematocrit levels were normal before starting testosterone.  Recent starting testosterone in 01/2019: 50 mg every 14 days.  However, his testosterone level was, so I advised him to increase the dose to 50 mg every 7 days.  He misunderstood instructions and was taking 100 mg (0.5 mL of 200 mg/mL) every 2 weeks.  I advised him to take 0.25 mL every week (50 mg).  At last visit, he was still taking 0.5 mL every 2 weeks and mentioned that this was working better for him.  We continued the same dose.  -Overall, he felt better after starting testosterone improved libido and energy.  He then started to have hot flashes and he had a slightly low testosterone at his visit with PCP.  However, at that time, he could not remember exactly when the labs were drawn (whether they were drawn in the morning or halfway between injections).  Since last visit, he finally moved the testosterone to every week (0.25 mL, 50 mg) and he feels that this helped a lot. -At last visit, his total testosterone and a calculated free testosterone level was normal -CBC was normal in 06/2020 -At today's visit, he is fasting (he did have coffee with creamer, though), we will recheck his testosterone level -I encouraged him to have annual DRE's with PCP or urologist  4. Low IGF1 -He had a low IGF-I.  Pituitary work-up with a pituitary MRI did not not reveal any tumor.  -This is most likely related to hypogonadism and hyperglycemia/uncontrolled diabetes -We will continue to follow him expectantly  Orders Placed This Encounter  Procedures   Testosterone Free with SHBG   Lipid panel   Microalbumin / creatinine urine ratio   POCT glycosylated hemoglobin (Hb A1C)   Total time spent for the visit: 40 min, in reviewing his pump downloads, discussing his hypo- and hyper-glycemic episodes, reviewing previous labs and pump settings and  developing a plan to avoid hypo- and hyper-glycemia.  We also addressed the rest of his endocrine problems.  Component     Latest Ref Rng & Units 11/02/2020  Cholesterol     0 - 200 mg/dL 163  Triglycerides     0.0 - 149.0 mg/dL 46.0  HDL Cholesterol     >39.00 mg/dL 47.70  VLDL     0.0 - 40.0 mg/dL 9.2  LDL (calc)     0 - 99 mg/dL 106 (H)  Total CHOL/HDL Ratio      3  NonHDL      114.93  Testosterone, Serum (Total)     ng/dL 670  % Free Testosterone     % 0.6  Free Testosterone, S     pg/mL 40 (L)  Sex Hormone Binding Globulin     nmol/L 75.3 (H)  Microalb, Ur     0.0 - 1.9 mg/dL <0.7  Creatinine,U     mg/dL 55.9  MICROALB/CREAT RATIO     0.0 - 30.0 mg/g 1.3   Labs are normal with the exception of a slightly high LDL.  I will advise him about possibly starting a statin. Total testosterone and SHBG are normal.  The free testosterone level calculated with the Omni equation is actually normal, at 83 pg/mL.  This is a more accurate evaluation of the free testosterone level than the directly measured level.  No changes needed in his testosterone supplement.  Philemon Kingdom, MD PhD [facility] Endocrinology

## 2020-11-02 NOTE — Patient Instructions (Addendum)
Please  Increase: - basal rates: 12 am: 0.7 >> 0.85 5 am: 0.8  1 pm: 0.8 >> 1.0 6 pm: 1.0 >> 1.2 - ICR  12 am: 1:7 (can change to 1:6 if sugars are still high after meals)  11 am: 1:7  (can change to 1:6 if sugars are still high after meals) - target: 110-120 - ISF: 12 am: 50 8 pm: 70 - active insulin time: 4h  Please schedule an appt with Antonieta Iba with nutrition.  If you eat a low carb meal, Try to enter 50% of protein as carbs.  Please continue testosterone 0.25 ml weekly.  Please stop at the lab.  Please return in 3-4 months.

## 2020-11-04 ENCOUNTER — Telehealth: Payer: Self-pay | Admitting: Nutrition

## 2020-11-04 NOTE — Telephone Encounter (Signed)
Spoke with patient and he is able to get his pods/pdm from his local [facility], but will not fill until 8/27 due to him needing a 1 month supply of old pods. I explained what he will be getting and how to do the training on line, and set up the account that will link him to this practice.  He reported good understanding of this, and was appreciative of all of our assistance.

## 2020-11-04 NOTE — Telephone Encounter (Signed)
No  He is all set...finally

## 2020-11-04 NOTE — Telephone Encounter (Signed)
Daniel Hill,  Thank you!  Do I need to do anything else? C

## 2020-11-04 NOTE — Telephone Encounter (Signed)
Pt contacted by Diabetic educator Christean Grief via phone call.

## 2020-11-08 ENCOUNTER — Encounter: Payer: Self-pay | Admitting: Internal Medicine

## 2020-11-08 LAB — TESTOSTERONE, FREE AND TOTAL (INCLUDES SHBG)-(MALES)
% Free Testosterone: 0.6 %
Free Testosterone, S: 40 pg/mL — ABNORMAL LOW
Sex Hormone Binding Globulin: 75.3 nmol/L — ABNORMAL HIGH
Testosterone, Serum (Total): 670 ng/dL

## 2020-11-09 ENCOUNTER — Encounter: Payer: Self-pay | Admitting: Internal Medicine

## 2020-12-03 ENCOUNTER — Encounter: Payer: Self-pay | Admitting: Internal Medicine

## 2020-12-07 ENCOUNTER — Other Ambulatory Visit: Payer: Self-pay | Admitting: Internal Medicine

## 2020-12-07 ENCOUNTER — Encounter: Payer: Self-pay | Admitting: Internal Medicine

## 2020-12-07 MED ORDER — OMNIPOD 5 DEXG7G6 INTRO GEN 5 KIT: 1.0000 | PACK | 0 refills | Status: DC | PRN

## 2020-12-08 ENCOUNTER — Telehealth: Payer: Self-pay | Admitting: Nutrition

## 2020-12-08 NOTE — Telephone Encounter (Signed)
Patient says this is his insurance that is the hold up.  He has to wait 3 months since getting his last pod order, which is 8/27.  They have the script for the controller, and will ship out at that time.   He will call me if there is any more delay.

## 2020-12-11 ENCOUNTER — Other Ambulatory Visit: Payer: Self-pay

## 2020-12-11 DIAGNOSIS — IMO0002 Reserved for concepts with insufficient information to code with codable children: Secondary | ICD-10-CM

## 2020-12-11 DIAGNOSIS — E10319 Type 1 diabetes mellitus with unspecified diabetic retinopathy without macular edema: Secondary | ICD-10-CM

## 2020-12-11 MED ORDER — OMNIPOD 5 DEXG7G6 INTRO GEN 5 KIT: 1.0000 | PACK | 0 refills | Status: DC | PRN

## 2020-12-17 ENCOUNTER — Telehealth: Payer: Self-pay | Admitting: Dietician

## 2020-12-17 NOTE — Telephone Encounter (Signed)
Hi Tim,  I could not see your renal labs.  Are there any specific diet guidelines that your nephrologist has recommended for you besides low sodium?  Phosphorous? Potassium restriction?  Below are a couple of recipe options that I found.  I have not tried these specifically but can be used as templates. JUST Egg Roasted Veggie Frittata - Easy Smart Vegan - this can be very flexible and use a variety of vegetables.  I would bake the frittata in a pie plate or 9x9 pan sprayed with non-stick spray. The Best Freezer Vegan Breakfast Burrito Recipe - Emilie Eats - this also can be very flexible.  I make mine with fat free refried beans then the tofu scramble, salsa, and sometimes leftover roasted potatoes or sweet potatoes)  Other breakfast ideas: 2 slices Ezekiel bread, almond or peanut butter, low glycemic fruit Steal cut oats (prepare ahead), walnuts or almonds, low glycemic fruit Protein shake:   banana, 1 cup berries, almond milk (unsweetened), plant based protein such as Orgain Simply plant based  Ezekiel bread can be found in the freezer section of [employer] and [employer].  Other groceries may have it but things have changed in the past 2 years.  It is a very dense bread which is best toasted. Just Eggs can be found at [employer] or ask at a health food store near you.  It is a refrigerated product.  I have used this product and find it very good.  Book ideas:  Mastering Diabetes: The Revolutionary Method to Reverse Insulin Resistance Permanently in Type 1, Type 1.5, Type 2, Prediabetes, and Gestational Diabetes Hardcover - Illustrated, June 12, 2018 by Jenny Reichmann PhD (Author), Hildred Laser MPH  (Author), Alyssa Grove MD (Foreword) - Written by 2 young men with Type 1 Diabetes.   The Engine 2 Cookbook: More than 130 Lip-Smacking, Rib-Sticking, Body-Slimming Recipes to Live Plant-Strong by Christa See and Nicola Girt   Apr 30, 2019   Breakfast is a great place to start!   Plant based eating can be beneficial to improve your blood sugar and whole body health as well as protect your kidney function and lower urinary protein excretion. Start small and increase as you build on ideas. Once client recently mixed ground [place] and cooked lentils (half and half) to make a burger and thought it was quite good.  We can talk about overall choices more during your appointment. I have also attached a low glycemic food list. As your body becomes more sensitive to insulin, your blood sugar can improve.  Please let me know if you have questions. I look forward to seeing you 02/12/2021.  Antonieta Iba, RD, [facility] [address]  [place],  [address] [phone]

## 2020-12-17 NOTE — Telephone Encounter (Signed)
Patient called to make and appointment for uncontrolled diabetes post kidney/pancrease transplant.  He would like to learn more about nutrition to improve his control. Previous nutrition appointments were missed.  First available appointment is October 21.  Patient would like more information to help him with ideas for breakfast in particular.  He would like to stop  processed meat and find healthier alternatives to eggs. He currently uses an Omnipod DASH and is on the list for the Omnipod 5.  Emailed him links to a couple of recipes: "Just Egg" Frittata and vegan breakfast burrito.  Discussed steal cut oats with berries and walnuts or almonds and a vegan protein shake option with berries as well as Ezekiel bread, natural peanut butter and a low glycemic fruit. Patient states that he needs to lose weight.  Discussed need to improve his insulin sensitivity as well. Will also send a couple of cookbook options.  Renal labs not available.  Patient trying to follow a low sodium diet with other guidelines to help control his diabetes.   Antonieta Iba, RD, LDN, CDCES

## 2020-12-29 ENCOUNTER — Encounter: Payer: Self-pay | Admitting: Internal Medicine

## 2020-12-31 NOTE — Telephone Encounter (Signed)
Na

## 2021-01-05 ENCOUNTER — Telehealth: Payer: Self-pay | Admitting: Nutrition

## 2021-01-05 NOTE — Telephone Encounter (Signed)
Patient was told that he does not need a trainer, if he watches the videos.  The upgrade is designed so that the patient can do this himself.  He reports that he has already watched the videos and knows how to do everything.  He promised to call me if he has any difficulty with this.

## 2021-01-14 DIAGNOSIS — G4733 Obstructive sleep apnea (adult) (pediatric): Secondary | ICD-10-CM

## 2021-01-25 ENCOUNTER — Other Ambulatory Visit: Payer: Self-pay | Admitting: Internal Medicine

## 2021-01-27 ENCOUNTER — Encounter: Payer: Self-pay | Admitting: Internal Medicine

## 2021-02-12 ENCOUNTER — Encounter: Payer: BC Managed Care – PPO | Attending: Internal Medicine | Admitting: Dietician

## 2021-02-12 ENCOUNTER — Encounter: Payer: Self-pay | Admitting: Dietician

## 2021-02-12 ENCOUNTER — Other Ambulatory Visit: Payer: Self-pay

## 2021-02-12 DIAGNOSIS — E10319 Type 1 diabetes mellitus with unspecified diabetic retinopathy without macular edema: Secondary | ICD-10-CM | POA: Diagnosis not present

## 2021-02-12 NOTE — Patient Instructions (Signed)
Count carbohydrates with each meal and snack. Begin by entering a more accurate carbohydrate amount in your pump before the meal or snack.

## 2021-02-12 NOTE — Progress Notes (Signed)
  Medical Nutrition Therapy  Appointment Start time:  [id number]  Appointment End time:  [id number] Patient is here today alone.  Primary concerns today: Patient would like to improve his blood glucose control.  He states that he does not enter the proper amount of carbohydrates into his pump as he is concerned about hypoglycemia particularly while driving or at night.   He has experienced lows at night since having healthier and smaller meals for dinner.  He reports history of gastroparesis prior to transplants. He would like to understand dosing carbohydrates for protein and fat. He states that his blood glucose rises about 40 points after waking prior to food and drinks.   He has been using the Omnipod 5 for about a month. Referral diagnosis: Type 1 diabetes, carbohydrate counting. Preferred learning style: no preference indicated Learning readiness: ready   NUTRITION ASSESSMENT   Anthropometrics  69" 211 lbs 02/12/2021 stable from July 2022  214 lbs 07/16/2020 Wishes to lose weight.  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 Medications: see list to include Fiasp via Omnipod 5, synthroid Dexcom G6 :  Average glucose 197, 49% time in range Labs: see list to include:  A1C 8.9% 11/02/2020, Potassium 5.2, BUN 121, Creatinine 1.23, GFR 73 Notable Signs/Symptoms: uncontrolled blood glucose  Lifestyle & Dietary Hx Patient lives with his wife and 2 daughters. 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  He has a decree in Education officer, museum and counseling.  Estimated daily fluid intake: "clear Splenda" and needs to drink more water Supplements: vitamin D, MVI Sleep:  Stress / self-care: stress due to hypoglycemia and other Current average weekly  physical activity: walks some at work  24-Hr Dietary Recall First Meal: Great Value Breakfast Bowl  Snack: none Second Meal: Fast food hamburger and small fries today (but rare)  often vegetables and meat from home or rare spaghetti. Snack: none Third Meal: meat and 2 vegetables or Dario Grilled chicken pita Snack: none Beverages: flavored water with splenda, water   NUTRITION DIAGNOSIS  NB-1.1 Food and nutrition-related knowledge deficit As related to balance of carbohydrates, protein, and fat.  As evidenced by diet hx and patient report.   NUTRITION INTERVENTION  Nutrition education (E-1) on the following topics:  Carbohydrate counting by portion sizes, resources Carbohydrate counting for protein and fat Meal choices for better diabetes control Fears of hypoglycemia Behavioral change/mindfulness Potassium and diet Added to the Type 1 Support group list  Handouts Provided Include  Meal plan card Snack list Potassium content of food (from [place] for Change Teaching method utilized: Visual & Auditory  Demonstrated degree of understanding via: Teach Back  Barriers to learning/adherence to lifestyle change: none  Goals Established by Pt Count carbohydrates with each meal and snack. Begin by entering a more accurate carbohydrate amount in your pump before the meal or snack.   MONITORING & EVALUATION Dietary intake, weekly physical activity prn.  Next Steps  Patient is to call or My chart for any questions.

## 2021-02-19 ENCOUNTER — Encounter: Payer: Self-pay | Admitting: Internal Medicine

## 2021-02-19 MED ORDER — FIASP 100 UNIT/ML IJ SOLN: INTRAMUSCULAR | 1 refills | Status: DC

## 2021-03-01 ENCOUNTER — Encounter: Payer: Self-pay | Admitting: Internal Medicine

## 2021-03-15 ENCOUNTER — Ambulatory Visit: Payer: BC Managed Care – PPO | Admitting: Internal Medicine

## 2021-03-15 NOTE — Progress Notes (Deleted)
Patient ID: Daniel Hill, male   DOB: 06/24/1975, 45 y.o.   MRN: [id number]   This visit occurred during the SARS-CoV-2 public health emergency.  Safety protocols were in place, including screening questions prior to the visit, additional usage of staff PPE, and extensive cleaning of exam room while observing appropriate contact time as indicated for disinfecting solutions.   HPI: Daniel Hill is a 45 y.o.-year-old male, initially referred by his nephrologist, Dr. Jimmy Footman, presenting for follow-up for DM1, uncontrolled, with complications (DKA, mild gastroparesis, proliferative retinopathy, ESRD -history of being on HD and peritoneal dialysis for 5 years, PN). Last visit 4 months ago.  Interim history: He continues to be extremely busy at work (works at [employer]) due to the treatment of his diabetes. Since last visit, he had chest wall pain 01/2021.  He was referred to neurology and cardiology. No increased urination, blurry vision, nausea, chest pain.  DM1: Reviewed history: Patient has been diagnosed with diabetes at 45 y/o. She was prev. Seen by [facility] endocrinology.  She is status post kidney-pancreatic transplant in 2003, but his pancreas transplant failed in 2013.  He is considered too high risk for a new pancreatic transplant.  His retinopathy and gastroparesis improved after transplant.  He had several eye surgeries (vitrectomy and scleral buckle procedures).  He has chronic blindness in his left eye.  Reviewed HbA1c levels: Lab Results  Component Value Date   HGBA1C 8.9 (A) 11/02/2020   HGBA1C 10.1 (H) 07/21/2020   HGBA1C 9.8 (A) 07/16/2020  01/01/2020: HbA1c 9.1% 07/15/2016: HbA1c 7.8%  He was on the previous regimen: - Tresiba 10 units at bedtime - Humalog - ICR 1:15, target 120, ISF 40 - injects after meals (ends up with 2-6 units per meal)  Now on an insulin pump:  -Omnipod DASH  Pump, but would want to switch to OmniPod 5 - per insurance, he can only start this after  03/21/2021.  CGM: -Prev. On freestyle libre 2, but had problems with the sensor coming off -Initially Dexcom CGM not covered, but now on Dexcom G6  Insulin: -Previously NovoLog in the pump -Now Fiasp  Pump settings: - basal rates: 12 am: 0.8 5 am: 0.8  3 pm: 0.9 6 pm: 1.1 - ICR  12 am: 1:7 (can change to 1:6 if sugars are still high after meals)  11 am: 1:7  (can change to 1:6 if sugars are still high after meals) - target: 110-120 - ISF: 12 am: 50 8 pm: 70 - active insulin time: 4h TDD from basal insulin: ~11 >> 17-19 units/day >> 57% >> 47% (18.5 units) TDD from bolus insulin: ~21 units >> 4-14 units/day >> 43% >> 53% Total daily dose 35-60 units a day    He checks his blood sugars more than 4 times a day with his Dexcom CGM:  Previously:   Previously:   Lowest sugar was 47, 52 >> 100 >> 48 this months; he has hypoglycemia awareness in the 60s.  No previous hypoglycemia admission.  He has an unexpired glucagon kit at home. Highest sugar was 400s >> 500.  He has a distant history of DKA admission-before his renal transplant.  Pt's meals are: - Breakfast: eggs or oatmeal - Lunch: meat and veggies, sandwich, soup - Dinner: mostly meat and veggies, sometimes pasta and bread - Snacks: 2  - usually apples or beef jerky.  -He has a history of ESRD, now status post renal transplant- Dr. Jimmy Footman >> Dr Posey Pronto, last BUN/creatinine:  01/05/2021: BUN/creatinine  21/1.23, glucose 128 06/23/2020: Glucose 371, BUN/creatinine 20/1.19, GFR 77 03/23/2020: Glucose 266, BUN/creatinine 16/0.87, GFR 104 01/01/2020: BUN/creatinine 70/1.13, glucose 136 10/27/2019: Glucose 163, BUN/creatinine 15/1.10, GFR 81 Lab Results  Component Value Date   BUN 13 07/28/2018   BUN 16 05/02/2018   CREATININE 0.95 07/28/2018   CREATININE 0.97 05/02/2018  02/2018: Cr 0.8  -+ HL; last set of lipids: 07/31/2019: 162/94/42/102 Lab Results  Component Value Date   CHOL 163 11/02/2020   HDL 47.70  11/02/2020   LDLCALC 106 (H) 11/02/2020   TRIG 46.0 11/02/2020   CHOLHDL 3 11/02/2020  He takes fish oil.  - last eye exam was in 2020: + DR; he is seen at [facility].  -He does have numbness and tingling in his feet.   B12 levels:  01/05/2021: 853 01/01/2020: 381  He was previously on Reglan and he has intolerance to this.  Pt has FH of DM in father.  Hashimoto's thyroiditis:  Pt is on levothyroxine 75 mcg daily, taken: - in am - fasting - at least 30 min from b'fast - no calcium - no iron - no multivitamins - no PPIs - not on Biotin  TSH levels were normal: 01/05/2021: TSH 2.11, normal Lab Results  Component Value Date   TSH 3.660 07/21/2020  06/23/2020: TSH 1.392 01/01/2020: TSH 2.502  Pt denies: - feeling nodules in neck - hoarseness - dysphagia - choking - SOB with lying down  He does have a family history of thyroid cancer.  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  Previously taking 0.5 mL every 2 weeks, now 0.25 mL every week.  He has much better.  Reviewed history:  He had a normal total testosterone with a low free testosterone.   No signs of hemochromatosis.   His HIV test was nonreactive.   He had a normal PSA, normal ECG and prolactin, normal cortisol.   He had a low IGF-I, also.  Pertinent labs were reviewed: 01/05/2021: CBC: Normal  Component     Latest Ref Rng & Units 11/02/2020  Testosterone, Serum (Total)     ng/dL 670  % Free Testosterone     % 0.6  Free Testosterone, S     pg/mL 40 (L)  Sex Hormone Binding Globulin     nmol/L 75.3 (H)  Total testosterone and SHBG are normal.  The free testosterone level calculated with the Omni equation is actually normal, at 83 pg/mL.  This is a more accurate evaluation of the free testosterone level than the directly measured level.  No changes needed in his testosterone supplement.  Previously: Component     Latest Ref Rng & Units  07/21/2020  Testosterone, Serum (Total)     ng/dL 597  % Free Testosterone     % 0.5  Free Testosterone, S     pg/mL 30 (L)  Sex Hormone Binding Globulin     nmol/L 93.5 (H)  Hemoglobin A1C     4.8 - 5.6 % 10.1 (H)  Est. average glucose Bld gHb Est-mCnc     mg/dL 243  T4,Free(Direct)     0.82 - 1.77 ng/dL 1.21  TSH     0.450 - 4.500 uIU/mL 3.660  Insulin-Like GF-1     84 - 270 ng/mL 54 (L)  Prostate Specific Ag, Serum     0.0 - 4.0 ng/mL 0.8  IGF-I remained slightly low, possibly related to his diabetes control.  Total testosterone is normal while SHBG is high >>  the calculated free testosterone level is actually normal, at 60 ng/mL.    Previously: 01/01/2020: Free testosterone 3.22 (4.26-16.1), Hb/HT 14.4/43.4 at last check by PCP  Component     Latest Ref Rng & Units 04/08/2019  Testosterone, Serum (Total)     ng/dL 531  % Free Testosterone     % 0.4  Free Testosterone, S     pg/mL 21 (L)  Sex Hormone Binding Globulin     nmol/L 110.0 (H)  TIBC     250 - 450 ug/dL 251  UIBC     111 - 343 ug/dL 173  Iron     38 - 169 ug/dL 78  Iron Saturation     15 - 55 % 31  LH     1.7 - 8.6 mIU/mL 3.1   Component     Latest Ref Rng & Units 12/28/2018 02/01/2019  Testosterone, Serum (Total)     ng/dL  628  % Free Testosterone     %  0.4  Free Testosterone, S     pg/mL  25 (L)  Sex Hormone Binding Globulin     nmol/L  119.1 (H)  Iron     42 - 165 ug/dL 65   Transferrin     212.0 - 360.0 mg/dL 191.0 (L)   Saturation Ratios     20.0 - 50.0 % 24.3   IGF-I, LC/MS     52 - 328 ng/mL 32 (L)   Z-Score (Male)     -2.0 - 2 SD -2.8 (L)   HIV     NON-REACTI NON-REACTIVE   PSA     0.10 - 4.00 ng/mL 0.64   LH     1.7 - 8.6 mIU/mL  4.9   Component     Latest Ref Rng & Units 11/09/2018 12/12/2018  Testosterone, Serum (Total)     ng/dL 494 670  % Free Testosterone     % 0.5 0.5  Free Testosterone, S     pg/mL 25 (L) 34 (L)  Sex Hormone Binding Globulin     nmol/L 111.0 (H)  136.5 (H)  Estradiol     pg/mL  29  Free Estradiol, Percent     %  1.3  Free Estradiol, Serum     pg/mL  0.38  hCG Quant     0 - 3 mIU/mL  <1  Prolactin     4.0 - 15.2 ng/mL  8.0  LH     1.7 - 8.6 mIU/mL  4.9  FSH     1.5 - 12.4 mIU/mL  3.6  Insulin-Like GF-1     84 - 270 ng/mL  43 (L)  Prostate Specific Ag, Serum     0.0 - 4.0 ng/mL  0.8  Cortisol     ug/dL  10.7   Lab Results  Component Value Date   PSA 0.64 12/28/2018   Pituitary MRI (01/18/2019): Normal pituitary MRI.  No evidence of adenoma or other abnormality.  We started him on testosterone: 01/2019: 50 mg every 2 weeks   03/2019: Testosterone level was low >> I advised him to increase the dose to 50 mg every week. However, he is was taking 100 mg (0.5 mL) every 2 weeks. 06/2019: I recommended testosterone to 50 mg (0.25 mL) every week >> however, he continued 0.5 mL every 2 weeks  06/2020: He finally started 0.25 ml q week >> feels much better  Reviewed pertinent history:  He remembered being found to have a low testosterone  approximately 10 years ago.  He was tried on testosterone gel and then, but came off afterwards.  He then saw Dr. Forde Dandy who also checked the testosterone and patient was told that this was normal.    His wife got pregnant after fertility tx at [facility]. Pt's sperm count was reportedly normal, but on a lower side. He admits for decreased libido Has no  difficulty obtaining but sometimes has problems maintaining an erection No trauma to testes, testicular irradiation but had surgery for L hydrocele.  He mentions that after the surgery he developed a perineal leak (between scrotum and the rectum). He also has problems initiating urine flow  No h/o of mumps orchitis/h/o autoimmune ds. No h/o cryptorchidism He grew and went through puberty after his peers - college + mild shrinking of testes. No very small testes (<5 ml).  No incomplete/delayed sexual development     No breast discomfort/+  gynecomastia (since he was on dialysis).    No loss of body hair (axillary/pubic)/decreased need for shaving + height loss - 2 inches Lately + abnormal sense of smell  + hot flushes when his blood sugars are low No vision problems No worst HA of his life No FH of hypogonadism/infertility  No personal h/o infertility - has children No FH of hemochromatosis or pituitary tumors No excessive weight gain or loss.  No chronic pain. Not on opiates, but Tramadol bid >> now off. He does not take steroids.  No alcohol No anabolic steroids use No herbal medicines + antidepressants: Lexapro No AI ds in his family.  He sees urology.  Dr. Kathlen Mody. He has OSA-on CPAP He had a rectal fistula surgery at the beginning of 2021. He takes a vitamin D supplement. Latest B12 vitamin was on 06/23/2020: Normal, at 670  ROS: + see HPI Neurological: + tremors (chronic, from Prograft)/+ numbness/+ tingling/no dizziness  I reviewed pt's medications, allergies, PMH, social hx, family hx, and changes were documented in the history of present illness. Otherwise, unchanged from my initial visit note.  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     Continuous Blood Gluc Sensor (DEXCOM G6 SENSOR) MISC APPLY SENSOR AS DIRECTED TO BODY FOR CONTINUOUS GLUCOSE MONITORING. CHANGE SENSOR EVERY 10 DAYS. 9 each 3   Continuous Blood Gluc Transmit (DEXCOM G6 TRANSMITTER) MISC 1 Device by Does not apply route every 3 (three) months. 1 each 3   escitalopram (LEXAPRO) 10 MG tablet Take 10 mg by mouth 2 (two) times daily.  Glucagon (BAQSIMI TWO PACK) 3 MG/DOSE POWD Place 3 mg into the nose once as needed for up to 1 dose. 1 each 11   Insulin Aspart, w/Niacinamide, (FIASP) 100 UNIT/ML SOLN Use up to 50 units via pump daily. 40 mL 1   Insulin Disposable Pump (OMNIPOD 5 G6 INTRO, GEN 5,) KIT 1 each by Does not apply route as needed. 1 kit 0   Insulin Disposable Pump (OMNIPOD 5 G6 POD, GEN 5,) MISC 1 each by Does not apply route every 3 (three) days. 30 each 3   levothyroxine (SYNTHROID) 75 MCG tablet TAKE 1 TABLET(75 MCG) BY MOUTH DAILY 90 tablet 3   Multiple Vitamins-Minerals (MULTIVITAMINS THER. W/MINERALS) TABS Take 1 tablet by mouth daily.     ondansetron (ZOFRAN) 4 MG tablet Take 1 tablet (4 mg total) by mouth every 8 (eight) hours as needed for nausea or vomiting. (Patient not taking: Reported on 02/12/2021) 15 tablet 0   SYRINGE/NEEDLE, DISP, 1 ML 23G X 1" 1 ML MISC Use every week 50 each 1   tacrolimus (PROGRAF) 1 MG capsule Take 3 mg by mouth 2 (two) times daily.     testosterone cypionate (DEPOTESTOSTERONE CYPIONATE) 200 MG/ML injection Inject 0.25 mLs (50 mg total) into the muscle once a week. 3 mL 3   traMADol (ULTRAM) 50 MG tablet Take 50-100 mg by mouth every 6 (six) hours as needed for pain. (Patient not taking: No sig reported)     TUBERCULIN SYR 1CC/25GX5/8" (B-D TB SYRINGE 1CC/25GX5/8") 25G X 5/8" 1 ML MISC USE 1 EVERY 2 WEEKS 8 each 12   Vitamin D, Ergocalciferol, (DRISDOL) 1.25 MG (50000 UT) CAPS capsule Take 50,000 Units by mouth every Saturday.  (Patient not taking:  Reported on 02/12/2021)     No current facility-administered medications on file prior to visit.    Allergies  Allergen Reactions   Tomato Nausea And Vomiting    Immediate vomiting    Other Nausea And Vomiting    RAW Canteloupe, banana, tomatoes, all raw vegetables  Unless the vegetables have dressing on them.  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   Codeine Nausea And Vomiting   Metoclopramide Hcl Nausea And Vomiting   Family History  Problem Relation Age of Onset   Stroke Mother    Lymphoma Father    Diabetes type II Father    PE: There were no vitals taken for this visit. Wt Readings from Last 3 Encounters:  02/12/21 211 lb (95.7 kg)  11/02/20 211 lb 9.6 oz (96 kg)  07/16/20 214 lb 3.2 oz (97.2 kg)   Constitutional: overweight, in NAD Eyes: PERRLA, EOMI, no exophthalmos ENT: moist mucous membranes, no thyromegaly, no cervical lymphadenopathy Cardiovascular: RRR, No MRG Respiratory: CTA B Gastrointestinal: abdomen soft, NT, ND, BS+ Musculoskeletal: no deformities, strength intact in all 4 Skin: moist, warm, no rashes Neurological: + tremor with outstretched hands, DTR normal in all 4  ASSESSMENT: 1. DM1, uncontrolled, without complications - h/o DKA - mild gastroparesis - proliferative retinopathy - ESRD -history of being on HD and peritoneal dialysis for 5 years - PN  2. Hashimoto's thyroiditis  3.  Hypogonadotropic  4. Low IGF1  PLAN:  1. Patient with   longstanding, uncontrolled, type 1 diabetes, on OmniPod Dash insulin pump.  She had a freestyle libre CGM at the [facility] CGM was not initially covered for him but then was able to switch to the Dexcom G6 sensor.  Since last visit we tried very hard to obtain him the  OmniPod 5 insulin pump but this was initially not approved by his insurance.  After back-and-forth with the insurance and the OmniPod rep, we found out that this may be covered starting this month.  We will send a new prescription to  his pharmacy.  It is important for him to obtain this since this is the only version of OmniPod that he is able to integrate with the sensor in a closed-loop fashion. -At last visit, sugars are very high throughout the day and they were starting to increase after breakfast.  They continued to increase in a stepwise fashion throughout the day with the largest increase after dinner.  The sugars were otherwise extremely fluctuating especially after dinner with the majority of them approximately in the 300s.  Upon questioning and review of his pump downloads, he was not introducing enough carbs into the pump consistently.  He had entire days in which he did not introduce any carbs at all and other days in which he only introduced 15 to 20 units with a meal.  I advised him that this was not conducive to good control.  I referred him to nutrition but he did not have this appointment yet.  He is very busy at work.  We discussed about rotating the infusion site as he felt that some of his blood sugars were high due to scar tissue.  He also needed help with organizing his meals and I also recommended to try to start exercising.  CGM interpretation: -At today's visit, we reviewed his CGM downloads: It appears that *** of values are in target range (goal >70%), while *** are higher than 180 (goal <25%), and *** are lower than 70 (goal <4%).  The calculated average blood sugar is ***.  The projected HbA1c for the next 3 months (GMI) is ***. -Reviewing the CGM trends, ***  -Reviewing the CGM trends, sugars are still extremely high, even higher than before, at all times of the day.  He only touches the normal interval approximately at 1 PM, but otherwise the sugars increase in a stepwise fashion throughout the day and start decreasing around 4 AM.  Upon questioning, he is still not bolusing before meals, and he is mostly doing correction boluses, does not consistently bolus for meals.  Even when he enters carbs in the  pump, which is not very frequently, he enters a small amount.  He did not have the appointment with the nutritionist as recommended at last visit.   -At this visit, upon reviewing his pump settings, he did not make any of the changes that I recommended at last visit.  Therefore, for the last 4 months he continued to have very high blood sugars and did not make any adjustments in his pump settings in response to this.  We discussed again that if he is not entering carbs and bolus for his meals at the beginning of the meal, he would not be able to gain control of his diabetes.  Also, at today's visit, I made suggestions about increasing his basal rates and lowering his insulin to carb ratio and I helped him enter these into the pump during the time of the visit. -He is very busy and does not have consistent mealtimes and until he takes time to eat and bolus for here is meals, we will not be able to gain control of his diabetes.  I explained that this will not be corrected even if he is able to get the OmniPod 5 pump, as  he desires. -I also encouraged him to get the appointment with the nutritionist.  -I suggested to: Patient Instructions  Please use the following pump settings: - basal rates: 12 am: 0.8 5 am: 0.8  3 pm: 0.9 6 pm: 1.1 - ICR  12 am: 1:7 (can change to 1:6 if sugars are still high after meals)  11 am: 1:7  (can change to 1:6 if sugars are still high after meals) - target: 110-120 - ISF: 12 am: 50 8 pm: 70 - active insulin time: 4h  Please continue testosterone 0.5 mg (0.25 mL) weekly.  Please return in 3-4 months.  - we checked his HbA1c: 7%  - advised to check sugars at different times of the day - 4x a day, rotating check times - advised for yearly eye exams >> he is UTD - return to clinic in 3-4 months  2. Hashimoto's thyroiditis - latest thyroid labs reviewed with pt. >> normal: Lab Results  Component Value Date   TSH 3.660 07/21/2020  - he continues on LT4 75 mcg  daily - pt feels good on this dose. - we discussed about taking the thyroid hormone every day, with water, >30 minutes before breakfast, separated by >4 hours from acid reflux medications, calcium, iron, multivitamins. Pt. is taking it correctly.  3.  Hypogonadotropic hypogonadism -Found during investigation for low libido.  The total testosterone was normal while SHBG was high and free testosterone was low.  Further investigation pointed towards mild hypogonadotropic hypogonadism.  A pituitary MRI was negative for tumor. He reported previous testicular surgery and a possible leak in the perineum, and since he also had problems initiating urine stream (possible BPH), I referred him to urology.  He had a benign evaluation and was cleared for testosterone treatment.  PSA and hematocrit levels were normal before starting testosterone.  Recent starting testosterone in 01/2019: 50 mg every 14 days.  However, his testosterone level was, so I advised him to increase the dose to 50 mg every 7 days.  He misunderstood instructions and was taking 100 mg (0.5 mL of 200 mg/mL) every 2 weeks.  I advised him to take 0.25 mL every week (50 mg).  At last visit, he was still taking 0.5 mL every 2 weeks and mentioned that this was working better for him.  We continued the same dose.  -Overall, he felt better after starting testosterone improved libido and energy.  He then started to have hot flashes and he had a slightly low testosterone at his visit with PCP.  However, at that time, he could not remember exactly when the labs were drawn (whether they were drawn in the morning or halfway between injections).  Before last visit, he finally moved the testosterone every week: 0.25 mL (50 mg) and he started to feel much better. -At last check, calculated free testosterone was normal -CBC and PSA was normal in 06/2020 -He has regular DRE's with urologist  4. Low IGF1 -He had a low IGF-I.  Pituitary work-up with a pituitary MRI  did not reveal any tumor -This is most likely related to hypogonadism and hyperglycemia/uncontrolled diabetes -We will continue to follow him expectantly  No orders of the defined types were placed in this encounter.   Philemon Kingdom, MD PhD [facility] Endocrinology

## 2021-04-29 ENCOUNTER — Other Ambulatory Visit: Payer: Self-pay | Admitting: Internal Medicine

## 2021-05-12 ENCOUNTER — Encounter: Payer: Self-pay | Admitting: Internal Medicine

## 2021-05-12 DIAGNOSIS — R7989 Other specified abnormal findings of blood chemistry: Secondary | ICD-10-CM

## 2021-05-13 MED ORDER — OMNIPOD 5 DEXG7G6 PODS GEN 5 MISC: 1.0000 | 3 refills | Status: DC

## 2021-05-13 NOTE — Telephone Encounter (Signed)
RX now sent in.

## 2021-05-22 ENCOUNTER — Encounter: Payer: Self-pay | Admitting: Internal Medicine

## 2021-06-07 ENCOUNTER — Other Ambulatory Visit (HOSPITAL_COMMUNITY): Payer: Self-pay

## 2021-06-09 ENCOUNTER — Other Ambulatory Visit: Payer: Self-pay

## 2021-06-09 ENCOUNTER — Encounter: Payer: Self-pay | Admitting: Internal Medicine

## 2021-06-09 ENCOUNTER — Ambulatory Visit: Payer: BC Managed Care – PPO | Admitting: Internal Medicine

## 2021-06-09 VITALS — BP 132/82 | HR 86 | Ht 69.0 in | Wt 212.6 lb

## 2021-06-09 DIAGNOSIS — E10319 Type 1 diabetes mellitus with unspecified diabetic retinopathy without macular edema: Secondary | ICD-10-CM | POA: Diagnosis not present

## 2021-06-09 DIAGNOSIS — E038 Other specified hypothyroidism: Secondary | ICD-10-CM

## 2021-06-09 DIAGNOSIS — E230 Hypopituitarism: Secondary | ICD-10-CM

## 2021-06-09 DIAGNOSIS — E063 Autoimmune thyroiditis: Secondary | ICD-10-CM | POA: Diagnosis not present

## 2021-06-09 LAB — POCT GLYCOSYLATED HEMOGLOBIN (HGB A1C): Hemoglobin A1C: 7.6 % — AB (ref 4.0–5.6)

## 2021-06-09 NOTE — Progress Notes (Signed)
Patient ID: Daniel Hill, male   DOB: August 18, 1975, 46 y.o.   MRN: [id number]   This visit occurred during the SARS-CoV-2 public health emergency.  Safety protocols were in place, including screening questions prior to the visit, additional usage of staff PPE, and extensive cleaning of exam room while observing appropriate contact time as indicated for disinfecting solutions.   HPI: Daniel Hill is a 46 y.o.-year-old male, initially referred by his nephrologist, Dr. Jimmy Footman, presenting for follow-up for DM1, uncontrolled, with complications (DKA, mild gastroparesis, proliferative retinopathy, ESRD -history of being on HD and peritoneal dialysis for 5 years, PN). Last visit 5 months ago.  Interim history: He continues to be extremely busy at work (works at [employer]). He cont. to have fatigue, which exacerbates if he delays his weekly testosterone injection. No increased urination, blurry vision, nausea, chest pain.  DM1: Reviewed history: Patient has been diagnosed with diabetes at 46 y/o. She was prev. Seen by [facility] endocrinology.  She is status post kidney-pancreatic transplant in 2003, but his pancreas transplant failed in 2013.  He is considered too high risk for a new pancreatic transplant.  His retinopathy and gastroparesis improved after transplant.  He had several eye surgeries (vitrectomy and scleral buckle procedures).  He has chronic blindness in his left eye.  Reviewed HbA1c levels: Lab Results  Component Value Date   HGBA1C 8.9 (A) 11/02/2020   HGBA1C 10.1 (H) 07/21/2020   HGBA1C 9.8 (A) 07/16/2020   HGBA1C 9.2 (A) 03/16/2020   HGBA1C 8.7 (A) 04/03/2019   HGBA1C 9.7 (A) 12/28/2018   HGBA1C 8.9 (A) 03/21/2018   HGBA1C 9.9 (H) 05/19/2011  01/01/2020: HbA1c 9.1% 07/15/2016: HbA1c 7.8%  He was on the previous regimen: - Tresiba 10 units at bedtime - Humalog - ICR 1:15, target 120, ISF 40 - injects after meals (ends up with 2-6 units per meal)  Now on an insulin pump:   -Omnipod DASH  Pump -started OmniPod 5 - started 12/2020  CGM: -Prev. On freestyle libre 2, but had problems with the sensor coming off -Initially Dexcom CGM not covered, but now on Dexcom G6  Insulin: -Previously NovoLog in the pump -Now Fiasp  Pump settings  - basal rates: 12 am: 0.8 >> 0.85 5 am: 0.8 >> 0.75 1 pm: 1 3 pm: 0.9 >> 1 6 pm: 1.1 >> 1.2 - ICR  12 am: 1:7 (can change to 1:6 if sugars are still high after meals) >> 8  11 am: 1:7  (can change to 1:6 if sugars are still high after meals) >> 8 - target: 110-120 >> 110-140 - ISF: 12 am: 50 8 pm: 70 - active insulin time: 4h TDD from basal insulin: ~11 >> 17-19 units/day >> 57% >> 47% (18.5 units) TDD from bolus insulin: ~21 units >> 4-14 units/day >> 43% >> 53% Total daily dose 35-60 units a day    He checks his blood sugars more than 4 times a day with his Dexcom CGM:   Previously:   Lowest sugar was 50s >> 47, 52 >> 100 >> 40 >> 300s; he has hypoglycemia awareness in the 60s.  No previous hypoglycemia admission.  He has an unexpired glucagon kit at home. Highest sugar was 400s >> 400s >> 500 >> 300s.  He has a distant history of DKA admission-before his renal transplant.  Pt's meals are: - Breakfast: eggs or oatmeal - Lunch: meat and veggies, sandwich, soup - Dinner: mostly meat and veggies, sometimes pasta and bread - Snacks: 2  -  usually apples or beef jerky.  -He has a history of ESRD, now status post renal transplant- Dr. Jimmy Footman >> Dr Posey Pronto, last BUN/creatinine:  01/05/2021: Sodium 135 - 146 MMOL/L 140   Potassium 3.5 - 5.3 MMOL/L 5.2   Comment: NO VISIBLE HEMOLYSIS  Chloride 98 - 110 MMOL/L 105   CO2 21 - 31 MMOL/L 27   BUN 8 - 24 MG/DL 21   Glucose 70 - 99 MG/DL 128 High    Creatinine 0.70 - 1.30 MG/DL 1.23   Calcium 8.5 - 10.5 MG/DL 9.9   Total Protein 6.4 - 8.9 G/DL 7.0   Albumin  3.5 - 5.7 G/DL 4.2   Total Bilirubin 0.0 - 1.0 MG/DL 0.5   Alkaline Phosphatase 34 - 104 IU/L or U/L 98    AST (SGOT) 13 - 39 IU/L or U/L 18   ALT (SGPT) 7 - 52 IU/L or U/L 15   Anion Gap 4 - 14 MMOL/L 8   Est. GFR >=60 ML/MIN/1.73 M*2  ML/MIN/1.73 M*2 73    06/23/2020: Glucose 371, BUN/creatinine 20/1.19, GFR 77 03/23/2020: Glucose 266, BUN/creatinine 16/0.87, GFR 104 01/01/2020: BUN/creatinine 70/1.13, glucose 136 10/27/2019: Glucose 163, BUN/creatinine 15/1.10, GFR 81 Lab Results  Component Value Date   BUN 13 07/28/2018   BUN 16 05/02/2018   CREATININE 0.95 07/28/2018   CREATININE 0.97 05/02/2018  02/2018: Cr 0.8  -+ HL; last set of lipids: Lab Results  Component Value Date   CHOL 163 11/02/2020   HDL 47.70 11/02/2020   LDLCALC 106 (H) 11/02/2020   TRIG 46.0 11/02/2020   CHOLHDL 3 11/02/2020  07/31/2019: 162/94/42/102 He takes fish oil.  - last eye exam was in 04/2021: + DR; he is seen at [facility]. Dr. Cordelia Pen.  -He does have numbness and tingling in his feet.  Latest B12 level: 01/01/2020: 381  He was previously on Reglan and he has intolerance to this.  Pt has FH of DM in father.  Hashimoto's thyroiditis:  Pt is on levothyroxine 75 mcg daily, taken: - in am - fasting - at least 30 min from b'fast - no calcium - no iron - no multivitamins - no PPIs - not on Biotin  TSH levels were normal: 01/05/2021: TSH 2.11 Lab Results  Component Value Date   TSH 3.660 07/21/2020  06/23/2020: TSH 1.392 01/01/2020: TSH 2.502  Pt denies: - feeling nodules in neck - hoarseness - dysphagia - choking - SOB with lying down  He does have a family history of thyroid cancer.  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  Previously taking 0.5 mL every 2 weeks, now 0.25 mL every week.  He feels much better.  Reviewed history:  He had a normal total testosterone with a low free testosterone.   No signs of hemochromatosis.   His HIV test was nonreactive.   He had a normal PSA, normal ECG and prolactin, normal cortisol.   He  had a low IGF-I, also.  Pertinent labs were reviewed: Component     Latest Ref Rng & Units 11/02/2020  Testosterone, Serum (Total)     ng/dL 670  % Free Testosterone     % 0.6  Free Testosterone, S     pg/mL 40 (L)  Sex Hormone Binding Globulin     nmol/L 75.3 (H)  Free testosterone calculated with the Omni equation, was normal, at 83.  01/05/2021: Hemoglobin 14.0 - 17.5 G/DL 14.3   Hematocrit 41.5 - 50.4 % 42.8  Component     Latest Ref Rng & Units 07/21/2020  Testosterone, Serum (Total)     ng/dL 597  % Free Testosterone     % 0.5  Free Testosterone, S     pg/mL 30 (L)  Sex Hormone Binding Globulin     nmol/L 93.5 (H)  Hemoglobin A1C     4.8 - 5.6 % 10.1 (H)  Est. average glucose Bld gHb Est-mCnc     mg/dL 243  T4,Free(Direct)     0.82 - 1.77 ng/dL 1.21  TSH     0.450 - 4.500 uIU/mL 3.660  Insulin-Like GF-1     84 - 270 ng/mL 54 (L)  Prostate Specific Ag, Serum     0.0 - 4.0 ng/mL 0.8   IGF-I remained slightly low, possibly related to his diabetes control.  Total testosterone is normal while SHBG is high >> the calculated free testosterone level = actually normal, at 60 ng/mL.    01/01/2020: Free testosterone 3.22 (4.26-16.1), Hb/HT 14.4/43.4 at check by PCP  Component     Latest Ref Rng & Units 04/08/2019  Testosterone, Serum (Total)     ng/dL 531  % Free Testosterone     % 0.4  Free Testosterone, S     pg/mL 21 (L)  Sex Hormone Binding Globulin     nmol/L 110.0 (H)  TIBC     250 - 450 ug/dL 251  UIBC     111 - 343 ug/dL 173  Iron     38 - 169 ug/dL 78  Iron Saturation     15 - 55 % 31  LH     1.7 - 8.6 mIU/mL 3.1   Component     Latest Ref Rng & Units 12/28/2018 02/01/2019  Testosterone, Serum (Total)     ng/dL  628  % Free Testosterone     %  0.4  Free Testosterone, S     pg/mL  25 (L)  Sex Hormone Binding Globulin     nmol/L  119.1 (H)  Iron     42 - 165 ug/dL 65   Transferrin     212.0 - 360.0 mg/dL 191.0 (L)   Saturation Ratios      20.0 - 50.0 % 24.3   IGF-I, LC/MS     52 - 328 ng/mL 32 (L)   Z-Score (Male)     -2.0 - 2 SD -2.8 (L)   HIV     NON-REACTI NON-REACTIVE   PSA     0.10 - 4.00 ng/mL 0.64   LH     1.7 - 8.6 mIU/mL  4.9   Component     Latest Ref Rng & Units 11/09/2018 12/12/2018  Testosterone, Serum (Total)     ng/dL 494 670  % Free Testosterone     % 0.5 0.5  Free Testosterone, S     pg/mL 25 (L) 34 (L)  Sex Hormone Binding Globulin     nmol/L 111.0 (H) 136.5 (H)  Estradiol     pg/mL  29  Free Estradiol, Percent     %  1.3  Free Estradiol, Serum     pg/mL  0.38  hCG Quant     0 - 3 mIU/mL  <1  Prolactin     4.0 - 15.2 ng/mL  8.0  LH     1.7 - 8.6 mIU/mL  4.9  FSH     1.5 - 12.4 mIU/mL  3.6  Insulin-Like GF-1     84 -  270 ng/mL  43 (L)  Prostate Specific Ag, Serum     0.0 - 4.0 ng/mL  0.8  Cortisol     ug/dL  10.7   Pituitary MRI (01/18/2019): Normal pituitary MRI.  No evidence of adenoma or other abnormality.  We started him on testosterone: 01/2019: 50 mg every 2 weeks   03/2019: Testosterone level was low >> I advised him to increase the dose to 50 mg every week. However, he is was taking 100 mg (0.5 mL) every 2 weeks. 06/2019: I recommended testosterone to 50 mg (0.25 mL) every week >> however, he continued 0.5 mL every 2 weeks  06/2020: He finally started 0.25 ml q 1 week >> feels much better  Reviewed pertinent history:  He remembered being found to have a low testosterone approximately 10 years ago.  He was tried on testosterone gel and then, but came off afterwards.  He then saw Dr. Forde Dandy who also checked the testosterone and patient was told that this was normal.    His wife got pregnant after fertility tx at [facility]. Pt's sperm count was reportedly normal, but on a lower side. He admits for decreased libido Has no  difficulty obtaining but sometimes has problems maintaining an erection No trauma to testes, testicular irradiation but had surgery for L hydrocele.  He  mentions that after the surgery he developed a perineal leak (between scrotum and the rectum). He also has problems initiating urine flow  No h/o of mumps orchitis/h/o autoimmune ds. No h/o cryptorchidism He grew and went through puberty after his peers - college + mild shrinking of testes. No very small testes (<5 ml).  No incomplete/delayed sexual development     No breast discomfort/+ gynecomastia (since he was on dialysis).    No loss of body hair (axillary/pubic)/decreased need for shaving + height loss - 2 inches Lately + abnormal sense of smell  + hot flushes when his blood sugars are low No vision problems No worst HA of his life No FH of hypogonadism/infertility  No personal h/o infertility - has children No FH of hemochromatosis or pituitary tumors No excessive weight gain or loss.  No chronic pain. Not on opiates, but Tramadol bid >> now off. He does not take steroids.  No alcohol No anabolic steroids use No herbal medicines + antidepressants: Lexapro No AI ds in his family.  He sees urology.  Dr. Kathlen Mody. He has OSA-on CPAP He had a rectal fistula surgery at the beginning of 2021. He takes a vitamin D supplement. B12 vitamin was on 01/05/2021: Normal, at 852  ROS: + see HPI Neurological: + tremors (chronic, from Prograft)/+ numbness/+ tingling/no dizziness  I reviewed pt's medications, allergies, PMH, social hx, family hx, and changes were documented in the history of present illness. Otherwise, unchanged from my initial visit note.  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     Continuous Blood Gluc Sensor (DEXCOM G6 SENSOR) MISC APPLY SENSOR AS DIRECTED TO BODY FOR CONTINUOUS GLUCOSE MONITORING. CHANGE SENSOR EVERY 10 DAYS. 9 each 3   Continuous Blood Gluc Transmit (DEXCOM G6 TRANSMITTER) MISC 1 Device by Does not apply route every 3 (three) months. 1 each 3   escitalopram (LEXAPRO) 10 MG tablet Take 10 mg by mouth 2 (two) times daily.     Glucagon (BAQSIMI TWO PACK) 3 MG/DOSE POWD Place 3 mg into the nose once as needed for up to 1 dose. 1 each 11   Insulin Aspart, w/Niacinamide, (FIASP) 100 UNIT/ML SOLN Use up to 50 units via pump daily. 40 mL 1   Insulin Disposable Pump (OMNIPOD 5 G6 INTRO, GEN 5,) KIT 1 each by Does not apply route as needed. 1 kit 0   Insulin Disposable Pump (OMNIPOD 5 G6 POD, GEN 5,) MISC 1 each by Does not apply route every 3 (three) days. 30 each 3   levothyroxine (SYNTHROID) 75 MCG tablet TAKE 1 TABLET(75 MCG) BY MOUTH DAILY 90 tablet 3   Multiple Vitamins-Minerals (MULTIVITAMINS THER. W/MINERALS) TABS Take 1 tablet by mouth daily.     ondansetron (ZOFRAN) 4 MG tablet Take 1 tablet (4 mg total) by mouth every 8 (eight) hours as needed for nausea or vomiting. (Patient not taking: Reported on 02/12/2021) 15 tablet 0   SYRINGE/NEEDLE, DISP, 1 ML 23G X 1" 1 ML MISC Use every week 50 each 1   tacrolimus (PROGRAF) 1 MG capsule Take 3 mg by mouth 2 (two) times daily.     testosterone cypionate (DEPOTESTOSTERONE CYPIONATE) 200 MG/ML injection INJECT 0.25 MLS INTO THE MUSCLE ONCE A WEEK 3 mL 3    traMADol (ULTRAM) 50 MG tablet Take 50-100 mg by mouth every 6 (six) hours as needed for pain. (Patient not taking: No sig reported)     TUBERCULIN SYR 1CC/25GX5/8" (B-D TB SYRINGE 1CC/25GX5/8") 25G X 5/8" 1 ML MISC USE 1 EVERY 2 WEEKS 8 each 12   Vitamin D, Ergocalciferol, (DRISDOL) 1.25 MG (50000 UT) CAPS capsule Take 50,000 Units by mouth every Saturday.  (Patient not taking: Reported on 02/12/2021)     No current facility-administered medications on file prior to visit.    Allergies  Allergen Reactions   Tomato Nausea And Vomiting    Immediate vomiting    Other Nausea And Vomiting    RAW Canteloupe, banana, tomatoes, all raw vegetables  Unless the vegetables have dressing on them.  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   Codeine Nausea And Vomiting   Metoclopramide Hcl Nausea And Vomiting   Family History  Problem Relation Age of Onset   Stroke Mother    Lymphoma Father    Diabetes type II Father    PE: BP 132/82 (BP Location: Right Arm, Patient Position: Sitting, Cuff Size: Normal)    Pulse 86    Ht 5' 9"  (1.753 m)    Wt 212 lb 9.6 oz (96.4 kg)    SpO2 97%    BMI 31.40 kg/m  Wt Readings from Last 3 Encounters:  06/09/21 212 lb 9.6  oz (96.4 kg)  02/12/21 211 lb (95.7 kg)  11/02/20 211 lb 9.6 oz (96 kg)   Constitutional: overweight, in NAD Eyes: PERRLA, EOMI, no exophthalmos ENT: moist mucous membranes, no thyromegaly, no cervical lymphadenopathy Cardiovascular: RRR, No MRG Respiratory: CTA B Musculoskeletal: no deformities, strength intact in all 4 Skin: moist, warm, no rashes Neurological: + tremor with outstretched hands, DTR normal in all 4 Diabetic Foot Exam - Simple   Simple Foot Form Diabetic Foot exam was performed with the following findings: Yes 06/09/2021  8:52 AM  Visual Inspection No deformities, no ulcerations, no other skin breakdown bilaterally: Yes Sensation Testing Intact to touch and monofilament testing bilaterally: Yes Pulse  Check Posterior Tibialis and Dorsalis pulse intact bilaterally: Yes Comments    ASSESSMENT: 1. DM1, uncontrolled, without complications - h/o DKA - mild gastroparesis - proliferative retinopathy - ESRD -history of being on HD and peritoneal dialysis for 5 years - PN  2. Hashimoto's thyroiditis  3.  Hypogonadotropic  4. Low IGF1  PLAN:  1. Patient with longstanding, uncontrolled, type 1 diabetes, previously on OmniPod DASH insulin pump, since OmniPod 5 (which integrates with his Dexcom CGM) was not approved by his insurance.  Since last visit, however, he was able to start on the OmniPod 5.  He feels that his sugars improved.  He would like to switch to the Dexcom G7 CGM.  I advised him that this is not available yet but he will be very soon. -At last visit, reviewing his CGM trends, sugars were higher than before and variable.  His sugars were only closer to the normal interval around 1 PM, but they were otherwise very high throughout the day and night.  He did not have an appointment with a nutritionist as recommended.  Reviewing his pump at that time and noticed that he did not make any of the changes that I suggested at the previous visit.  At last visit, I also noticed that he was not entering carbs and bolusing for meals at the beginning of the meal and we discussed that this is not conducive to good control.  I strongly advised him to try to bolus before each meal and answered the corresponding carbs.  I again advised him to schedule an appointment with a nutritionist.  He was not able to see her yet.  HbA1c at last visit was lower, at 8.9, but this was lower than expected from his CGM downloads. CGM interpretation: -At today's visit, we reviewed his CGM downloads: It appears that 46.1% of values are in target range (goal >70%), while 53.9% are higher than 180 (goal <25%), and 0% are lower than 70 (goal <4%).  The calculated average blood sugar is 199.  The projected HbA1c for the next 3  months (GMI) is 8.1%. -Reviewing the CGM trends, it appears that his sugars remain mostly high during the night and they increase after every meal, especially after lunch and then after dinner. -Reviewing his insulin pump data, it appears that he is not entering carbs consistently and when he does, he enters a small amount (15 g) as he is afraid that he may drop his sugars too low afterwards.  Moreover, he misses boluses especially before lunch and as a consequence, sugars after this meal are higher.  Also, he may bolus for meals after the sugars are already high postprandially, and this predisposes him to lows, which he noticed before lunch and also after dinner.  At this visit, we discussed about the importance of  taking Fiasp before each meal.  However, if he misses the dose and he is within 30 minutes from the meal, I advised him to enter 50% of the carbs to calculate the bolus.  If it is 1 to 2 hours after the meal, I advised him to only take a correction, and not enter any carbs at that time to prevent hypoglycemia. -I again advised him to trust the pump when he boluses and enter more carbs into the pump since now he has the closed-loop system, which is very safe.  The pump is giving him micro boluses and this has helped significantly, reflected in the HbA1c today, which is better that he had in a long time. -I suggested to: Patient Instructions  Please continue: - basal rates: 12 am: 0.85 5 am: 0.75 1 pm: 1 3 pm: 1 6 pm: 1.2 - ICR  12 am: 1:8  11 am: 1:8 - target: 110-140 - ISF: 12 am: 50 8 pm: 70 - active insulin time: 4h  - we checked his HbA1c: 7.6% (lower) - advised to check sugars at different times of the day - 4x a day, rotating check times - advised for yearly eye exams >> he is UTD - return to clinic in 4 months  2. Hashimoto's thyroiditis - latest thyroid labs reviewed with pt. >> normal in 12/2020 - he continues on LT4 75 mcg daily - pt feels good on this dose. - we  discussed about taking the thyroid hormone every day, with water, >30 minutes before breakfast, separated by >4 hours from acid reflux medications, calcium, iron, multivitamins. Pt. is taking it correctly.  3.  Hypogonadotropic hypogonadism -Found during investigation for low libido.  The total testosterone was normal while SHBG was high and free testosterone was low.  Further investigation pointed towards mild hypogonadotropic hypogonadism.  A pituitary MRI was negative for tumor. He reported previous testicular surgery and a possible leak in the perineum, and since he also had problems initiating urine stream (possible BPH), I referred him to urology.  He had a benign evaluation and was cleared for testosterone treatment.  PSA and hematocrit levels were normal before starting testosterone.  Recent starting testosterone in 01/2019: 50 mg every 14 days.  However, his testosterone level was, so I advised him to increase the dose to 50 mg every 7 days.  He misunderstood instructions and was taking 100 mg (0.5 mL of 200 mg/mL) every 2 weeks.  I advised him to take 0.25 mL every week (50 mg).  At last visit, he was still taking 0.5 mL every 2 weeks and mentioned that this was working better for him.  We continued the same dose.  -Overall, he felt better after starting testosterone improved libido and energy.  He then started to have hot flashes and he had a slightly low testosterone at his visit with PCP.  However, at that time, he could not remember exactly when the labs were drawn (whether they were drawn in the morning or halfway between injections).  Before last visit, he finally moved to the testosterone to every week (0.25 mL, 50 mg) and he felt that this helped a lot. -At last visit, the calculated free testosterone level was normal, 83.  We did not change his regimen. -CBC and PSA were normal in 06/2020.  He had another CBC that was normal in 12/2020. -We will repeat a testosterone level and PSA at next  visit -He is followed by urology (Dr. Kathlen Mody) -I encouraged him to  have annual DRE's with PCP or urologist  4. Low IGF1 -He had a low IGF-I.  Pituitary work-up with a pituitary MRI did not not reveal any tumor.  -This is most likely related to hypogonadism and hyperglycemia/uncontrolled diabetes -We will continue to follow him expectantly  Philemon Kingdom, MD PhD [facility] Endocrinology

## 2021-06-09 NOTE — Patient Instructions (Signed)
Please continue: - basal rates: 12 am: 0.85 5 am: 0.75 1 pm: 1 3 pm: 1 6 pm: 1.2 - ICR  12 am: 1:8  11 am: 1:8 - target: 110-140 - ISF: 12 am: 50 8 pm: 70 - active insulin time: 4h

## 2021-06-09 NOTE — Addendum Note (Signed)
Addended by: Lauralyn Primes on: 06/09/2021 09:03 AM   Modules accepted: Orders

## 2021-07-12 ENCOUNTER — Encounter: Payer: Self-pay | Admitting: Internal Medicine

## 2021-07-13 ENCOUNTER — Encounter: Payer: Self-pay | Admitting: Internal Medicine

## 2021-07-28 ENCOUNTER — Encounter: Payer: Self-pay | Admitting: Internal Medicine

## 2021-08-03 ENCOUNTER — Encounter: Payer: Self-pay | Admitting: Internal Medicine

## 2021-08-03 ENCOUNTER — Telehealth: Payer: Self-pay

## 2021-08-03 ENCOUNTER — Other Ambulatory Visit (HOSPITAL_COMMUNITY): Payer: Self-pay

## 2021-08-03 NOTE — Telephone Encounter (Signed)
Patient Advocate Encounter ?  ?Received notification from patient calls that prior authorization for Quinter is required by his/her insurance [employer]. ?  ?PA submitted on 08/03/21 ? ?Key#: [id number] ? ?Status is pending ?   ?[facility] will continue to follow: ? ?Patient Advocate ?Fax: [phone]  ?

## 2021-08-04 ENCOUNTER — Other Ambulatory Visit (HOSPITAL_COMMUNITY): Payer: Self-pay

## 2021-08-04 NOTE — Telephone Encounter (Signed)
Patient Advocate Encounter ? ?Prior Authorization for [facility] G6 transmitter & sensors has been approved.   ? ?PA# [id number] ? ?Effective dates: 08/03/21 through 08/04/22 ? ?Per Test Claim Patients co-pay for Transmitter is: $90.  Sensors are $30. ? ?Spoke with Pharmacy to Process. ? ?Patient Advocate ?Fax: [phone]  ?

## 2021-08-09 ENCOUNTER — Encounter: Payer: Self-pay | Admitting: Internal Medicine

## 2021-08-09 DIAGNOSIS — E10319 Type 1 diabetes mellitus with unspecified diabetic retinopathy without macular edema: Secondary | ICD-10-CM

## 2021-08-10 ENCOUNTER — Other Ambulatory Visit: Payer: Self-pay

## 2021-08-11 ENCOUNTER — Other Ambulatory Visit: Payer: Self-pay

## 2021-08-11 DIAGNOSIS — E10319 Type 1 diabetes mellitus with unspecified diabetic retinopathy without macular edema: Secondary | ICD-10-CM

## 2021-08-11 MED ORDER — DEXCOM G6 SENSOR MISC: 3 refills | Status: DC

## 2021-08-11 MED ORDER — DEXCOM G6 TRANSMITTER MISC: 3 refills | Status: DC

## 2021-08-11 NOTE — Telephone Encounter (Signed)
Rx for dexcom requested to [place]. ?

## 2021-09-02 ENCOUNTER — Encounter: Payer: Self-pay | Admitting: Internal Medicine

## 2021-09-02 DIAGNOSIS — E10319 Type 1 diabetes mellitus with unspecified diabetic retinopathy without macular edema: Secondary | ICD-10-CM

## 2021-09-03 NOTE — Telephone Encounter (Signed)
Patient called to advise that he is completely out of Fiasp and needs a refill ASAP.  ?PHARMACY:  [facility] on [address] in [place]  ? ?HAS THE PATIENT CONTACTED THEIR PHARMACY?  Yes for the last 3 weeks ? ?IS THIS A 90 DAY SUPPLY :  ? ?IS PATIENT OUT OF MEDICATION:  YES ? ?IF NOT; HOW MUCH IS LEFT: NONE ? ?LAST APPOINTMENT DATE: @4 /19/2023 ? ?NEXT APPOINTMENT DATE:@8 /18/2023 ? ?DO WE HAVE YOUR PERMISSION TO LEAVE A DETAILED MESSAGE?: YES ? ?OTHER COMMENTS:  ?**Let patient know to contact pharmacy at the end of the day to make sure medication is ready. ** ? ?** Please notify patient to allow 48-72 hours to process** ? ?**Encourage patient to contact the pharmacy for refills or they can request refills through [facility]** ? ?

## 2021-09-04 ENCOUNTER — Encounter: Payer: Self-pay | Admitting: Internal Medicine

## 2021-09-06 MED ORDER — FIASP 100 UNIT/ML IJ SOLN: INTRAMUSCULAR | 1 refills | Status: DC

## 2021-09-06 NOTE — Telephone Encounter (Signed)
Rx sent to preferred pharmacy.

## 2021-09-27 ENCOUNTER — Telehealth: Payer: Self-pay

## 2021-09-27 ENCOUNTER — Encounter: Payer: Self-pay | Admitting: Internal Medicine

## 2021-09-27 NOTE — Telephone Encounter (Signed)
Pt called to advise he is having an ultrasound and was advised not to eat from midnight until after the appt scheduled for 8 am. Pt was also advised the appt could take anywhere from 3-5 hours. Pt requested new settings for his pump. Pt requested a MyChart message as he is currently at work.

## 2021-10-04 ENCOUNTER — Encounter: Payer: Self-pay | Admitting: Internal Medicine

## 2021-10-25 ENCOUNTER — Encounter: Payer: Self-pay | Admitting: Internal Medicine

## 2021-11-22 ENCOUNTER — Encounter: Payer: Self-pay | Admitting: Internal Medicine

## 2021-11-22 ENCOUNTER — Other Ambulatory Visit: Payer: Self-pay

## 2021-11-22 DIAGNOSIS — R7989 Other specified abnormal findings of blood chemistry: Secondary | ICD-10-CM

## 2021-11-22 MED ORDER — OMNIPOD 5 DEXG7G6 PODS GEN 5 MISC: 1.0000 | 3 refills | Status: DC

## 2021-12-10 ENCOUNTER — Ambulatory Visit: Payer: BC Managed Care – PPO | Admitting: Internal Medicine

## 2021-12-10 ENCOUNTER — Encounter: Payer: Self-pay | Admitting: Internal Medicine

## 2021-12-10 VITALS — BP 132/78 | HR 89 | Ht 69.0 in | Wt 196.8 lb

## 2021-12-10 DIAGNOSIS — E10319 Type 1 diabetes mellitus with unspecified diabetic retinopathy without macular edema: Secondary | ICD-10-CM | POA: Diagnosis not present

## 2021-12-10 DIAGNOSIS — E038 Other specified hypothyroidism: Secondary | ICD-10-CM | POA: Diagnosis not present

## 2021-12-10 DIAGNOSIS — E230 Hypopituitarism: Secondary | ICD-10-CM

## 2021-12-10 DIAGNOSIS — E063 Autoimmune thyroiditis: Secondary | ICD-10-CM | POA: Diagnosis not present

## 2021-12-10 LAB — LIPID PANEL
Cholesterol: 150 mg/dL (ref 0–200)
HDL: 51.8 mg/dL (ref >39.00)
LDL Cholesterol: 87 mg/dL (ref 0–99)
NonHDL: 98.25
Total CHOL/HDL Ratio: 3
Triglycerides: 57 mg/dL (ref 0.0–149.0)
VLDL: 11.4 mg/dL (ref 0.0–40.0)

## 2021-12-10 LAB — POCT GLYCOSYLATED HEMOGLOBIN (HGB A1C): Hemoglobin A1C: 8.4 % — AB (ref 4.0–5.6)

## 2021-12-10 LAB — PSA: PSA: 0.88 ng/mL (ref 0.10–4.00)

## 2021-12-10 NOTE — Patient Instructions (Addendum)
Please continue: - basal rates: 12 am: 0.85 5 am: 0.75 1 pm: 1 3 pm: 1 6 pm: 1.2 - ICR  12 am: 1:8  11 am: 1:8 - target: 110-140 - ISF: 12 am: 50 8 pm: 70 - active insulin time: 4h  Please do the following approximately 15 minutes before every meal: - Enter carbs (C) - Enter sugars (S) - Start insulin bolus (I)  Look up the iLet pump.  Please stop at the lab.  Please return in 4 months.

## 2021-12-10 NOTE — Progress Notes (Signed)
Patient ID: Daniel Hill, male   DOB: 17-Aug-1975, 46 y.o.   MRN: [id number]   HPI: Daniel Hill is a 46 y.o.-year-old male, initially referred by his nephrologist, Dr. Jimmy Footman, presenting for follow-up for DM1, uncontrolled, with complications (DKA, mild gastroparesis, proliferative retinopathy, ESRD -history of being on HD and peritoneal dialysis for 5 years, PN). Last visit 6 months ago.  Interim history: He continues to be extremely busy at work (works at [employer]). No increased urination, blurry vision, nausea, but he recently had chest pain after cutting trees in his yard.  He was investigated by cardiology had a negative stress test. He had a steroid taper for vertigo related to inner ear sxs >> sugars 500s.  Since last visit, 2 months ago, he contacted our office (I was out) about increased aggression on testosterone.  He was interested in stopping it.  My colleague advised him to do so.  He is now off testosterone.  He feels calmer.  DM1: Reviewed history: Patient has been diagnosed with diabetes at 46 y/o. She was prev. Seen by [facility] endocrinology.  She is status post kidney-pancreatic transplant in 2003, but his pancreas transplant failed in 2013.  He is considered too high risk for a new pancreatic transplant.  His retinopathy and gastroparesis improved after transplant.  He had several eye surgeries (vitrectomy and scleral buckle procedures).  He has chronic blindness in his left eye.  Reviewed HbA1c levels: Lab Results  Component Value Date   HGBA1C 7.6 (A) 06/09/2021   HGBA1C 8.9 (A) 11/02/2020   HGBA1C 10.1 (H) 07/21/2020   HGBA1C 9.8 (A) 07/16/2020   HGBA1C 9.2 (A) 03/16/2020   HGBA1C 8.7 (A) 04/03/2019   HGBA1C 9.7 (A) 12/28/2018   HGBA1C 8.9 (A) 03/21/2018   HGBA1C 9.9 (H) 05/19/2011  01/01/2020: HbA1c 9.1% 07/15/2016: HbA1c 7.8%  He was on the previous regimen: - Tresiba 10 units at bedtime - Humalog - ICR 1:15, target 120, ISF 40 - injects after meals (ends up  with 2-6 units per meal)  Now on an insulin pump:  -Omnipod DASH  Pump -started OmniPod 5 - started 12/2020  CGM: -Prev. On freestyle libre 2, but had problems with the sensor coming off -Initially Dexcom CGM not covered, but now on Dexcom G6  Insulin: -Previously NovoLog in the pump -Now Fiasp  Pump settings  - basal rates: 12 am: 0.8 >> 0.85 5 am: 0.8 >> 0.75 1 pm: 1 3 pm: 0.9 >> 1 6 pm: 1.1 >> 1.2 >>  1.0 - ICR  12 am: 1:7 >> 8  11 am: 1:7  >> 8 - target: 110-120 >> 110-140 - ISF: 12 am: 50 8 pm: 70 - active insulin time: 4h TDD from basal insulin: ~11 >> 17-19 units/day >> 57% >> 47% (18.5 units) TDD from bolus insulin: ~21 units >> 4-14 units/day >> 43% >> 53% Total daily dose 35-60 units a day    He checks his blood sugars more than 4 times a day with his Dexcom CGM:    Previously:   Previously:   Lowest sugar was 40 >> 59; he has hypoglycemia awareness in the 60s.  No previous hypoglycemia admission.  He has an unexpired glucagon kit at home. Highest sugar was 500 >> 300s >> HI.  He has a distant history of DKA admission-before his renal transplant.  Pt's meals are: - Breakfast: eggs or oatmeal - Lunch: meat and veggies, sandwich, soup - Dinner: mostly meat and veggies, sometimes pasta and bread -  Snacks: 2  - usually apples or beef jerky.  -He has a history of ESRD, now status post renal transplant- Dr. Jimmy Footman >> Dr Posey Pronto, last BUN/creatinine:  06/11/2021: Glucose 183, BUN/creatinine 16/1.21, GFR 75, protein to creatinine ratio 134.3  06/23/2020: Glucose 371, BUN/creatinine 20/1.19, GFR 77 03/23/2020: Glucose 266, BUN/creatinine 16/0.87, GFR 104 01/01/2020: BUN/creatinine 70/1.13, glucose 136 10/27/2019: Glucose 163, BUN/creatinine 15/1.10, GFR 81 Lab Results  Component Value Date   BUN 13 07/28/2018   BUN 16 05/02/2018   CREATININE 0.95 07/28/2018   CREATININE 0.97 05/02/2018  02/2018: Cr 0.8  -+ HL; last set of lipids: Lab Results  Component  Value Date   CHOL 163 11/02/2020   HDL 47.70 11/02/2020   LDLCALC 106 (H) 11/02/2020   TRIG 46.0 11/02/2020   CHOLHDL 3 11/02/2020  07/31/2019: 162/94/42/102 He takes fish oil - occasionally.  He declined statins.  - last eye exam was in 04/2021: + DR; he is seen at [facility]. Dr. Cordelia Pen.  -He does have numbness and tingling in his feet.  Last foot exam 05/2021.  He was previously on Reglan and he has intolerance to this.  Pt has FH of DM in father.  Hashimoto's thyroiditis:  Pt is on levothyroxine 75 mcg daily, taken: - in am - fasting - at least 30 min from b'fast - no calcium - no iron - no multivitamins - no PPIs - not on Biotin  TSH levels were normal: 09/07/2021: TSH 2.56 01/05/2021: TSH 2.11 Lab Results  Component Value Date   TSH 3.660 07/21/2020  06/23/2020: TSH 1.392 01/01/2020: TSH 2.502  Pt denies: - feeling nodules in neck - hoarseness - choking He does have occasional dysphagia, possibly related to dry mouth from his CPAP.  He does have a family history of thyroid cancer.  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  Previously taking 0.5 mL every 2 weeks, then 0.25 mL every week.  He felt much better, but he had marital strain due to increased aggression.  Therefore, in 09/2021, he stopped testosterone.  Reviewed history:  He had a normal total testosterone with a low free testosterone.   No signs of hemochromatosis.   His HIV test was nonreactive.   He had a normal PSA, normal ECG and prolactin, normal cortisol.   He had a low IGF-I, also.  Pertinent labs were reviewed: 09/07/2021: Hemoglobin/hematocrit 13.2 (14-17.5)/39.4% (41.5-50.4%)  Component     Latest Ref Rng & Units 11/02/2020  Testosterone, Serum (Total)     ng/dL 670  % Free Testosterone     % 0.6  Free Testosterone, S     pg/mL 40 (L)  Sex Hormone Binding Globulin     nmol/L 75.3 (H)  Free testosterone calculated with the  Omni equation, was normal, at 83. ...  Component     Latest Ref Rng & Units 11/09/2018 12/12/2018  Testosterone, Serum (Total)     ng/dL 494 670  % Free Testosterone     % 0.5 0.5  Free Testosterone, S     pg/mL 25 (L) 34 (L)  Sex Hormone Binding Globulin     nmol/L 111.0 (H) 136.5 (H)  Estradiol     pg/mL  29  Free Estradiol, Percent     %  1.3  Free Estradiol, Serum     pg/mL  0.38  hCG Quant     0 - 3 mIU/mL  <1  Prolactin     4.0 - 15.2 ng/mL  8.0  LH     1.7 - 8.6 mIU/mL  4.9  FSH     1.5 - 12.4 mIU/mL  3.6  Insulin-Like GF-1     84 - 270 ng/mL  43 (L)  Prostate Specific Ag, Serum     0.0 - 4.0 ng/mL  0.8  Cortisol     ug/dL  10.7   Pituitary MRI (01/18/2019): Normal pituitary MRI.  No evidence of adenoma or other abnormality.  We started him on testosterone: 01/2019: 50 mg every 2 weeks   03/2019: Testosterone level was low >> I advised him to increase the dose to 50 mg every week. However, he is was taking 100 mg (0.5 mL) every 2 weeks. 06/2019: I recommended testosterone to 50 mg (0.25 mL) every week >> however, he continued 0.5 mL every 2 weeks  06/2020: He finally started 0.25 ml q 1 week >> feels much better  Reviewed pertinent history:  He remembered being found to have a low testosterone approximately 10 years ago.  He was tried on testosterone gel and then, but came off afterwards.  He then saw Dr. Forde Dandy who also checked the testosterone and patient was told that this was normal.    His wife got pregnant after fertility tx at [facility]. Pt's sperm count was reportedly normal, but on a lower side. He admits for decreased libido Has no  difficulty obtaining but sometimes has problems maintaining an erection No trauma to testes, testicular irradiation but had surgery for L hydrocele.  He mentions that after the surgery he developed a perineal leak (between scrotum and the rectum). He also has problems initiating urine flow  No h/o of mumps orchitis/h/o  autoimmune ds. No h/o cryptorchidism He grew and went through puberty after his peers - college + mild shrinking of testes. No very small testes (<5 ml).  No incomplete/delayed sexual development     No breast discomfort/+ gynecomastia (since he was on dialysis).    No loss of body hair (axillary/pubic)/decreased need for shaving + height loss - 2 inches Lately + abnormal sense of smell  + hot flushes when his blood sugars are low No vision problems No worst HA of his life No FH of hypogonadism/infertility  No personal h/o infertility - has children No FH of hemochromatosis or pituitary tumors No excessive weight gain or loss.  No chronic pain. Not on opiates, but Tramadol bid >> now off. He does not take steroids.  No alcohol No anabolic steroids use No herbal medicines + antidepressants: Lexapro No AI ds in his family.  He sees urology.  Dr. Kathlen Mody. He has OSA-on CPAP He had a rectal fistula surgery at the beginning of 2021. He takes a vitamin D supplement. B12 vitamin was on 01/05/2021: Normal, at 852  ROS: + see HPI Neurological: + tremors (chronic, from Prograft)/+ numbness/+ tingling/no dizziness  I reviewed pt's medications, allergies, PMH, social hx, family hx, and changes were documented in the history of present illness. Otherwise, unchanged from my initial visit note.  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     Continuous Blood Gluc Sensor (DEXCOM G6 SENSOR) MISC Use as instructed to check blood sugar. Change every 10 days 9 each 3   Continuous Blood Gluc Transmit (DEXCOM G6 TRANSMITTER) MISC Use as instructed. Change every 90 days 1 each 3   escitalopram (LEXAPRO) 10 MG tablet Take 10 mg by mouth 2 (two) times daily.     Glucagon (BAQSIMI TWO PACK) 3 MG/DOSE POWD Place 3 mg into the nose once as needed for up to 1 dose. 1 each 11   Insulin Aspart, w/Niacinamide, (FIASP) 100 UNIT/ML SOLN Use up to 50 units via pump daily. 40 mL 1   Insulin Disposable Pump (OMNIPOD 5 G6 INTRO, GEN 5,) KIT 1 each by Does not apply route as needed. 1 kit 0   Insulin Disposable Pump (OMNIPOD 5 G6 POD, GEN 5,) MISC 1 each by Does not apply route every 3 (three) days. 30 each 3   levothyroxine (SYNTHROID) 75 MCG tablet TAKE 1 TABLET(75 MCG) BY MOUTH DAILY 90 tablet 3   Multiple Vitamins-Minerals (MULTIVITAMINS THER. W/MINERALS) TABS Take 1 tablet by mouth daily.     ondansetron (ZOFRAN) 4 MG tablet Take 1 tablet (4 mg total) by mouth every 8 (eight) hours as needed for nausea or vomiting. (Patient not taking: Reported on 02/12/2021) 15 tablet 0   SYRINGE/NEEDLE, DISP, 1 ML 23G X 1" 1 ML MISC Use every week 50 each 1   tacrolimus (PROGRAF) 1 MG capsule Take 3 mg by mouth 2 (two) times daily.     testosterone cypionate (DEPOTESTOSTERONE CYPIONATE) 200 MG/ML injection INJECT 0.25 MLS INTO THE MUSCLE ONCE A WEEK 3 mL 3   traMADol (ULTRAM) 50 MG tablet Take 50-100 mg by mouth every 6 (six) hours as needed for pain. (Patient not taking: No sig reported)     TUBERCULIN SYR 1CC/25GX5/8" (B-D TB SYRINGE 1CC/25GX5/8") 25G X 5/8" 1 ML MISC USE 1  EVERY 2 WEEKS 8 each 12   Vitamin D, Ergocalciferol, (DRISDOL) 1.25 MG (50000 UT) CAPS capsule Take 50,000 Units by mouth every Saturday.  (Patient not taking: Reported on 02/12/2021)     No current facility-administered medications on file prior to visit.    Allergies  Allergen Reactions   Tomato Nausea And Vomiting    Immediate vomiting    Other Nausea And Vomiting    RAW Canteloupe, banana, tomatoes, all raw vegetables  Unless the vegetables have dressing on them.  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   Codeine Nausea And Vomiting   Metoclopramide Hcl Nausea And Vomiting   Family History  Problem Relation Age of Onset   Stroke Mother    Lymphoma Father    Diabetes type II Father    PE: BP 132/78 (BP Location: Right Arm, Patient Position: Sitting, Cuff Size: Normal)   Pulse 89   Ht 5' 9"  (1.753 m)   Wt 196 lb 12.8 oz (89.3 kg)  SpO2 99%   BMI 29.06 kg/m  Wt Readings from Last 3 Encounters:  12/10/21 196 lb 12.8 oz (89.3 kg)  06/09/21 212 lb 9.6 oz (96.4 kg)  02/12/21 211 lb (95.7 kg)   Constitutional: overweight, in NAD Eyes: EOMI, no exophthalmos ENT: moist mucous membranes, no thyromegaly, no cervical lymphadenopathy Cardiovascular: RRR, No MRG Respiratory: CTA B Musculoskeletal: no deformities, Skin: moist, warm, no rashes Neurological: + tremor with outstretched hands  ASSESSMENT: 1. DM1, uncontrolled, without complications - h/o DKA - mild gastroparesis - proliferative retinopathy - ESRD -history of being on HD and peritoneal dialysis for 5 years - PN  2. Hashimoto's thyroiditis  3.  Hypogonadotropic  4. Low IGF1 -He had a low IGF-I.  Pituitary work-up with a pituitary MRI did not not reveal any tumor.  -This is most likely related to hypogonadism and hyperglycemia/uncontrolled diabetes -No further investigation needed for this  PLAN:  1. Patient with longstanding, uncontrolled type 1 diabetes, previously on an OmniPod Dash insulin  pump but switched to an OmniPod 5 integrated with the Dexcom G6 CGM and 12/2020.  At last visit, sugars remain mostly high during the night and they were increasing after every meal, especially lunch and dinner.  He was not entering carbs consistently and when he did, he entered only a small amount (15 g) as he was afraid of dropping his sugars postprandially.  We did discuss about the importance of entering carbs and taking Fiasp before every meal, to be able to adjust his insulin to carb ratios, if needed.  I advised him that if he needed to take the insulin after the meal, to enter only 50% of the carbs eaten for that meal.  Also, if he has to bolus 1 to 2 hours after the meal, I advised him to only do a correction, and not enter any carbs, to prevent hypoglycemia.  Even with these shortcomings, his HbA1c was better that he had in a long while, at last visit, due to the closed-loop nature of his insulin delivery system. CGM interpretation: -At today's visit, we reviewed his CGM downloads: It appears that 41% of values are in target range (goal >70%), while 59% are higher than 180 (goal <25%), and 0% are lower than 70 (goal <4%).  The calculated average blood sugar is 212.  The projected HbA1c for the next 3 months (GMI) is 8.4%. -Reviewing the CGM trends, sugars are uniformly high, fluctuating around the upper limit of the target range or above.  They increase more his 3 meals of the day.  Review of his pump records, he is barely entering any carbs into the pump.  He has consecutive days in which she does not enter any, and when he does, he enters 10-20 g of carbs maybe once a day.  We discussed that this is unacceptable for good diabetes control.  I again strongly advised him to enter carbs every time he eats and bolus for them.  Without doing this, we would not be able to control his diabetes. -We did discuss about the new iLet insulin pump and discussed about differences with his current pump.  He is very  interested in trying this.  However, I advised him that even for this pump, he needs to tell the pump whenever he is eating and enter information about the size of the meal (small-medium-large), but not worry about carbs.  This is a very new pump and we can try this in the near future, but I would like  to get more information about how it works in the real world before starting this for him. -I suggested to: Patient Instructions  Please continue: - basal rates: 12 am: 0.85 5 am: 0.75 1 pm: 1 3 pm: 1 6 pm: 1.2 - ICR  12 am: 1:8  11 am: 1:8 - target: 110-140 - ISF: 12 am: 50 8 pm: 70 - active insulin time: 4h  Please do the following approximately 15 minutes before every meal: - Enter carbs (C) - Enter sugars (S) - Start insulin bolus (I)  Look up the iLet pump.  Please stop at the lab.  Please return in 4 months.  - we checked his HbA1c: 7%  - advised to check sugars at different times of the day - 4x a day, rotating check times - advised for yearly eye exams >> he is UTD - will check a lipid panel today (he had coffee with cream) - return to clinic in 4 months  2. Hashimoto's hypothyroidism - latest thyroid labs reviewed with pt. >> normal in 08/2021 - he continues on LT4 75 mcg daily - pt feels good on this dose. - we discussed about taking the thyroid hormone every day, with water, >30 minutes before breakfast, separated by >4 hours from acid reflux medications, calcium, iron, multivitamins. Pt. is taking it correctly.  3.  Hypogonadotropic hypogonadism -Found during investigation for low libido.  The total testosterone was normal while SHBG was high and free testosterone was low.  Further investigation pointed towards mild hypogonadotropic hypogonadism.  A pituitary MRI was negative for tumor. He reported previous testicular surgery and a possible leak in the perineum, and since he also had problems initiating urine stream (possible BPH), I referred him to urology.  He  had a benign evaluation and was cleared for testosterone treatment.  PSA and hematocrit levels were normal before starting testosterone.  He started testosterone in 01/2019: 50 mg every 14 days.  However, his testosterone level was low, so I advised him to increase the dose to 50 mg every 7 days.  He misunderstood instructions and was taking 100 mg (0.5 mL of 200 mg/mL) every 2 weeks.  I advised him to take 0.25 mL every week (50 mg) he forgot and he was still taking 0.5 mL every 2 weeks and mentioned that this was working better for him.  We continued the same dose.  -Overall, he felt better after starting testosterone improved libido and energy.  He then started to have hot flashes and he had a slightly low testosterone at his visit with PCP.  However, at that time, he could not remember exactly when the labs were drawn (whether they were drawn in the morning or halfway between injections).  He finally moved the testosterone to every week (0.25 mL, 50 mg) and he felt that this helped a lot. At last check, the calculated free testosterone level was normal, at 83.  We did not change the regimen. -However, he tells me that he developed aggression on testosterone and an stopping it 2 months ago.  He feels more calm now, but we discussed that this is not an all or nothing situation, we can try a lower dose of testosterone.  We also discussed about other different formulations which are not subjected to significant peaks and troughs, as IM testosterone.  Of note, he also dreaded giving himself the injections, due to problems finding the right needles and that the injections themselves.  He is not willing to retry a  testosterone gel.  However, we discussed about intranasal testosterone and I advised him that this is usually taken 3 times a day, which would be an intense regimen.  Also, we discussed about the oral testosterone, which, has blood pressure increases the side effects so we have to be careful with this.  I  have not prescribed this so far and we discussed about him trying to discuss with urology (Dr. Kathlen Mody, whom he currently sees) and see if they would be comfortable prescribing it for him.  He would be interested in this. -He had a slightly low hemoglobin/hematocrit on 09/07/2021.  PSA was normal in 06/2020. -We will repeat a testosterone level and PSA at this visit  - Total time spent for the visit: 40 minutes, in precharting, obtaining medical information from the chart and from the patient, reviewing his  previous labs, imaging evaluations, and treatments, reviewing his symptoms, counseling him about his conditions (please see the discussed topics above), and developing a plan to further investigate and treat them; he had a number of questions which I addressed. Component     Latest Ref Rng 12/10/2021  Cholesterol     0 - 200 mg/dL 150   Triglycerides     0.0 - 149.0 mg/dL 57.0   HDL Cholesterol     >39.00 mg/dL 51.80   VLDL     0.0 - 40.0 mg/dL 11.4   LDL (calc)     0 - 99 mg/dL 87   Total CHOL/HDL Ratio 3   NonHDL 98.25   Testosterone, Serum (Total)     ng/dL 519   % Free Testosterone     % 1.1   Free Testosterone, S     pg/mL (52 - 280) 57   Sex Hormone Binding Globulin     nmol/L 96.4 (H)   PSA     0.10 - 4.00 ng/mL 0.88     Testosterone level is actually normal.  LDL is slightly above our target of less than 70.  He declined statins in the past.  I will again check with him if he changed his mind.  Philemon Kingdom, MD PhD [facility] Endocrinology

## 2021-12-19 LAB — TESTOSTERONE, FREE AND TOTAL (INCLUDES SHBG)-(MALES)
% Free Testosterone: 1.1 %
Free Testosterone, S: 57 pg/mL
Sex Hormone Binding Globulin: 96.4 nmol/L — ABNORMAL HIGH
Testosterone, Serum (Total): 519 ng/dL

## 2022-02-03 ENCOUNTER — Encounter: Payer: Self-pay | Admitting: Internal Medicine

## 2022-02-03 DIAGNOSIS — E291 Testicular hypofunction: Secondary | ICD-10-CM | POA: Insufficient documentation

## 2022-02-11 ENCOUNTER — Encounter: Payer: Self-pay | Admitting: Internal Medicine

## 2022-02-11 ENCOUNTER — Other Ambulatory Visit: Payer: Self-pay | Admitting: Internal Medicine

## 2022-02-11 MED ORDER — LEVOTHYROXINE SODIUM 75 MCG PO TABS: ORAL_TABLET | ORAL | 3 refills | Status: AC

## 2022-02-15 ENCOUNTER — Other Ambulatory Visit: Payer: Self-pay | Admitting: Internal Medicine

## 2022-02-15 DIAGNOSIS — E10319 Type 1 diabetes mellitus with unspecified diabetic retinopathy without macular edema: Secondary | ICD-10-CM

## 2022-02-16 ENCOUNTER — Encounter: Payer: Self-pay | Admitting: Internal Medicine

## 2022-03-24 ENCOUNTER — Encounter: Payer: Self-pay | Admitting: Internal Medicine

## 2022-04-13 ENCOUNTER — Ambulatory Visit: Payer: BC Managed Care – PPO | Admitting: Internal Medicine

## 2022-04-13 NOTE — Progress Notes (Deleted)
Patient ID: Daniel Hill, male   DOB: [birth year], 46 y.o.   MRN: [id number]   HPI: Daniel Hill is a 46 y.o.-year-old male, initially referred by his nephrologist, Dr. Jimmy Footman, presenting for follow-up for DM1, uncontrolled, with complications (DKA, mild gastroparesis, proliferative retinopathy, ESRD -history of being on HD and peritoneal dialysis for 5 years, PN). Last visit 4 months ago.  Interim history: He continues to be extremely busy at work (works at [employer]). No increased urination, blurry vision, nausea. Before or last visit, he contacted our office (I was out) about increased aggression on testosterone.  He was interested in stopping it.  My colleague advised him to do so.  At our last visit, he was off testosterone.  He feels calmer off the hormone.  DM1: Reviewed history: Patient has been diagnosed with diabetes at 46 y/o. She was prev. Seen by [facility] endocrinology.  She is status post kidney-pancreatic transplant in 2003, but his pancreas transplant failed in 2013.  He is considered too high risk for a new pancreatic transplant.  His retinopathy and gastroparesis improved after transplant.  He had several eye surgeries (vitrectomy and scleral buckle procedures).  He has chronic blindness in his left eye.  Reviewed HbA1c levels: Lab Results  Component Value Date   HGBA1C 8.4 (A) 12/10/2021   HGBA1C 7.6 (A) 06/09/2021   HGBA1C 8.9 (A) 11/02/2020   HGBA1C 10.1 (H) 07/21/2020   HGBA1C 9.8 (A) 07/16/2020   HGBA1C 9.2 (A) 03/16/2020   HGBA1C 8.7 (A) 04/03/2019   HGBA1C 9.7 (A) 12/28/2018   HGBA1C 8.9 (A) 03/21/2018   HGBA1C 9.9 (H) 05/19/2011  01/01/2020: HbA1c 9.1% 07/15/2016: HbA1c 7.8%  He was on the previous regimen: - Tresiba 10 units at bedtime - Humalog - ICR 1:15, target 120, ISF 40 - injects after meals (ends up with 2-6 units per meal)  Now on an insulin pump:  -Omnipod DASH  Pump -started OmniPod 5 - started 12/2020  CGM: -Prev. On freestyle libre 2, but  had problems with the sensor coming off -Initially Dexcom CGM not covered, but now on Dexcom G6  Insulin: -Previously NovoLog in the pump -Now Fiasp  Pump settings  - basal rates: 12 am: 0.8 >> 0.85 5 am: 0.8 >> 0.75 1 pm: 1 3 pm: 0.9 >> 1 6 pm: 1.1 >> 1.2 >>  1.0 - ICR  12 am: 1:7 >> 8  11 am: 1:7  >> 8 - target: 110-120 >> 110-140 - ISF: 12 am: 50 8 pm: 70 - active insulin time: 4h TDD from basal insulin: ~11 >> 17-19 units/day >> 57% >> 47% (18.5 units) TDD from bolus insulin: ~21 units >> 4-14 units/day >> 43% >> 53% Total daily dose 35-60 units a day    He checks his blood sugars more than 4 times a day with his Dexcom CGM:  Previously:   Previously:  Lowest sugar was 40 >> 59; he has hypoglycemia awareness in the 60s.  No previous hypoglycemia admission.  He has an unexpired glucagon kit at home. Highest sugar was 500 >> 300s >> HI.  He has a distant history of DKA admission-before his renal transplant.  Pt's meals are: - Breakfast: eggs or oatmeal - Lunch: meat and veggies, sandwich, soup - Dinner: mostly meat and veggies, sometimes pasta and bread - Snacks: 2  - usually apples or beef jerky.  -He has a history of ESRD, now status post renal transplant- Dr. Jimmy Footman >> Dr Posey Pronto, last BUN/creatinine:  06/11/2021: Glucose  183, BUN/creatinine 16/1.21, GFR 75, protein to creatinine ratio 134.3  06/23/2020: Glucose 371, BUN/creatinine 20/1.19, GFR 77 03/23/2020: Glucose 266, BUN/creatinine 16/0.87, GFR 104 01/01/2020: BUN/creatinine 70/1.13, glucose 136 10/27/2019: Glucose 163, BUN/creatinine 15/1.10, GFR 81 Lab Results  Component Value Date   BUN 13 07/28/2018   BUN 16 05/02/2018   CREATININE 0.95 07/28/2018   CREATININE 0.97 05/02/2018  02/2018: Cr 0.8  -+ HL; last set of lipids: Lab Results  Component Value Date   CHOL 150 12/10/2021   HDL 51.80 12/10/2021   LDLCALC 87 12/10/2021   TRIG 57.0 12/10/2021   CHOLHDL 3 12/10/2021  07/31/2019:  162/94/42/102 He takes fish oil - occasionally.  He declined statins.  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 Dr. Cordelia Pen.  -He does have numbness and tingling in his feet.  Last foot exam 06/09/2021.  He was previously on Reglan and he has intolerance to this.  Pt has FH of DM in father.  Hashimoto's thyroiditis:  Pt is on levothyroxine 75 mcg daily, taken: - in am - fasting - at least 30 min from b'fast - no calcium - no iron - no multivitamins - no PPIs - not on Biotin  TSH levels were normal: 09/07/2021: TSH 2.56 01/05/2021: TSH 2.11 Lab Results  Component Value Date   TSH 3.660 07/21/2020  06/23/2020: TSH 1.392 01/01/2020: TSH 2.502  Pt denies: - feeling nodules in neck - hoarseness - choking He does have occasional dysphagia, possibly related to dry mouth from his CPAP.  He does have a family history of thyroid cancer.  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  He felt much better, but he had marital strain due to increased aggression.  Therefore, in 09/2021, he stopped testosterone.  He felt much better afterwards. We checked his testosterone at last visit and this was normal off the supplement for 2 months  Reviewed history:  He had a normal total testosterone with a low free testosterone.   No signs of hemochromatosis.   His HIV test was nonreactive.   He had a normal PSA, normal ECG and prolactin, normal cortisol.   He had a low IGF-I, also.  Previous testosterone supplementation: 01/2019: 50 mg every 2 weeks   03/2019: Testosterone level was low >> I advised him to increase the dose to 50 mg every week. However, he is was taking 100 mg (0.5 mL) every 2 weeks. 06/2019: I recommended testosterone to 50 mg (0.25 mL) every week >> however, he continued 0.5 mL every 2 weeks  06/2020: He finally started 0.25 ml q 1 week 09/2021: He stopped his  testosterone  Pertinent labs were reviewed: Component     Latest Ref Rng 12/10/2021  Testosterone, Serum (Total)     ng/dL 519   % Free Testosterone     % 1.1   Free Testosterone, S     pg/mL (52 - 280) 57   Sex Hormone Binding Globulin     nmol/L 96.4 (H)    09/07/2021: Hemoglobin/hematocrit 13.2 (14-17.5)/39.4% (41.5-50.4%)  Component     Latest Ref Rng & Units 11/02/2020  Testosterone, Serum (Total)     ng/dL 670  % Free Testosterone     % 0.6  Free Testosterone, S     pg/mL 40 (L)  Sex Hormone Binding Globulin     nmol/L 75.3 (H)  Free testosterone calculated with the Omni equation, was normal,  at 71. ...  Component     Latest Ref Rng & Units 11/09/2018 12/12/2018  Testosterone, Serum (Total)     ng/dL 494 670  % Free Testosterone     % 0.5 0.5  Free Testosterone, S     pg/mL 25 (L) 34 (L)  Sex Hormone Binding Globulin     nmol/L 111.0 (H) 136.5 (H)  Estradiol     pg/mL  29  Free Estradiol, Percent     %  1.3  Free Estradiol, Serum     pg/mL  0.38  hCG Quant     0 - 3 mIU/mL  <1  Prolactin     4.0 - 15.2 ng/mL  8.0  LH     1.7 - 8.6 mIU/mL  4.9  FSH     1.5 - 12.4 mIU/mL  3.6  Insulin-Like GF-1     84 - 270 ng/mL  43 (L)  Prostate Specific Ag, Serum     0.0 - 4.0 ng/mL  0.8  Cortisol     ug/dL  10.7   Lab Results  Component Value Date   PSA 0.88 12/10/2021   PSA 0.64 12/28/2018   Pituitary MRI (01/18/2019): Normal pituitary MRI.  No evidence of adenoma or other abnormality.  Reviewed pertinent history:  He remembered being found to have a low testosterone approximately 10 years ago.  He was tried on testosterone gel and then, but came off afterwards.  He then saw Dr. Forde Dandy who also checked the testosterone and patient was told that this was normal.    His wife got pregnant after fertility tx at [facility]. Pt's sperm count was reportedly normal, but on a lower side. He admits for decreased libido Has no  difficulty obtaining but sometimes has  problems maintaining an erection No trauma to testes, testicular irradiation but had surgery for L hydrocele.  He mentions that after the surgery he developed a perineal leak (between scrotum and the rectum). He also has problems initiating urine flow  No h/o of mumps orchitis/h/o autoimmune ds. No h/o cryptorchidism He grew and went through puberty after his peers - college + mild shrinking of testes. No very small testes (<5 ml).  No incomplete/delayed sexual development     No breast discomfort/+ gynecomastia (since he was on dialysis).    No loss of body hair (axillary/pubic)/decreased need for shaving + height loss - 2 inches Lately + abnormal sense of smell  + hot flushes when his blood sugars are low No vision problems No worst HA of his life No FH of hypogonadism/infertility  No personal h/o infertility - has children No FH of hemochromatosis or pituitary tumors No excessive weight gain or loss.  No chronic pain. Not on opiates, but Tramadol bid >> now off. He does not take steroids.  No alcohol No anabolic steroids use No herbal medicines + antidepressants: Lexapro No AI ds in his family.  He sees urology.  Dr. Kathlen Mody. He has OSA-on CPAP He had a rectal fistula surgery at the beginning of 2021. He takes a vitamin D supplement. B12 vitamin was on 01/05/2021: Normal, at 852  ROS: + see HPI Neurological: + tremors (chronic, from Prograft)/+ numbness/+ tingling/no dizziness  I reviewed pt's medications, allergies, PMH, social hx, family hx, and changes were documented in the history of present illness. Otherwise, unchanged from my initial visit note.  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     Continuous Blood Gluc Sensor (DEXCOM G6 SENSOR) MISC Use as instructed to check blood sugar. Change every 10 days 9 each 3   Continuous Blood Gluc Transmit (DEXCOM G6 TRANSMITTER) MISC Use as instructed. Change every 90 days 1 each 3   escitalopram (LEXAPRO) 10 MG tablet Take 10 mg by mouth 2 (two) times daily.     FIASP 100 UNIT/ML SOLN USE UP TO 50 UNITS VIA PUMP DAILY 40 mL 1   Glucagon (BAQSIMI TWO PACK) 3 MG/DOSE POWD Place 3 mg into the nose once as needed for up to 1 dose. 1 each 11   Insulin Disposable Pump (OMNIPOD 5 G6 INTRO, GEN 5,) KIT 1 each by Does not apply route as needed. 1 kit 0   Insulin Disposable Pump (OMNIPOD 5 G6 POD, GEN 5,) MISC 1 each by Does not apply route every 3 (three) days. 30 each 3   levothyroxine (SYNTHROID) 75 MCG tablet TAKE 1 TABLET(75 MCG) BY MOUTH DAILY 90 tablet 3   Multiple Vitamins-Minerals (MULTIVITAMINS THER. W/MINERALS) TABS Take 1 tablet by mouth daily.     ondansetron (ZOFRAN) 4 MG tablet Take 1 tablet (4 mg total) by mouth every 8 (eight) hours as needed for nausea or vomiting. (Patient not taking: Reported on 02/12/2021) 15 tablet 0   SYRINGE/NEEDLE, DISP, 1 ML 23G X 1" 1 ML MISC Use every week 50 each 1   tacrolimus (PROGRAF) 1 MG capsule Take 3 mg by mouth 2 (two) times daily.     testosterone cypionate (DEPOTESTOSTERONE CYPIONATE) 200 MG/ML injection INJECT 0.25 MLS INTO THE MUSCLE ONCE  A WEEK 3 mL 3   traMADol (ULTRAM) 50 MG tablet Take 50-100 mg by mouth every 6 (six) hours as needed for pain. (Patient not taking: No sig reported)     TUBERCULIN SYR 1CC/25GX5/8" (B-D TB SYRINGE 1CC/25GX5/8") 25G X 5/8" 1 ML MISC USE 1 EVERY 2 WEEKS 8 each 12   Vitamin D, Ergocalciferol, (DRISDOL) 1.25 MG (50000 UT) CAPS capsule Take 50,000 Units by mouth every Saturday.  (Patient not taking: Reported on 02/12/2021)     No current facility-administered medications on file prior to visit.    Allergies  Allergen Reactions   Tomato Nausea And Vomiting    Immediate vomiting    Other Nausea And Vomiting    RAW Canteloupe, banana, tomatoes, all raw vegetables  Unless the vegetables have dressing on them.  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   Codeine Nausea And Vomiting   Metoclopramide Hcl Nausea And Vomiting   Family History  Problem  Relation Age of Onset   Stroke Mother    Lymphoma Father    Diabetes type II Father    PE: There were no vitals taken for this visit. Wt Readings from Last 3 Encounters:  12/10/21 196 lb 12.8 oz (89.3 kg)  06/09/21 212 lb 9.6 oz (96.4 kg)  02/12/21 211 lb (95.7 kg)   Constitutional: overweight, in NAD Eyes: EOMI, no exophthalmos ENT: no thyromegaly, no cervical lymphadenopathy Cardiovascular: RRR, No MRG Respiratory: CTA B Musculoskeletal: no deformities, Skin:  no rashes Neurological: + tremor with outstretched hands  ASSESSMENT: 1. DM1, uncontrolled, without complications - h/o DKA - mild gastroparesis - proliferative retinopathy - ESRD -history of being on HD and peritoneal dialysis for 5 years - PN  2. Hashimoto's thyroiditis  3.  History of hypogonadotropic hypogonadism  4. Low IGF1 -He had a low IGF-I.  Pituitary work-up with a pituitary MRI did not not reveal any tumor.  -This is most likely related to hypogonadism and hyperglycemia/uncontrolled diabetes -No further investigation needed for this  PLAN:  1.  Patient with longstanding, uncontrolled, type 1 diabetes, previously on the OmniPod DASHinsulin pump, but now on OmniPod 5 integrated with the Dexcom G6 CGM since 12/2020.    At last visit, sugars remain mostly high during the night and they were increasing after every meal, especially lunch and dinner.  He was not entering carbs consistently and when he did, he entered only a small amount (15 g) as he was afraid of dropping his sugars postprandially.  We did discuss about the importance of entering carbs and taking Fiasp before every meal, to be able to adjust his insulin to carb ratios, if needed.  I advised him that if he needed to take the insulin after the meal, to enter only 50% of the carbs eaten for that meal.  Also, if he has to bolus 1 to 2 hours after the meal, I advised him to only do a correction, and not enter any carbs, to prevent hypoglycemia.  Even with these shortcomings, his HbA1c was better that he had in a long while, at last visit, due to the closed-loop nature of his insulin delivery system. CGM interpretation: -At today's visit, we reviewed his CGM downloads: It appears that *** of values are in target range (goal >70%), while *** are higher than 180 (goal <25%), and *** are lower than 70 (goal <4%).  The calculated average blood sugar is ***.  The projected HbA1c for the next 3 months (GMI) is ***. -Reviewing the CGM trends, ***  -Reviewing the CGM trends, sugars are uniformly high, fluctuating around the upper limit of the target range or above.  They increase more his 3 meals of the day.  Review of his pump records, he is barely entering any carbs into the pump.  He has consecutive days in which she does not enter any, and when he does, he enters 10-20 g of carbs maybe once a day.  We discussed that this is unacceptable for good diabetes control.  I again strongly advised him to enter carbs every time he eats and bolus for them.  Without doing this, we would not be able to control  his diabetes. -We did discuss about the new iLet insulin pump and discussed about differences with his current pump.  He is very interested in trying this.  However, I advised him that even for this pump, he needs to tell the pump whenever he is eating and enter information about the size  of the meal (small-medium-large), but not worry about carbs.  This is a very new pump and we can try this in the near future, but I would like to get more information about how it works in the real world before starting this for him. -I suggested to: Patient Instructions  Please continue: - basal rates: 12 am: 0.85 5 am: 0.75 1 pm: 1 3 pm: 1 6 pm: 1.2 - ICR  12 am: 1:8  11 am: 1:8 - target: 110-140 - ISF: 12 am: 50 8 pm: 70 - active insulin time: 4h  Please do the following approximately 15 minutes before every meal: - Enter carbs (C) - Enter sugars (S) - Start insulin bolus (I)  Look up the iLet pump.  Please return in 4-6 months.  - we checked his HbA1c: 7%  - advised to check sugars at different times of the day - 4x a day, rotating check times - advised for yearly eye exams >> he is UTD - return to clinic in 4-6 months  2. Hashimoto's hypothyroidism - latest thyroid labs reviewed with pt. >> normal in 08/2021 - he continues on LT4 75 mcg daily - pt feels good on this dose. - we discussed about taking the thyroid hormone every day, with water, >30 minutes before breakfast, separated by >4 hours from acid reflux medications, calcium, iron, multivitamins. Pt. is taking it correctly.  3.  Hypogonadotropic hypogonadism -Found during investigation for low libido.  The total testosterone was normal while SHBG was high and free testosterone was low.  Further investigation pointed towards mild hypogonadotropic hypogonadism.  A pituitary MRI was negative for tumor. He reported previous testicular surgery and a possible leak in the perineum, and since he also had problems initiating urine stream  (possible BPH), I referred him to urology.  He had a benign evaluation and was cleared for testosterone treatment.  PSA and hematocrit levels were normal before starting testosterone.  He started testosterone in 01/2019: 50 mg every 14 days.  However, his testosterone level was low, so I advised him to increase the dose to 50 mg every 7 days.  He misunderstood instructions and was taking 100 mg (0.5 mL of 200 mg/mL) every 2 weeks.  I advised him to take 0.25 mL every week (50 mg) he forgot and he was still taking 0.5 mL every 2 weeks and mentioned that this was working better for him.  We continued the same dose.  -He initially felt better after starting testosterone, with improved libido and energy, but he then felt that he was more irritable and aggressive and this put strain on his marriage.  He actually stopped his testosterone in 09/2021 and felt better and calmer afterwards. -At our last visit, we checked a testosterone level and it was normal, 2 months of the supplement. -No further investigation is needed for this   Philemon Kingdom, MD PhD [facility]

## 2022-07-18 ENCOUNTER — Other Ambulatory Visit: Payer: Self-pay | Admitting: Internal Medicine

## 2022-07-18 DIAGNOSIS — E10319 Type 1 diabetes mellitus with unspecified diabetic retinopathy without macular edema: Secondary | ICD-10-CM

## 2022-08-24 ENCOUNTER — Other Ambulatory Visit (HOSPITAL_COMMUNITY): Payer: Self-pay | Admitting: Nephrology

## 2022-08-24 DIAGNOSIS — N179 Acute kidney failure, unspecified: Secondary | ICD-10-CM

## 2022-08-25 ENCOUNTER — Ambulatory Visit (HOSPITAL_COMMUNITY)
Admission: RE | Admit: 2022-08-25 | Discharge: 2022-08-25 | Disposition: A | Discharge status: Home / Self Care | Payer: BC Managed Care – PPO | Source: Ambulatory Visit | Attending: Nephrology | Admitting: Nephrology

## 2022-08-25 DIAGNOSIS — N179 Acute kidney failure, unspecified: Secondary | ICD-10-CM | POA: Insufficient documentation

## 2022-11-08 ENCOUNTER — Other Ambulatory Visit: Payer: Self-pay | Admitting: Internal Medicine

## 2022-11-08 DIAGNOSIS — R7989 Other specified abnormal findings of blood chemistry: Secondary | ICD-10-CM

## 2022-12-28 ENCOUNTER — Other Ambulatory Visit: Payer: Self-pay | Admitting: Internal Medicine

## 2022-12-28 DIAGNOSIS — E10319 Type 1 diabetes mellitus with unspecified diabetic retinopathy without macular edema: Secondary | ICD-10-CM

## 2023-01-28 ENCOUNTER — Other Ambulatory Visit: Payer: Self-pay | Admitting: Internal Medicine

## 2023-01-28 DIAGNOSIS — R7989 Other specified abnormal findings of blood chemistry: Secondary | ICD-10-CM

## 2023-04-27 ENCOUNTER — Other Ambulatory Visit: Payer: Self-pay | Admitting: Internal Medicine

## 2023-04-27 DIAGNOSIS — R7989 Other specified abnormal findings of blood chemistry: Secondary | ICD-10-CM

## 2023-05-01 ENCOUNTER — Telehealth: Payer: Self-pay

## 2023-05-01 NOTE — Telephone Encounter (Signed)
 Patient has not been seen since 11/2021 and is requesting refills. I have sent in his PODs but for him to get more refills he needs to be seen.

## 2023-05-01 NOTE — Telephone Encounter (Signed)
 Patient is scheduled for 05/24/2023 at 11:00 AM with Dr. Elvera Lennox.

## 2023-05-24 ENCOUNTER — Encounter: Payer: Self-pay | Admitting: Internal Medicine

## 2023-05-24 ENCOUNTER — Telehealth: Payer: Self-pay

## 2023-05-24 ENCOUNTER — Ambulatory Visit: Payer: 59 | Admitting: Internal Medicine

## 2023-05-24 VITALS — BP 120/70 | HR 83 | Ht 69.0 in | Wt 198.4 lb

## 2023-05-24 DIAGNOSIS — E063 Autoimmune thyroiditis: Secondary | ICD-10-CM

## 2023-05-24 DIAGNOSIS — E10319 Type 1 diabetes mellitus with unspecified diabetic retinopathy without macular edema: Secondary | ICD-10-CM

## 2023-05-24 DIAGNOSIS — R7989 Other specified abnormal findings of blood chemistry: Secondary | ICD-10-CM

## 2023-05-24 LAB — POCT GLYCOSYLATED HEMOGLOBIN (HGB A1C): Hemoglobin A1C: 7.2 % — AB (ref 4.0–5.6)

## 2023-05-24 MED ORDER — DEXCOM G6 TRANSMITTER MISC: 3 refills | Status: DC

## 2023-05-24 MED ORDER — OMNIPOD 5 DEXG7G6 PODS GEN 5 MISC: 3 refills | Status: DC

## 2023-05-24 MED ORDER — FIASP 100 UNIT/ML IJ SOLN: INTRAMUSCULAR | 1 refills | Status: DC

## 2023-05-24 MED ORDER — DEXCOM G6 SENSOR MISC: 3 refills | Status: DC

## 2023-05-24 NOTE — Patient Instructions (Addendum)
Please continue: - basal rates: 12 am: 0.85 5 am: 0.75 1 pm: 1 - ICR  12 am: 1:8  - target: 110-140 - ISF: 12 am: 50 8 pm: 70 - active insulin time: 4h  Change targets: - 12 am-9 am: 110 - 9 am-12 am: 130  Please do the following before every meal: - Enter carbs (C) - Enter sugars (S) - Start insulin bolus (I)  Look up the iLet pump.  Please stop at the lab.  Please return in 4-6 months.

## 2023-05-24 NOTE — Telephone Encounter (Signed)
Orders Placed This Encounter  Procedures   TSH    Standing Status:   Future    Expected Date:   05/24/2023    Expiration Date:   05/23/2024   Lipid Panel With LDL/HDL Ratio    Standing Status:   Future    Expected Date:   05/24/2023    Expiration Date:   05/23/2024

## 2023-05-24 NOTE — Progress Notes (Signed)
Patient ID: Daniel Hill, male   DOB: [birth year], 48 y.o.   MRN: [id number]   HPI: Daniel Hill is a 48 y.o.-year-old male, initially referred by his nephrologist, Dr. Darrick Penna, presenting for follow-up for DM1, uncontrolled, with complications (DKA, mild gastroparesis, proliferative retinopathy, ESRD -history of being on HD and peritoneal dialysis for 5 years, PN). Last visit 1 year and 5 months ago.  Interim history: He continues to be busy at work (works at [employer]). No increased urination, blurry vision. 2 months prior to our last visit, he contacted our office (I was out) about increased aggression on testosterone.  He was interested in stopping it.  My colleague advised him to do so.  At last visit he was off testosterone, feeling calmer.  He remains off testosterone and would not want to restart for now Since last visit, his gastroparesis worsened to the point of vomiting several times a day.  He was seen by GI and had several studies performed.  He is on Erythromycin and feeling better. He started to eat less meat, also tries to reduce potassium - on Veltassa.  DM1: Reviewed history: Patient has been diagnosed with diabetes at 48 y/o. She was prev. Seen by [facility] endocrinology.  She is status post kidney-pancreatic transplant in 2003, but his pancreas transplant failed in 2013.  He is considered too high risk for a new pancreatic transplant.  His retinopathy and gastroparesis improved after transplant.  He had several eye surgeries (vitrectomy and scleral buckle procedures).  He has chronic blindness in his left eye.  Reviewed HbA1c levels: Lab Results  Component Value Date   HGBA1C 8.4 (A) 12/10/2021   HGBA1C 7.6 (A) 06/09/2021   HGBA1C 8.9 (A) 11/02/2020   HGBA1C 10.1 (H) 07/21/2020   HGBA1C 9.8 (A) 07/16/2020   HGBA1C 9.2 (A) 03/16/2020   HGBA1C 8.7 (A) 04/03/2019   HGBA1C 9.7 (A) 12/28/2018   HGBA1C 8.9 (A) 03/21/2018   HGBA1C 9.9 (H) 05/19/2011  01/01/2020: HbA1c  9.1% 07/15/2016: HbA1c 7.8%  He was on the previous regimen: - Tresiba 10 units at bedtime - Humalog - ICR 1:15, target 120, ISF 40 - injects after meals (ends up with 2-6 units per meal)  Now on an insulin pump:  -Omnipod DASH  Pump -started OmniPod 5 - started 12/2020  CGM: -Prev. On freestyle libre 2, but had problems with the sensor coming off -on Dexcom G6 -he prefers to continue with this rather than switching to G7 due to connectivity issues  Insulin: -Previously NovoLog in the pump -Now Fiasp  Pump settings  - basal rates: 12 am: 0.8 >> 0.85 5 am: 0.8 >> 0.75 1 pm: 1 3 pm: 0.9 >> 1 6 pm: 1.1 >> 1.2 >>  1.0 - ICR  12 am: 1:7 >> 8  11 am: 1:7  >> 8 - target: 110-120 >> 110-140 - ISF: 12 am: 50 8 pm: 70 - active insulin time: 4h TDD from basal insulin: ~11 >> 17-19 units/day >> 57% >> 47% (18.5 units) >> 69% (22.1 units) TDD from bolus insulin: ~21 units >> 4-14 units/day >> 43% >> 53% >> 31%.  9.9 units) Total daily dose 35-60 >> 32-60 units a day    He checks his blood sugars more than 4 times a day with his Dexcom CGM:  Previously:   Previously:   Lowest sugar was 40 >> 59; he has hypoglycemia awareness in the 60s.  No previous hypoglycemia admission.  He has an unexpired glucagon kit at  home. Highest sugar was 500 >> 300s >> HI.  He has a distant history of DKA admission-before his renal transplant.  Pt's meals are: - Breakfast: eggs or oatmeal - Lunch: meat and veggies, sandwich, soup - Dinner: mostly meat and veggies, sometimes pasta and bread - Snacks: 2  - usually apples or beef jerky.  -He has a history of ESRD, now status post renal transplant- Dr. Darrick Penna >> Dr Allena Katz, last BUN/creatinine:   Lab Results  Component Value Date   BUN 13 07/28/2018   BUN 16 05/02/2018   CREATININE 0.95 07/28/2018   CREATININE 0.97 05/02/2018   Lab Results  Component Value Date   MICRALBCREAT 1.3 11/02/2020   -+ HL; last set of lipids: Lab Results   Component Value Date   CHOL 150 12/10/2021   HDL 51.80 12/10/2021   LDLCALC 87 12/10/2021   TRIG 57.0 12/10/2021   CHOLHDL 3 12/10/2021  07/31/2019: 162/94/42/102 He takes fish oil - occasionally.  He declined statins.  - last eye exam was in 2024: stable + DR; he is [facility]. Dr. Gwendalyn Ege.  -He does have numbness and tingling in his feet.  Last foot exam 05/2021.  He was previously on Reglan and he has intolerance to this. Now on Erythromycin.  Pt has FH of DM in father.  Hashimoto's thyroiditis:  Pt is on levothyroxine 75 mcg daily, taken: - in am - fasting - at least 30 min from b'fast - no calcium - no iron - no multivitamins - + Added PPIs since last visit-taken twice a day, first dose along with LT4! - not on Biotin  TSH levels were normal: 09/07/2021: TSH 2.56 01/05/2021: TSH 2.11 Lab Results  Component Value Date   TSH 3.660 07/21/2020  06/23/2020: TSH 1.392 01/01/2020: TSH 2.502  Pt denies: - feeling nodules in neck - hoarseness - choking He does have occasional dysphagia, possibly related to dry mouth from his CPAP.  He does have a family history of thyroid cancer.  Hypogonadotropic hypogonadism: -Previously taking testosterone 0.5 mL every 2 weeks, then 0.25 mL every week.  He felt much better, but he had marital strain due to increased aggression.  Therefore, in 09/2021, he stopped testosterone.  He would not want to restart this.  Reviewed history:  He had a normal total testosterone with a low free testosterone.   No signs of hemochromatosis.   His HIV test was nonreactive.   He had a normal PSA, normal ECG and prolactin, normal cortisol.   He had a low IGF-I, also.  Pertinent labs were reviewed: 09/07/2021: Hemoglobin/hematocrit 13.2 (14-17.5)/39.4% (41.5-50.4%)  Component     Latest Ref Rng & Units 11/02/2020  Testosterone, Serum (Total)     ng/dL 161  % Free Testosterone     % 0.6  Free Testosterone, S     pg/mL 40 (L)  Sex Hormone  Binding Globulin     nmol/L 75.3 (H)  Free testosterone calculated with the Omni equation, was normal, at 83. ...  Component     Latest Ref Rng & Units 11/09/2018 12/12/2018  Testosterone, Serum (Total)     ng/dL 096 045  % Free Testosterone     % 0.5 0.5  Free Testosterone, S     pg/mL 25 (L) 34 (L)  Sex Hormone Binding Globulin     nmol/L 111.0 (H) 136.5 (H)  Estradiol     pg/mL  29  Free Estradiol, Percent     %  1.3  Free  Estradiol, Serum     pg/mL  0.38  hCG Quant     0 - 3 mIU/mL  <1  Prolactin     4.0 - 15.2 ng/mL  8.0  LH     1.7 - 8.6 mIU/mL  4.9  FSH     1.5 - 12.4 mIU/mL  3.6  Insulin-Like GF-1     84 - 270 ng/mL  43 (L)  Prostate Specific Ag, Serum     0.0 - 4.0 ng/mL  0.8  Cortisol     ug/dL  13.0   Pituitary MRI (01/18/2019): Normal pituitary MRI.  No evidence of adenoma or other abnormality.  We started him on testosterone: 01/2019: 50 mg every 2 weeks   03/2019: Testosterone level was low >> I advised him to increase the dose to 50 mg every week. However, he is was taking 100 mg (0.5 mL) every 2 weeks. 06/2019: I recommended testosterone to 50 mg (0.25 mL) every week >> however, he continued 0.5 mL every 2 weeks  06/2020: He finally started 0.25 ml q 1 week >> feels much better  Reviewed pertinent history:  He remembered being found to have a low testosterone approximately 10 years ago.  He was tried on testosterone gel and then, but came off afterwards.  He then saw Dr. Evlyn Kanner who also checked the testosterone and patient was told that this was normal.    His wife got pregnant after [facility] tx [facility]. Pt's sperm count was reportedly normal, but on a lower side. He admits for decreased libido Has no  difficulty obtaining but sometimes has problems maintaining an erection No trauma to testes, testicular irradiation but had surgery for L hydrocele.  He mentions that after the surgery he developed a perineal leak (between scrotum and the  rectum). He also has problems initiating urine flow  No h/o of mumps orchitis/h/o autoimmune ds. No h/o cryptorchidism He grew and went through puberty after his peers - college + mild shrinking of testes. No very small testes (<5 ml).  No incomplete/delayed sexual development     No breast discomfort/+ gynecomastia (since he was on dialysis).    No loss of body hair (axillary/pubic)/decreased need for shaving + height loss - 2 inches Lately + abnormal sense of smell  + hot flushes when his blood sugars are low No vision problems No worst HA of his life No FH of hypogonadism/infertility  No personal h/o infertility - has children No FH of hemochromatosis or pituitary tumors No excessive weight gain or loss.  No chronic pain. Not on opiates, but Tramadol bid >> now off. He does not take steroids.  No alcohol No anabolic steroids use No herbal medicines + antidepressants: Lexapro No AI ds in his family.  He saw urology.  Dr. Alben Spittle. He has OSA-on CPAP He had a rectal fistula surgery at the beginning of 2021.  ROS: + see HPI Neurological: + tremors (chronic, from Prograft)/+ numbness/+ tingling/no dizziness  I reviewed pt's medications, allergies, PMH, social hx, family hx, and changes were documented in the history of present illness. Otherwise, unchanged from my initial visit note.  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     Continuous Blood Gluc Sensor (DEXCOM G6 SENSOR) MISC Use as instructed to check blood sugar. Change every 10 days 9 each 3   Continuous Blood Gluc Transmit (DEXCOM G6 TRANSMITTER) MISC Use as instructed. Change every 90 days 1 each 3   escitalopram (LEXAPRO) 10 MG tablet Take 10 mg by mouth 2 (two) times daily.     Glucagon (BAQSIMI TWO PACK) 3 MG/DOSE POWD Place 3 mg into the nose once as needed for up to 1 dose. 1 each 11   Insulin Aspart, w/Niacinamide, (FIASP) 100 UNIT/ML SOLN INJECT UP TO 50 UNITS VIA INSULIN PUMP DAILY 40 mL 1   Insulin Disposable Pump (OMNIPOD 5 DEXG7G6 PODS GEN 5) MISC AS DIRECTED EVERY 3 DAYS 30 each 0   Insulin Disposable Pump (OMNIPOD 5 G6 INTRO, GEN 5,) KIT 1 each by Does not apply route as needed. 1 kit 0   levothyroxine (SYNTHROID) 75 MCG tablet TAKE 1 TABLET(75 MCG) BY MOUTH DAILY 90 tablet 3   Multiple Vitamins-Minerals (MULTIVITAMINS THER. W/MINERALS) TABS Take 1 tablet by mouth daily.     ondansetron (ZOFRAN) 4 MG tablet Take 1 tablet (4 mg total) by mouth every 8 (eight) hours as needed for nausea or vomiting. (Patient not taking: Reported on 02/12/2021) 15 tablet 0   SYRINGE/NEEDLE, DISP, 1 ML 23G X 1" 1 ML MISC Use every week 50 each 1   tacrolimus (PROGRAF) 1 MG capsule Take 3 mg by mouth 2 (two) times daily.     testosterone cypionate (DEPOTESTOSTERONE CYPIONATE) 200 MG/ML injection INJECT 0.25 MLS INTO THE MUSCLE ONCE A WEEK 3 mL 3   traMADol (ULTRAM) 50 MG tablet Take 50-100 mg by mouth every 6 (six) hours as needed for pain. (Patient not taking: No sig reported)     TUBERCULIN SYR 1CC/25GX5/8" (B-D TB SYRINGE 1CC/25GX5/8") 25G X 5/8" 1 ML MISC USE 1 EVERY 2 WEEKS 8  each 12   Vitamin D, Ergocalciferol, (DRISDOL) 1.25 MG (50000 UT) CAPS capsule Take 50,000 Units by mouth every Saturday.  (Patient not taking: Reported on 02/12/2021)     No current facility-administered medications on file prior to visit.    Allergies  Allergen Reactions   Tomato Nausea And Vomiting    Immediate vomiting    Other Nausea And Vomiting    RAW Canteloupe, banana, tomatoes, all raw vegetables  Unless the vegetables have dressing on them.  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   Codeine Nausea And Vomiting   Metoclopramide Hcl Nausea And Vomiting   Family History  Problem Relation Age of Onset   Stroke Mother    Lymphoma Father    Diabetes type II Father    PE: BP 120/70   Pulse 83   Ht 5\' 9"  (1.753 m)   Wt 198 lb 6.4 oz (90 kg)  SpO2 99%   BMI 29.30 kg/m  Wt Readings from Last 3 Encounters:  05/24/23 198 lb 6.4 oz (90 kg)  12/10/21 196 lb 12.8 oz (89.3 kg)  06/09/21 212 lb 9.6 oz (96.4 kg)   Constitutional: overweight, in NAD Eyes: EOMI, no exophthalmos ENT: no thyromegaly, no cervical lymphadenopathy Cardiovascular: RRR, No MRG Respiratory: CTA B Musculoskeletal: no deformities, Skin: no rashes Neurological: + tremor with outstretched hands  ASSESSMENT: 1. DM1, uncontrolled, without complications - h/o DKA - mild gastroparesis - proliferative retinopathy - ESRD -history of being on HD and peritoneal dialysis for 5 years - PN  2. Hashimoto's thyroiditis  3.  Hypogonadotropic hypogonadism -saw urology -off testosterone now  4. Low IGF1 -He had a low IGF-I.  Pituitary work-up with a pituitary MRI did not not reveal any tumor.  -This is most likely related to hypogonadism and hyperglycemia/uncontrolled diabetes -No further investigation needed for this  PLAN:  1. Patient with longstanding, uncontrolled, type 1 diabetes, returning after long absence of 1 year and 5 months.  He was previously on the OmniPod Dash insulin pump was  switched to OmniPod 5 integrated with the Dexcom G6 CGM in 12/2020.  At last visit, HbA1c was 8.4%, still above target, and sugars were uniformly high, fluctuating around the upper limit of the target range or above.  They were increasing even more after his meals.  Reviewing his pump records, he was barely entering any carbs into the pump.  He had consecutive days in which he was not entering any carbs but when he did it, he only entered 10 to 20 g of carbs maybe once a day.  We discussed that this is not conducive to good diabetes control.  I again advised him to enter carbs every time he was eating and bolus for them.  I did recommend that he looked into the iLet pump insulin pump for which she only needed to announce the meals, and not enter carbs.  He was very interested in trying this.  However, he was lost for follow-up after our last visit. CGM interpretation: -At today's visit, we reviewed his CGM downloads: It appears that 47% of values are in target range (goal >70%), while 53% are higher than 180 (goal <25%), and 0% are lower than 70 (goal <4%).  The calculated average blood sugar is 199.  The projected HbA1c for the next 3 months (GMI) is 8.1%. -Reviewing the CGM trends, sugars are elevated throughout the day, mostly above target, but increasing to the target range between meals.  He is improving blood sugars overnight but then started to increase with a peak around 3 AM.  He mentions that he frequently stops the pump when the blood sugars are trending down to 110 and we discussed that this is confusing the pump algorithm.  I advised him not to stop the pump to correct the lows if they occur.  However, I also recommended to increase his target during the day (between 9 AM and 12 AM).  Overnight, I did not recommend to increase the target since the sugars are usually trending up as the night goes by. -Repeating individual pump tracings, it appears that he is still not introducing enough carbs into  the pump.  He mentions that he is very afraid of hypoglycemia.  At this visit, as at all over last visit, I explained that until he starts bolusing correctly for all of the meals, we will not be able to control his diabetes.  He is  aware that he needs to enter 50% of the proteins as carbs into the pump if he has a low-carb meal.  We again discussed about how to do so and how to calculate the total amount of carbs.  I introduced the pump changes into the pump for him. -He inquires about new diabetic devices on the market -we again discussed about the iLet pump in situations in which this pump would be preferred.  In his case,since he has not been successful in bolusing correctly before meals and trusting the pump, I feel that he would be a good candidate for this pump. We discussed how this pump differs from his current pump.  However, this pump has tubing and he would prefer a tubeless pump as he has small children at home and they may pull on the tubes. I advised him that the new tubeless pump is out, but it does not integrate with CGMs in the closed loop mode yet, although this is planned. However, without bolusing for meals, I do not believe that changing the pump would help for now.  He mentions that he already started to change his diet and the next thing that he will do is working on bolusing before meals.  He does have gastroparesis but for now, on erythromycin, he feels better, so I am hoping that this will not continue to impede his diabetes control. -At today's visit I refilled his pump pods, sensors, and insulin  -I suggested to: Patient Instructions  Please continue: - basal rates: 12 am: 0.85 5 am: 0.75 1 pm: 1 - ICR  12 am: 1:8 - ISF: 12 am: 50 8 pm: 70 - active insulin time: 4h  Change targets: - 12 am-9 am: 110 - 9 am-12 am: 130  Please do the following before every meal: - Enter carbs (C) - Enter sugars (S) - Start insulin bolus (I)  Look up the iLet pump.  Please stop at  the lab.  Please return in 4-6 months.  - we checked his HbA1c: 7.2% (lower) - advised to check sugars at different times of the day - 4x a day, rotating check times - advised for yearly eye exams >> he is UTD - he is due for lipid panel-will check at next lab draw-I advised him to have this checked fasting - return to clinic in 4-6 months  2. Hashimoto's hypothyroidism - latest thyroid labs reviewed with pt. >> normal in 2023 - he continues on LT4 75 mcg daily - pt feels good on this dose. - we discussed about taking the thyroid hormone every day, with water, >30 minutes before breakfast, separated by >4 hours from acid reflux medications, calcium, iron, multivitamins. Pt. was taking it correctly, but since last visit, he was started on omeprazole bid and he takes the first dose along with levothyroxine.  I advised him to move omeprazole at least 4 hours after levothyroxine. -Plan to check a TSH approximately 1.5 mo after the above change.  3.  Hypogonadotropic hypogonadism -Found during investigation for low libido.  The total testosterone was normal while SHBG was high and free testosterone was low.  Further investigation pointed towards mild hypogonadotropic hypogonadism.  A pituitary MRI was negative for tumor. He reported previous testicular surgery and a possible leak in the perineum, and since he also had problems initiating urine stream (possible BPH), I referred him to urology.  He had a benign evaluation and was cleared for testosterone treatment.  PSA and hematocrit levels were normal before  starting testosterone.  He started testosterone in 01/2019: 50 mg every 14 days.  However, his testosterone level was low, so I advised him to increase the dose to 50 mg every 7 days.  He misunderstood instructions and was taking 100 mg (0.5 mL of 200 mg/mL) every 2 weeks.  I advised him to take 0.25 mL every week (50 mg) he forgot and he was still taking 0.5 mL every 2 weeks and mentioned that  this was working better for him.  We continued the same dose.  -Overall, he felt better after starting testosterone improved libido and energy.  He then started to have hot flashes and he had a slightly low testosterone at his visit with PCP.  However, at that time, he could not remember exactly when the labs were drawn (whether they were drawn in the morning or halfway between injections).  He finally moved the testosterone to every week (0.25 mL, 50 mg) and he felt that this helped a lot. At last check, the calculated free testosterone level was normal, at 83.  We did not change the regimen. -However, he mentioned that he developed aggression on testosterone and an stopping it 2 months ago.  He feels more calm now, but we discussed that this is not an all or nothing situation, we can try a lower dose of testosterone.  We also discussed about other different formulations which are not subjected to significant peaks and troughs, as IM testosterone.  Of note, he also dreaded giving himself the injections, due to problems finding the right needles and that the injections themselves.  He is not willing to retry a testosterone gel.  However, we discussed about intranasal testosterone and I advised him that this is usually taken 3 times a day, which would be an intense regimen.  Also, we discussed about the oral testosterone, which, has blood pressure increases the side effects so we have to be careful with this.  I have not prescribed this so far and we discussed about him trying to discuss with urology (Dr. Alben Spittle, whom he currently sees) and see if they would be comfortable prescribing it for him.  He was interested in this. -I referred him to urology for further management -However, 2 months prior to our last visit he came off testosterone due to increased aggression.  He felt better after stopping it.  Orders Placed This Encounter  Procedures   Lipid Panel w/reflex Direct LDL   TSH   POCT glycosylated  hemoglobin (Hb A1C)   Needs LT4 refills.  - Total time spent for the visit: 40 min (outside CGM interpretation), in precharting, postcharting, reviewing outside records (Care Everywhere), obtaining medical information from the Epic chart and from the pt, reviewing his  previous labs, evaluations, and treatments, reviewing his symptoms, counseling him about his endocrine conditions (please see the discussed topics above), and developing a plan to further investigate and treat them; he had a number of questions which I addressed.  Carlus Pavlov, MD PhD [facility] Endocrinology

## 2023-07-14 ENCOUNTER — Encounter: Payer: Self-pay | Admitting: Internal Medicine

## 2023-08-30 ENCOUNTER — Encounter: Payer: Self-pay | Admitting: Internal Medicine

## 2023-08-31 ENCOUNTER — Telehealth: Payer: Self-pay

## 2023-08-31 ENCOUNTER — Other Ambulatory Visit (HOSPITAL_COMMUNITY): Payer: Self-pay

## 2023-08-31 NOTE — Telephone Encounter (Signed)
 Pharmacy Patient Advocate Encounter  Received notification from [facility] that Prior Authorization for Dexcom G6 Sensor has been APPROVED from 08-31-2023 to 08-30-2024   PA #/Case ID/Reference #: [id number]

## 2023-08-31 NOTE — Telephone Encounter (Signed)
 Pt needs a PA for Dexcom G6 sensors and transmitters

## 2023-08-31 NOTE — Telephone Encounter (Signed)
 Pharmacy Patient Advocate Encounter   Received notification from Pt Calls Messages that prior authorization for Dexcom G6 sensor is required/requested.   Insurance verification completed.   The patient is insured through [facility] .   Per test claim: PA required; PA submitted to above mentioned insurance via CoverMyMeds Key/confirmation #/[id number] Status is pending

## 2023-10-09 ENCOUNTER — Ambulatory Visit: Payer: 59 | Admitting: Internal Medicine

## 2023-10-09 NOTE — Progress Notes (Deleted)
 Patient ID: Daniel Hill, male   DOB: 11-10-1975, 48 y.o.   MRN: [id number]   HPI: Daniel Hill is a 48 y.o.-year-old male, initially referred by his nephrologist, Dr. Rolande Cleverly, presenting for follow-up for DM1, uncontrolled, with complications (DKA, mild gastroparesis, proliferative retinopathy, ESRD -history of being on HD and peritoneal dialysis for 5 years, PN).  Last visit 4.5 months ago.  Interim history: He continues to be busy at work (works at [employer]). No increased urination, blurry vision. Before last visit, his gastroparesis worsened to the point of vomiting several times a day.  He was seen by GI and had several studies performed.  He is on Erythromycin and feeling better. He started to eat less meat, also tries to reduce potassium - on Veltassa.  DM1: Reviewed history: Patient has been diagnosed with diabetes at 48 y/o. She was prev. Seen by [facility] endocrinology.  She is status post kidney-pancreatic transplant in 2003, but his pancreas transplant failed in 2013.  He is considered too high risk for a new pancreatic transplant.  His retinopathy and gastroparesis improved after transplant.  He had several eye surgeries (vitrectomy and scleral buckle procedures).  He has chronic blindness in his left eye.  Reviewed HbA1c levels: Lab Results  Component Value Date   HGBA1C 7.2 (A) 05/24/2023   HGBA1C 8.4 (A) 12/10/2021   HGBA1C 7.6 (A) 06/09/2021   HGBA1C 8.9 (A) 11/02/2020   HGBA1C 10.1 (H) 07/21/2020   HGBA1C 9.8 (A) 07/16/2020   HGBA1C 9.2 (A) 03/16/2020   HGBA1C 8.7 (A) 04/03/2019   HGBA1C 9.7 (A) 12/28/2018   HGBA1C 8.9 (A) 03/21/2018   HGBA1C 9.9 (H) 05/19/2011  01/01/2020: HbA1c 9.1% 07/15/2016: HbA1c 7.8%  He was on the previous regimen: - Tresiba 10 units at bedtime - Humalog  - ICR 1:15, target 120, ISF 40 - injects after meals (ends up with 2-6 units per meal)  Now on an insulin  pump:  -Omnipod DASH  Pump -started OmniPod 5 - started 12/2020  CGM: -Prev.  On freestyle libre 2, but had problems with the sensor coming off -on Dexcom G6 -he prefers to continue with this rather than switching to G7 due to connectivity issues  Insulin : -Previously NovoLog  in the pump -Now Fiasp   Pump settings  - basal rates: 12 am: 0.8 >> 0.85 5 am: 0.8 >> 0.75 1 pm: 1 3 pm: 0.9 >> 1 6 pm: 1.1 >> 1.2 >>  1 - ICR  12 am: 1:7 >> 8  11 am: 1:7  >> 8 - target: 110-120 >> 110-140 >>  12 am-9 am: 110 9 am-12 am: 130 - ISF: 12 am: 50 8 pm: 70 - active insulin  time: 4h TDD from basal insulin : ~11 >> 17-19 units/day >> 57% >> 47% (18.5 units) >> 69% (22.1 units) TDD from bolus insulin : ~21 units >> 4-14 units/day >> 43% >> 53% >> 31%.  9.9 units) Total daily dose 35-60 >> 32-60 units a day    He checks his blood sugars more than 4 times a day with his Dexcom CGM:  Previously:  Previously:   Lowest sugar was 40 >> 59; he has hypoglycemia awareness in the 60s.  No previous hypoglycemia admission.  He has an unexpired glucagon  kit at home. Highest sugar was 500 >> 300s >> HI.  He has a distant history of DKA admission-before his renal transplant.  Pt's meals are: - Breakfast: eggs or oatmeal - Lunch: meat and veggies, sandwich, soup - Dinner: mostly meat and veggies, sometimes  pasta and bread - Snacks: 2  - usually apples or beef jerky.  -He has a history of ESRD, now status post renal transplant-seeing nephrology>> Dr Lydia Sams, last BUN/creatinine:  07/13/2023: 41/1.81, GFR 46, glucose 109 Lab Results  Component Value Date   BUN 13 07/28/2018   BUN 16 05/02/2018   CREATININE 0.95 07/28/2018   CREATININE 0.97 05/02/2018   Lab Results  Component Value Date   MICRALBCREAT 1.3 11/02/2020   -+ HL; last set of lipids: Lab Results  Component Value Date   CHOL 150 12/10/2021   HDL 51.80 12/10/2021   LDLCALC 87 12/10/2021   TRIG 57.0 12/10/2021   CHOLHDL 3 12/10/2021  He takes fish oil - occasionally.  He declined statins.  - last eye exam was in  2024: stable + DR; he is seen at [facility]. Dr. Winn Havens.  -He does have numbness and tingling in his feet.  Last foot exam 05/24/2023.  He was previously on Reglan and he has intolerance to this. Now on Erythromycin.  Pt has FH of DM in father.  Hashimoto's thyroiditis:  Pt is on levothyroxine  75 mcg daily, taken: - in am - fasting - at least 30 min from b'fast - no calcium - no iron - no multivitamins - + Added PPIs since last visit-taken twice a day, first dose along with LT4! - not on Biotin  TSH levels were normal: 07/13/2023: TSH 2.839 09/07/2021: TSH 2.56 01/05/2021: TSH 2.11 Lab Results  Component Value Date   TSH 3.660 07/21/2020  06/23/2020: TSH 1.392 01/01/2020: TSH 2.502  Pt denies: - feeling nodules in neck - hoarseness - choking He does have occasional dysphagia, possibly related to dry mouth from his CPAP.  He does have a family history of thyroid  cancer.  Hypogonadotropic hypogonadism: -Previously taking testosterone  0.5 mL every 2 weeks, then 0.25 mL every week.  He felt much better, but he had marital strain due to increased aggression.  Therefore, in 09/2021, he stopped testosterone .  He would not want to restart this.  Please see my previous notes for further details.  He saw urology.  Dr. Reyne Cave. He has OSA-on CPAP He had a rectal fistula surgery at the beginning of 2021.  ROS: + see HPI Neurological: + tremors (chronic, from Prograft)/+ numbness/+ tingling/no dizziness  I reviewed pt's medications, allergies, PMH, social hx, family hx, and changes were documented in the history of present illness. Otherwise, unchanged from my initial visit note.  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     Continuous Glucose Sensor (DEXCOM G6 SENSOR) MISC Use as instructed to check blood sugar. Change every 10 days 9 each 3   Continuous Glucose Transmitter (DEXCOM G6 TRANSMITTER) MISC Use as instructed. Change every 90 days 1 each 3   escitalopram (LEXAPRO) 10 MG tablet Take 10 mg by mouth 2 (two) times daily. (Patient not taking: Reported on 05/24/2023)     Glucagon  (BAQSIMI  TWO PACK) 3 MG/DOSE POWD Place 3 mg into the nose once as needed for up to 1 dose. 1 each 11   Insulin  Aspart, w/Niacinamide, (FIASP ) 100 UNIT/ML SOLN INJECT UP TO 50 UNITS VIA INSULIN  PUMP DAILY 40 mL 1   Insulin  Disposable Pump (OMNIPOD 5 DEXG7G6 PODS GEN 5) MISC Use 1 pod every 3 days 30 each 3   levothyroxine  (SYNTHROID ) 75 MCG tablet TAKE 1 TABLET(75 MCG) BY MOUTH DAILY 90 tablet 3   Multiple Vitamins-Minerals (MULTIVITAMINS THER. W/MINERALS) TABS Take 1 tablet by mouth daily.     ondansetron  (ZOFRAN ) 4 MG tablet Take 1 tablet (4 mg total) by mouth every 8 (eight) hours as needed for nausea or vomiting. 15 tablet 0   SYRINGE/NEEDLE, DISP, 1 ML 23G X 1 1 ML MISC Use every week 50 each 1   tacrolimus  (PROGRAF ) 1 MG capsule Take 3 mg by mouth 2 (two) times daily.     testosterone  cypionate (DEPOTESTOSTERONE CYPIONATE) 200 MG/ML injection INJECT 0.25 MLS INTO THE MUSCLE ONCE A WEEK (Patient not taking: Reported on 05/24/2023) 3 mL 3   traMADol  (ULTRAM) 50 MG tablet Take 50-100 mg by mouth every 6 (six) hours as needed for pain.     TUBERCULIN SYR 1CC/25GX5/8 (B-D TB SYRINGE 1CC/25GX5/8) 25G X 5/8 1 ML MISC USE 1 EVERY 2 WEEKS 8 each 12   Vitamin D , Ergocalciferol , (DRISDOL) 1.25 MG (50000 UT) CAPS capsule Take 50,000 Units by mouth every Saturday.     No current facility-administered medications on file prior to visit.    Allergies  Allergen Reactions   Tomato Nausea And Vomiting    Immediate vomiting    Other Nausea And Vomiting    RAW Canteloupe, banana, tomatoes, all raw vegetables  Unless the vegetables have dressing on them.  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   Codeine Nausea And Vomiting   Metoclopramide Hcl Nausea And Vomiting   Family History  Problem Relation Age of Onset   Stroke Mother    Lymphoma Father    Diabetes type II Father    PE: There were no vitals taken for this visit. Wt Readings from Last 3 Encounters:  05/24/23 198 lb 6.4 oz (90 kg)  12/10/21 196 lb 12.8 oz (89.3 kg)  06/09/21 212 lb 9.6 oz (96.4 kg)   Constitutional: overweight, in NAD Eyes: EOMI, no exophthalmos ENT: no thyromegaly, no cervical lymphadenopathy Cardiovascular: RRR, No MRG Respiratory: CTA B Musculoskeletal: no deformities, Skin: no rashes Neurological: + tremor with outstretched hands  ASSESSMENT: 1. DM1, uncontrolled, without complications - h/o DKA - mild gastroparesis - proliferative retinopathy - ESRD -history of being on HD and peritoneal dialysis for 5 years - PN  2. Hashimoto's thyroiditis  3.  Hypogonadotropic hypogonadism -off testosterone  now -Found during investigation for low libido.  The total testosterone  was normal while SHBG was high and free testosterone  was low.  Further investigation pointed towards mild hypogonadotropic hypogonadism.  A pituitary MRI was negative for tumor. He reported previous testicular surgery and a possible leak in the perineum, and since  he also had  problems initiating urine stream (possible BPH), I referred him to urology.  He had a benign evaluation and was cleared for testosterone  treatment.  PSA and hematocrit levels were normal before starting testosterone .  He started testosterone  in 01/2019: 50 mg every 14 days.  However, his testosterone  level was low, so I advised him to increase the dose to 50 mg every 7 days.  He misunderstood instructions and was taking 100 mg (0.5 mL of 200 mg/mL) every 2 weeks.  I advised him to take 0.25 mL every week (50 mg) he forgot and he was still taking 0.5 mL every 2 weeks and mentioned that this was working better for him.  We continued the same dose.  -Overall, he felt better after starting testosterone  improved libido and energy.  He then started to have hot flashes and he had a slightly low testosterone  at his visit with PCP.  However, at that time, he could not remember exactly when the labs were drawn (whether they were drawn in the morning or halfway between injections).  He finally moved the testosterone  to every week (0.25 mL, 50 mg) and he felt that this helped a lot. At last check, the calculated free testosterone  level was normal, at 83.  We did not change the regimen. -However, he mentioned that he developed aggression on testosterone  and an stopping it 2 months ago.  He feels more calm now, but we discussed that this is not an all or nothing situation, we can try a lower dose of testosterone .  We also discussed about other different formulations which are not subjected to significant peaks and troughs, as IM testosterone .  Of note, he also dreaded giving himself the injections, due to problems finding the right needles and that the injections themselves.  He is not willing to retry a testosterone  gel.  However, we discussed about intranasal testosterone  and I advised him that this is usually taken 3 times a day, which would be an intense regimen.  Also, we discussed about the oral testosterone , which, has  blood pressure increases the side effects so we have to be careful with this.  I have not prescribed this so far and we discussed about him trying to discuss with urology (Dr. Reyne Cave, whom he currently sees) and see if they would be comfortable prescribing it for him.  He was interested in this. -I referred him to urology for further management -However, 2 months prior to our last visit he came off testosterone  due to increased aggression.  He felt better after stopping it and would not want to restart it  4. Low IGF1 -He had a low IGF-I.  Pituitary work-up with a pituitary MRI did not not reveal any tumor.  -This is most likely related to hypogonadism and hyperglycemia/uncontrolled diabetes -No further investigation needed for this  PLAN:  1. Patient with longstanding, uncontrolled, type 1 diabetes, returning at last visit after a long absence of 1 year and 5 months.  HbA1c at that time was actually lower, at 7.2%.  He had another HbA1c obtained 3 months ago (07/13/2023), and this was stable, at 7.2%.  He is on OmniPod 5 integrated with the Dexcom G6 CGM.  At last visit, sugars were elevated throughout the day, mostly above target but in proving to target range between meals.  Sugar was overnight were peaking around 3 AM.  He mentions that he frequently was stopping the pump when the blood sugars were trending down to 110 and we  discussed that this was confusing the pump algorithm.  I advised him not to stop the pump to correct the lows if they occur.  However, I will I recommended to increase the target during the day.  He was not introducing enough carbs into the pump due to continuous fear of hypoglycemia.  We discussed that until he is started bolusing correctly for meals based on carbs, it would be unlikely for his diabetes control to improve.  He had problems with gastroparesis which was also impeding his diabetes control and was on erythromycin at last visit.  He inquired about new diabetic devices  on the market and we discussed about the ilet insulin  pump.  I recommended to look it up and let me know if he wanted to try this.  At that time I refilled his pods, sensors, and insulin . CGM interpretation: -At today's visit, we reviewed his CGM downloads: It appears that 47% of values are in target range (goal >70%), while 53% are higher than 180 (goal <25%), and 0% are lower than 70 (goal <4%).  The calculated average blood sugar is 194.  The projected HbA1c for the next 3 months (GMI) is 7.9%. -Reviewing the CGM trends, sugars are increasing after every meal in a stepwise fashion and they improve overnight, with a nadir around 5-6 AM.  He is not reducing the minimum amount of carbs in the pump, between 215 g, which is not sufficient for meals.  Moreover, he introduces these when the sugars are really high after meals, not before eating, as repeatedly advised.  -I suggested to: Patient Instructions  Please continue: - basal rates: 12 am: 0.85 5 am: 0.75 1 pm: 1 - ICR  12 am: 1:8 - ISF: 12 am: 50 8 pm: 70 - active insulin  time: 4h - targets: 12 am-9 am: 110 9 am-12 am: 130  Please do the following before every meal: - Enter carbs (C) - Enter sugars (S) - Start insulin  bolus (I)  Look up the iLet pump.  Please stop at the lab.  Please return in 4-6 months.  - we checked his HbA1c: 7%  - advised to check sugars at different times of the day - 4x a day, rotating check times - advised for yearly eye exams >> he is UTD - return to clinic in 4-6 months  2. Hashimoto's hypothyroidism - latest thyroid  labs reviewed with pt. >> normal 06/2023: 2.839 - he continues on LT4 75 mcg daily - pt feels good on this dose. - we discussed about taking the thyroid  hormone every day, with water, >30 minutes before breakfast, separated by >4 hours from acid reflux medications, calcium, iron, multivitamins. Pt. is taking it correctly now.  Before last visit, he was started on omeprazole twice a  day and he was taking the first dose along with levothyroxine  so we could not check labs at that time.  I advised him to move omeprazole at least 4 hours after levothyroxine .    Emilie Harden, MD PhD [facility] Endocrinology

## 2023-11-10 ENCOUNTER — Inpatient Hospital Stay (HOSPITAL_COMMUNITY)
Admission: EM | Admit: 2023-11-10 | Discharge: 2023-11-14 | DRG: 281 | Disposition: A | Discharge status: Home / Self Care | Attending: Internal Medicine | Admitting: Internal Medicine

## 2023-11-10 DIAGNOSIS — H5462 Unqualified visual loss, left eye, normal vision right eye: Secondary | ICD-10-CM | POA: Diagnosis present

## 2023-11-10 DIAGNOSIS — E875 Hyperkalemia: Secondary | ICD-10-CM | POA: Diagnosis present

## 2023-11-10 DIAGNOSIS — E109 Type 1 diabetes mellitus without complications: Secondary | ICD-10-CM | POA: Insufficient documentation

## 2023-11-10 DIAGNOSIS — G8929 Other chronic pain: Secondary | ICD-10-CM | POA: Diagnosis present

## 2023-11-10 DIAGNOSIS — I214 Non-ST elevation (NSTEMI) myocardial infarction: Principal | ICD-10-CM | POA: Diagnosis present

## 2023-11-10 DIAGNOSIS — Z7960 Long term (current) use of unspecified immunomodulators and immunosuppressants: Secondary | ICD-10-CM

## 2023-11-10 DIAGNOSIS — Z9483 Pancreas transplant status: Secondary | ICD-10-CM

## 2023-11-10 DIAGNOSIS — E10319 Type 1 diabetes mellitus with unspecified diabetic retinopathy without macular edema: Secondary | ICD-10-CM | POA: Diagnosis present

## 2023-11-10 DIAGNOSIS — Z6831 Body mass index (BMI) 31.0-31.9, adult: Secondary | ICD-10-CM

## 2023-11-10 DIAGNOSIS — R030 Elevated blood-pressure reading, without diagnosis of hypertension: Secondary | ICD-10-CM | POA: Insufficient documentation

## 2023-11-10 DIAGNOSIS — T8619 Other complication of kidney transplant: Secondary | ICD-10-CM | POA: Diagnosis present

## 2023-11-10 DIAGNOSIS — F411 Generalized anxiety disorder: Secondary | ICD-10-CM | POA: Diagnosis present

## 2023-11-10 DIAGNOSIS — K219 Gastro-esophageal reflux disease without esophagitis: Secondary | ICD-10-CM | POA: Diagnosis present

## 2023-11-10 DIAGNOSIS — J811 Chronic pulmonary edema: Secondary | ICD-10-CM | POA: Diagnosis present

## 2023-11-10 DIAGNOSIS — Z940 Kidney transplant status: Secondary | ICD-10-CM

## 2023-11-10 DIAGNOSIS — N179 Acute kidney failure, unspecified: Secondary | ICD-10-CM | POA: Diagnosis present

## 2023-11-10 DIAGNOSIS — Z7982 Long term (current) use of aspirin: Secondary | ICD-10-CM

## 2023-11-10 DIAGNOSIS — E1043 Type 1 diabetes mellitus with diabetic autonomic (poly)neuropathy: Secondary | ICD-10-CM | POA: Diagnosis present

## 2023-11-10 DIAGNOSIS — R079 Chest pain, unspecified: Secondary | ICD-10-CM | POA: Insufficient documentation

## 2023-11-10 DIAGNOSIS — D84821 Immunodeficiency due to drugs: Secondary | ICD-10-CM | POA: Diagnosis present

## 2023-11-10 DIAGNOSIS — Z794 Long term (current) use of insulin: Secondary | ICD-10-CM

## 2023-11-10 DIAGNOSIS — I2510 Atherosclerotic heart disease of native coronary artery without angina pectoris: Secondary | ICD-10-CM | POA: Diagnosis present

## 2023-11-10 DIAGNOSIS — Z833 Family history of diabetes mellitus: Secondary | ICD-10-CM

## 2023-11-10 DIAGNOSIS — Y830 Surgical operation with transplant of whole organ as the cause of abnormal reaction of the patient, or of later complication, without mention of misadventure at the time of the procedure: Secondary | ICD-10-CM | POA: Diagnosis present

## 2023-11-10 DIAGNOSIS — Z91018 Allergy to other foods: Secondary | ICD-10-CM

## 2023-11-10 DIAGNOSIS — R0789 Other chest pain: Principal | ICD-10-CM

## 2023-11-10 DIAGNOSIS — E063 Autoimmune thyroiditis: Secondary | ICD-10-CM | POA: Diagnosis present

## 2023-11-10 DIAGNOSIS — Z823 Family history of stroke: Secondary | ICD-10-CM

## 2023-11-10 DIAGNOSIS — Z79890 Hormone replacement therapy (postmenopausal): Secondary | ICD-10-CM

## 2023-11-10 DIAGNOSIS — E872 Acidosis, unspecified: Secondary | ICD-10-CM | POA: Diagnosis present

## 2023-11-10 DIAGNOSIS — E7800 Pure hypercholesterolemia, unspecified: Secondary | ICD-10-CM | POA: Diagnosis present

## 2023-11-10 DIAGNOSIS — Z79899 Other long term (current) drug therapy: Secondary | ICD-10-CM

## 2023-11-10 DIAGNOSIS — E1022 Type 1 diabetes mellitus with diabetic chronic kidney disease: Secondary | ICD-10-CM | POA: Diagnosis present

## 2023-11-10 DIAGNOSIS — F32A Depression, unspecified: Secondary | ICD-10-CM | POA: Diagnosis present

## 2023-11-10 DIAGNOSIS — E230 Hypopituitarism: Secondary | ICD-10-CM | POA: Diagnosis present

## 2023-11-10 DIAGNOSIS — Z9641 Presence of insulin pump (external) (internal): Secondary | ICD-10-CM | POA: Diagnosis present

## 2023-11-10 DIAGNOSIS — K3184 Gastroparesis: Secondary | ICD-10-CM | POA: Diagnosis present

## 2023-11-10 DIAGNOSIS — E669 Obesity, unspecified: Secondary | ICD-10-CM | POA: Diagnosis present

## 2023-11-10 DIAGNOSIS — N1832 Chronic kidney disease, stage 3b: Secondary | ICD-10-CM | POA: Diagnosis present

## 2023-11-10 DIAGNOSIS — Z807 Family history of other malignant neoplasms of lymphoid, hematopoietic and related tissues: Secondary | ICD-10-CM

## 2023-11-10 DIAGNOSIS — R7989 Other specified abnormal findings of blood chemistry: Secondary | ICD-10-CM

## 2023-11-11 ENCOUNTER — Inpatient Hospital Stay (HOSPITAL_COMMUNITY)

## 2023-11-11 ENCOUNTER — Encounter (HOSPITAL_COMMUNITY): Payer: Self-pay | Admitting: Internal Medicine

## 2023-11-11 ENCOUNTER — Emergency Department (HOSPITAL_COMMUNITY)

## 2023-11-11 ENCOUNTER — Other Ambulatory Visit: Payer: Self-pay

## 2023-11-11 DIAGNOSIS — F411 Generalized anxiety disorder: Secondary | ICD-10-CM | POA: Insufficient documentation

## 2023-11-11 DIAGNOSIS — Z79890 Hormone replacement therapy (postmenopausal): Secondary | ICD-10-CM | POA: Diagnosis not present

## 2023-11-11 DIAGNOSIS — N179 Acute kidney failure, unspecified: Secondary | ICD-10-CM | POA: Insufficient documentation

## 2023-11-11 DIAGNOSIS — I25110 Atherosclerotic heart disease of native coronary artery with unstable angina pectoris: Secondary | ICD-10-CM | POA: Diagnosis not present

## 2023-11-11 DIAGNOSIS — R0789 Other chest pain: Secondary | ICD-10-CM | POA: Diagnosis present

## 2023-11-11 DIAGNOSIS — I10 Essential (primary) hypertension: Secondary | ICD-10-CM

## 2023-11-11 DIAGNOSIS — K219 Gastro-esophageal reflux disease without esophagitis: Secondary | ICD-10-CM | POA: Diagnosis present

## 2023-11-11 DIAGNOSIS — E1043 Type 1 diabetes mellitus with diabetic autonomic (poly)neuropathy: Secondary | ICD-10-CM | POA: Diagnosis present

## 2023-11-11 DIAGNOSIS — E230 Hypopituitarism: Secondary | ICD-10-CM | POA: Diagnosis present

## 2023-11-11 DIAGNOSIS — R079 Chest pain, unspecified: Secondary | ICD-10-CM | POA: Insufficient documentation

## 2023-11-11 DIAGNOSIS — E875 Hyperkalemia: Secondary | ICD-10-CM

## 2023-11-11 DIAGNOSIS — I214 Non-ST elevation (NSTEMI) myocardial infarction: Secondary | ICD-10-CM | POA: Diagnosis present

## 2023-11-11 DIAGNOSIS — E7800 Pure hypercholesterolemia, unspecified: Secondary | ICD-10-CM | POA: Diagnosis present

## 2023-11-11 DIAGNOSIS — F32A Depression, unspecified: Secondary | ICD-10-CM | POA: Diagnosis present

## 2023-11-11 DIAGNOSIS — J811 Chronic pulmonary edema: Secondary | ICD-10-CM | POA: Diagnosis present

## 2023-11-11 DIAGNOSIS — E10319 Type 1 diabetes mellitus with unspecified diabetic retinopathy without macular edema: Secondary | ICD-10-CM | POA: Diagnosis present

## 2023-11-11 DIAGNOSIS — Z794 Long term (current) use of insulin: Secondary | ICD-10-CM | POA: Diagnosis not present

## 2023-11-11 DIAGNOSIS — E063 Autoimmune thyroiditis: Secondary | ICD-10-CM | POA: Diagnosis present

## 2023-11-11 DIAGNOSIS — N1832 Chronic kidney disease, stage 3b: Secondary | ICD-10-CM

## 2023-11-11 DIAGNOSIS — I2510 Atherosclerotic heart disease of native coronary artery without angina pectoris: Secondary | ICD-10-CM | POA: Diagnosis present

## 2023-11-11 DIAGNOSIS — G8929 Other chronic pain: Secondary | ICD-10-CM | POA: Diagnosis present

## 2023-11-11 DIAGNOSIS — Z79899 Other long term (current) drug therapy: Secondary | ICD-10-CM | POA: Diagnosis not present

## 2023-11-11 DIAGNOSIS — Z940 Kidney transplant status: Secondary | ICD-10-CM | POA: Diagnosis not present

## 2023-11-11 DIAGNOSIS — T8619 Other complication of kidney transplant: Secondary | ICD-10-CM | POA: Diagnosis present

## 2023-11-11 DIAGNOSIS — R030 Elevated blood-pressure reading, without diagnosis of hypertension: Secondary | ICD-10-CM | POA: Diagnosis not present

## 2023-11-11 DIAGNOSIS — Y830 Surgical operation with transplant of whole organ as the cause of abnormal reaction of the patient, or of later complication, without mention of misadventure at the time of the procedure: Secondary | ICD-10-CM | POA: Diagnosis present

## 2023-11-11 DIAGNOSIS — E1022 Type 1 diabetes mellitus with diabetic chronic kidney disease: Secondary | ICD-10-CM | POA: Diagnosis present

## 2023-11-11 DIAGNOSIS — D84821 Immunodeficiency due to drugs: Secondary | ICD-10-CM | POA: Diagnosis present

## 2023-11-11 DIAGNOSIS — E872 Acidosis, unspecified: Secondary | ICD-10-CM | POA: Diagnosis present

## 2023-11-11 DIAGNOSIS — E669 Obesity, unspecified: Secondary | ICD-10-CM | POA: Diagnosis present

## 2023-11-11 DIAGNOSIS — Z9483 Pancreas transplant status: Secondary | ICD-10-CM

## 2023-11-11 DIAGNOSIS — E109 Type 1 diabetes mellitus without complications: Secondary | ICD-10-CM | POA: Insufficient documentation

## 2023-11-11 LAB — BASIC METABOLIC PANEL WITH GFR
Anion gap: 8 (ref 5–15)
BUN: 50 mg/dL — ABNORMAL HIGH (ref 6–20)
CO2: 17 mmol/L — ABNORMAL LOW (ref 22–32)
Calcium: 9.3 mg/dL (ref 8.9–10.3)
Chloride: 114 mmol/L — ABNORMAL HIGH (ref 98–111)
Creatinine, Ser: 1.94 mg/dL — ABNORMAL HIGH (ref 0.61–1.24)
GFR, Estimated: 42 mL/min — ABNORMAL LOW (ref >60)
Glucose, Bld: 124 mg/dL — ABNORMAL HIGH (ref 70–99)
Potassium: 5.2 mmol/L — ABNORMAL HIGH (ref 3.5–5.1)
Sodium: 139 mmol/L (ref 135–145)

## 2023-11-11 LAB — BRAIN NATRIURETIC PEPTIDE: B Natriuretic Peptide: 113.2 pg/mL — ABNORMAL HIGH (ref 0.0–100.0)

## 2023-11-11 LAB — URINALYSIS, ROUTINE W REFLEX MICROSCOPIC
Bacteria, UA: NONE SEEN
Bilirubin Urine: NEGATIVE
Glucose, UA: >=500 mg/dL — AB
Hgb urine dipstick: NEGATIVE
Ketones, ur: NEGATIVE mg/dL
Leukocytes,Ua: NEGATIVE
Nitrite: NEGATIVE
Protein, ur: 100 mg/dL — AB
Specific Gravity, Urine: 1.014 (ref 1.005–1.030)
pH: 5 (ref 5.0–8.0)

## 2023-11-11 LAB — ECHOCARDIOGRAM COMPLETE
AR max vel: 2.73 cm2
AV Area VTI: 2.87 cm2
AV Area mean vel: 2.86 cm2
AV Mean grad: 7 mmHg
AV Peak grad: 10.6 mmHg
Ao pk vel: 1.63 m/s
Area-P 1/2: 3.58 cm2
Height: 69 in
S' Lateral: 2.6 cm
Weight: 3308.66 [oz_av]

## 2023-11-11 LAB — CBG MONITORING, ED: Glucose-Capillary: 158 mg/dL — ABNORMAL HIGH (ref 70–99)

## 2023-11-11 LAB — CBC WITH DIFFERENTIAL/PLATELET
Abs Immature Granulocytes: 0.04 K/uL (ref 0.00–0.07)
Basophils Absolute: 0.1 K/uL (ref 0.0–0.1)
Basophils Relative: 1 %
Eosinophils Absolute: 0.4 K/uL (ref 0.0–0.5)
Eosinophils Relative: 3 %
HCT: 33.3 % — ABNORMAL LOW (ref 39.0–52.0)
Hemoglobin: 10.7 g/dL — ABNORMAL LOW (ref 13.0–17.0)
Immature Granulocytes: 0 %
Lymphocytes Relative: 12 %
Lymphs Abs: 1.4 K/uL (ref 0.7–4.0)
MCH: 31.4 pg (ref 26.0–34.0)
MCHC: 32.1 g/dL (ref 30.0–36.0)
MCV: 97.7 fL (ref 80.0–100.0)
Monocytes Absolute: 1 K/uL (ref 0.1–1.0)
Monocytes Relative: 8 %
Neutro Abs: 9.1 K/uL — ABNORMAL HIGH (ref 1.7–7.7)
Neutrophils Relative %: 76 %
Platelets: 308 K/uL (ref 150–400)
RBC: 3.41 MIL/uL — ABNORMAL LOW (ref 4.22–5.81)
RDW: 13.1 % (ref 11.5–15.5)
WBC: 11.9 K/uL — ABNORMAL HIGH (ref 4.0–10.5)
nRBC: 0 % (ref 0.0–0.2)

## 2023-11-11 LAB — HEPARIN LEVEL (UNFRACTIONATED)
Heparin Unfractionated: >1.1 [IU]/mL — ABNORMAL HIGH (ref 0.30–0.70)
Heparin Unfractionated: 0.23 [IU]/mL — ABNORMAL LOW (ref 0.30–0.70)
Heparin Unfractionated: 0.23 [IU]/mL — ABNORMAL LOW (ref 0.30–0.70)

## 2023-11-11 LAB — MRSA NEXT GEN BY PCR, NASAL: MRSA by PCR Next Gen: NOT DETECTED

## 2023-11-11 LAB — GLUCOSE, CAPILLARY
Glucose-Capillary: 112 mg/dL — ABNORMAL HIGH (ref 70–99)
Glucose-Capillary: 126 mg/dL — ABNORMAL HIGH (ref 70–99)
Glucose-Capillary: 126 mg/dL — ABNORMAL HIGH (ref 70–99)
Glucose-Capillary: 138 mg/dL — ABNORMAL HIGH (ref 70–99)
Glucose-Capillary: 156 mg/dL — ABNORMAL HIGH (ref 70–99)
Glucose-Capillary: 159 mg/dL — ABNORMAL HIGH (ref 70–99)

## 2023-11-11 LAB — LIPID PANEL
Cholesterol: 165 mg/dL (ref 0–200)
HDL: 54 mg/dL (ref >40)
LDL Cholesterol: 104 mg/dL — ABNORMAL HIGH (ref 0–99)
Total CHOL/HDL Ratio: 3.1 ratio
Triglycerides: 35 mg/dL (ref <150)
VLDL: 7 mg/dL (ref 0–40)

## 2023-11-11 LAB — CREATININE, URINE, RANDOM: Creatinine, Urine: 86 mg/dL

## 2023-11-11 LAB — HEMOGLOBIN A1C
Hgb A1c MFr Bld: 6.4 % — ABNORMAL HIGH (ref 4.8–5.6)
Mean Plasma Glucose: 136.98 mg/dL

## 2023-11-11 LAB — TROPONIN I (HIGH SENSITIVITY)
Troponin I (High Sensitivity): 58 ng/L — ABNORMAL HIGH (ref <18)
Troponin I (High Sensitivity): 207 ng/L (ref <18)
Troponin I (High Sensitivity): 363 ng/L (ref <18)
Troponin I (High Sensitivity): 491 ng/L (ref <18)
Troponin I (High Sensitivity): 524 ng/L (ref <18)
Troponin I (High Sensitivity): 601 ng/L (ref <18)
Troponin I (High Sensitivity): 593 ng/L (ref <18)
Troponin I (High Sensitivity): 564 ng/L (ref <18)

## 2023-11-11 LAB — SODIUM, URINE, RANDOM: Sodium, Ur: 81 mmol/L

## 2023-11-11 LAB — HIV ANTIBODY (ROUTINE TESTING W REFLEX): HIV Screen 4th Generation wRfx: NONREACTIVE

## 2023-11-11 MED ORDER — SODIUM ZIRCONIUM CYCLOSILICATE 10 G PO PACK
10.0000 g | PACK | Freq: Once | ORAL | Status: AC
  Administered 2023-11-11: 10 g via ORAL
  Filled 2023-11-11: qty 1

## 2023-11-11 MED ORDER — INSULIN ASPART 100 UNIT/ML IV SOLN: 5.0000 [IU] | Freq: Once | INTRAVENOUS | Status: DC

## 2023-11-11 MED ORDER — SODIUM CHLORIDE 0.9% FLUSH
3.0000 mL | Freq: Two times a day (BID) | INTRAVENOUS | Status: DC
  Administered 2023-11-11 – 2023-11-13 (×3): 3 mL via INTRAVENOUS

## 2023-11-11 MED ORDER — PANTOPRAZOLE SODIUM 40 MG PO TBEC: 40.0000 mg | DELAYED_RELEASE_TABLET | Freq: Every day | ORAL | Status: DC

## 2023-11-11 MED ORDER — IPRATROPIUM-ALBUTEROL 0.5-2.5 (3) MG/3ML IN SOLN: 3.0000 mL | RESPIRATORY_TRACT | Status: DC | PRN

## 2023-11-11 MED ORDER — ONDANSETRON HCL 4 MG PO TABS
4.0000 mg | ORAL_TABLET | Freq: Four times a day (QID) | ORAL | Status: DC | PRN
  Administered 2023-11-13: 4 mg via ORAL
  Filled 2023-11-11: qty 1

## 2023-11-11 MED ORDER — ORAL CARE MOUTH RINSE: 15.0000 mL | OROMUCOSAL | Status: DC | PRN

## 2023-11-11 MED ORDER — ASPIRIN 81 MG PO TBEC
81.0000 mg | DELAYED_RELEASE_TABLET | Freq: Every day | ORAL | Status: DC
  Administered 2023-11-11 – 2023-11-14 (×4): 81 mg via ORAL
  Filled 2023-11-11 (×4): qty 1

## 2023-11-11 MED ORDER — TACROLIMUS 1 MG PO CAPS
1.0000 mg | ORAL_CAPSULE | Freq: Every day | ORAL | Status: DC
  Administered 2023-11-11 – 2023-11-13 (×3): 1 mg via ORAL
  Filled 2023-11-11 (×3): qty 1

## 2023-11-11 MED ORDER — HYDRALAZINE HCL 20 MG/ML IJ SOLN
10.0000 mg | INTRAMUSCULAR | Status: DC | PRN
  Administered 2023-11-12: 10 mg via INTRAVENOUS
  Filled 2023-11-11 (×2): qty 1

## 2023-11-11 MED ORDER — TACROLIMUS 1 MG PO CAPS
2.0000 mg | ORAL_CAPSULE | Freq: Every day | ORAL | Status: DC
  Administered 2023-11-11 – 2023-11-14 (×4): 2 mg via ORAL
  Filled 2023-11-11 (×4): qty 2

## 2023-11-11 MED ORDER — TACROLIMUS 1 MG PO CAPS: 1.0000 mg | ORAL_CAPSULE | ORAL | Status: DC

## 2023-11-11 MED ORDER — METOPROLOL TARTRATE 5 MG/5ML IV SOLN: 5.0000 mg | INTRAVENOUS | Status: DC | PRN

## 2023-11-11 MED ORDER — SODIUM CHLORIDE 0.9 % IV SOLN: 250.0000 mL | INTRAVENOUS | Status: AC | PRN

## 2023-11-11 MED ORDER — HEPARIN (PORCINE) 25000 UT/250ML-% IV SOLN
1500.0000 [IU]/h | INTRAVENOUS | Status: DC
  Administered 2023-11-11: 1500 [IU]/h via INTRAVENOUS
  Administered 2023-11-11: 1250 [IU]/h via INTRAVENOUS
  Administered 2023-11-12 – 2023-11-13 (×2): 1500 [IU]/h via INTRAVENOUS
  Filled 2023-11-11 (×4): qty 250

## 2023-11-11 MED ORDER — HYDRALAZINE HCL 20 MG/ML IJ SOLN
5.0000 mg | Freq: Once | INTRAMUSCULAR | Status: AC
  Administered 2023-11-11: 5 mg via INTRAVENOUS
  Filled 2023-11-11: qty 1

## 2023-11-11 MED ORDER — LACTATED RINGERS IV SOLN: INTRAVENOUS | Status: DC

## 2023-11-11 MED ORDER — HEPARIN BOLUS VIA INFUSION
4000.0000 [IU] | Freq: Once | INTRAVENOUS | Status: AC
  Administered 2023-11-11: 4000 [IU] via INTRAVENOUS
  Filled 2023-11-11: qty 4000

## 2023-11-11 MED ORDER — SENNOSIDES-DOCUSATE SODIUM 8.6-50 MG PO TABS: 1.0000 | ORAL_TABLET | Freq: Every evening | ORAL | Status: DC | PRN

## 2023-11-11 MED ORDER — LEVOTHYROXINE SODIUM 75 MCG PO TABS
75.0000 ug | ORAL_TABLET | Freq: Every day | ORAL | Status: DC
  Administered 2023-11-11 – 2023-11-14 (×4): 75 ug via ORAL
  Filled 2023-11-11 (×4): qty 1

## 2023-11-11 MED ORDER — NITROGLYCERIN 0.4 MG SL SUBL: 0.4000 mg | SUBLINGUAL_TABLET | SUBLINGUAL | Status: DC | PRN

## 2023-11-11 MED ORDER — INSULIN ASPART 100 UNIT/ML IJ SOLN: 0.0000 [IU] | Freq: Three times a day (TID) | INTRAMUSCULAR | Status: DC

## 2023-11-11 MED ORDER — SODIUM CHLORIDE 0.9% FLUSH: 3.0000 mL | INTRAVENOUS | Status: DC | PRN

## 2023-11-11 MED ORDER — CHLORHEXIDINE GLUCONATE CLOTH 2 % EX PADS
6.0000 | MEDICATED_PAD | Freq: Every day | CUTANEOUS | Status: DC
  Administered 2023-11-11 – 2023-11-14 (×3): 6 via TOPICAL

## 2023-11-11 MED ORDER — TRAZODONE HCL 50 MG PO TABS
50.0000 mg | ORAL_TABLET | Freq: Every evening | ORAL | Status: DC | PRN
  Administered 2023-11-12: 50 mg via ORAL
  Filled 2023-11-11: qty 1

## 2023-11-11 MED ORDER — INSULIN PUMP
SUBCUTANEOUS | Status: DC
  Administered 2023-11-12: 2 via SUBCUTANEOUS
  Filled 2023-11-11: qty 1

## 2023-11-11 MED ORDER — FUROSEMIDE 10 MG/ML IJ SOLN
20.0000 mg | Freq: Once | INTRAMUSCULAR | Status: AC
  Administered 2023-11-11: 20 mg via INTRAVENOUS
  Filled 2023-11-11: qty 2

## 2023-11-11 MED ORDER — BUSPIRONE HCL 10 MG PO TABS: 10.0000 mg | ORAL_TABLET | Freq: Two times a day (BID) | ORAL | Status: DC

## 2023-11-11 MED ORDER — INSULIN ASPART 100 UNIT/ML IJ SOLN: 0.0000 [IU] | Freq: Four times a day (QID) | INTRAMUSCULAR | Status: DC | PRN

## 2023-11-11 MED ORDER — FUROSEMIDE 20 MG PO TABS: 20.0000 mg | ORAL_TABLET | Freq: Every day | ORAL | Status: DC

## 2023-11-11 MED ORDER — DEXTROSE 50 % IV SOLN: 1.0000 | Freq: Once | INTRAVENOUS | Status: DC

## 2023-11-11 MED ORDER — PANTOPRAZOLE SODIUM 40 MG PO TBEC
40.0000 mg | DELAYED_RELEASE_TABLET | Freq: Two times a day (BID) | ORAL | Status: DC
  Administered 2023-11-11 – 2023-11-13 (×5): 40 mg via ORAL
  Filled 2023-11-11 (×5): qty 1

## 2023-11-11 MED ORDER — BUSPIRONE HCL 10 MG PO TABS
10.0000 mg | ORAL_TABLET | Freq: Two times a day (BID) | ORAL | Status: DC
  Administered 2023-11-11 – 2023-11-14 (×7): 10 mg via ORAL
  Filled 2023-11-11 (×7): qty 1

## 2023-11-11 MED ORDER — ATORVASTATIN CALCIUM 40 MG PO TABS
40.0000 mg | ORAL_TABLET | Freq: Every day | ORAL | Status: DC
  Administered 2023-11-11 – 2023-11-14 (×4): 40 mg via ORAL
  Filled 2023-11-11 (×4): qty 1

## 2023-11-11 MED ORDER — GUAIFENESIN 100 MG/5ML PO LIQD: 5.0000 mL | ORAL | Status: DC | PRN

## 2023-11-11 MED ORDER — ACETAMINOPHEN 325 MG PO TABS: 650.0000 mg | ORAL_TABLET | Freq: Four times a day (QID) | ORAL | Status: DC | PRN

## 2023-11-11 MED ORDER — ACETAMINOPHEN 650 MG RE SUPP: 650.0000 mg | Freq: Four times a day (QID) | RECTAL | Status: DC | PRN

## 2023-11-11 MED ORDER — INSULIN ASPART 100 UNIT/ML IJ SOLN: 0.0000 [IU] | Freq: Every day | INTRAMUSCULAR | Status: DC

## 2023-11-11 MED ORDER — SODIUM CHLORIDE 0.9 % IV SOLN: INTRAVENOUS | Status: DC

## 2023-11-11 MED ORDER — ONDANSETRON HCL 4 MG/2ML IJ SOLN: 4.0000 mg | Freq: Four times a day (QID) | INTRAMUSCULAR | Status: DC | PRN

## 2023-11-11 MED ORDER — SODIUM CHLORIDE 0.9% FLUSH
3.0000 mL | Freq: Two times a day (BID) | INTRAVENOUS | Status: DC
  Administered 2023-11-11 – 2023-11-13 (×5): 3 mL via INTRAVENOUS

## 2023-11-11 NOTE — Consult Note (Addendum)
 Cardiology Consultation   Patient ID: Daniel Hill MRN: [id number]; DOB: [birth year]  Admit date: 11/10/2023 Date of Consult: 11/11/2023  PCP:  Thurmond Cathlyn LABOR., MD   [facility] Providers Cardiologist: New to Dr. CR[facility] heart care, can establish with DOD Dr Jeffrie going forward    Patient Profile: Daniel Hill is a 48 y.o. 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 Caleen.  History of Present Illness: Mr. Misuraca with above PMH presented to the ER last night complaining of left-sided chest pain.  He was sleeping and woke up with new onset of left-sided chest pain with pressure around 9 PM last night. He was not doing anything exertional prior. He has never had similar symptoms in the past. He recalls feeling SOB over the past few weeks, sometimes due to exertional activity, sometimes at rest. He has intermittent swelling of his legs, ever since his kidney function worsened. He takes as needed lasix  as baseline for leg edema. He had renal transplant 2003, renal function had been slowly declining over the past year due to tacrolimus  use. He follows [facility].  He denied orthopnea, states he only get SOB at night if he does not uses his CPAP. He is trying to use CPAP nightly. He states he was not particularly SOB last night when he had chest pain. He does recall feeling some dizziness. His wife states he had complained feeling cold initially, after using bathroom, he was very hot and removed all covers and was shaking. They did not notice any fever, cough, nausea, emesis, diarrhea, dysuria. He states he feels somewhat congested in his sinus over the past few  days. He felt ASA and nitroglycerin  did not make much difference, and his pain has resolved since 130am.  He denied any known cardiac disease. He denied any bleeding. He denied any contrast allergy. He states his brother required cardiac stent placement and his father needed CABG at age of 13s. He is worried about his renal function, states before we do anything, his nephrologist and [facility] transplant team need to be called.   Per ER workup at admission 11/11/2023, BMP revealed potassium 5.2, bicarb 17, BUN 50, creatinine 1.94, and GFR 42.  BNP 113.  High sensitive troponin 58 >207 >363>491.  CBC was leukocytosis 11 900 hemoglobin 10.7.  LDL 104.  Hemoglobin A1c 6.4%.  Urinalysis positive for glucose and protein. CXR showed Mild cardiomegaly with suspected very mild pulmonary edema. EKG from 11/11/2023 at 12:10 AM reveals sinus rhythm 97 bpm, abnormal R wave progression, artifact.  EKG repeated on 11/11/2023 at 4:18 AM revealed sinus rhythm 97 bpm, no acute change.  Patient was admitted to hospitalist service for non-STEMI and AKI and hypertension with concern of CHF.  Cardiology fellow was contacted overnight, recommended starting heparin  drip for non-STEMI.  Official cardiology consult is requested this morning.  Per chart review, he underwent kidney-pancreatic transplant in 2003, but his pancreas transplant failed in 2013. He was considered too high risk for a new pancreatic transplant. He is on chronic immunosuppression medications and follow [facility] for post-transplant care. He has been type 1 diabetic since 48 years old, suffers complications such as retinopathy and  gastroparesis. 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 He uses Omnipod DASH insulin  pump for his diabetes since 2022. He had a nuclear stress test at [facility] 09/28/21, no results /report can be found on care everywhere. TEE from 09/28/21 at [facility] showed LVEF 65-70%, trace MR. CT chest without from [facility] 11/25/22 showed heavy  coronary artery calcifications.     Past Medical History:  Diagnosis Date   Anxiety    Depression    Diabetes mellitus    GERD (gastroesophageal reflux disease)    Hypothyroidism    Kidney replaced by transplant    Pancreas transplanted (HCC)    Thyroid  disease     Past Surgical History:  Procedure Laterality Date   EYE SURGERY       Home Medications:  Prior to Admission medications   Medication Sig Start Date End Date Taking? Authorizing Provider  busPIRone  (BUSPAR ) 10 MG tablet Take 10 mg by mouth 2 (two) times daily. 09/25/23  Yes [provider]  cholecalciferol (VITAMIN D3) 25 MCG (1000 UNIT) tablet Take 1,000 Units by mouth daily.   Yes [provider]  esomeprazole  (NEXIUM ) 40 MG capsule Take 40 mg by mouth 2 (two) times daily.   Yes [provider]  FARXIGA  10 MG TABS tablet Take 10 mg by mouth daily.   Yes [provider]  fluticasone (FLONASE) 50 MCG/ACT nasal spray Place 1 spray into both nostrils daily. 06/12/23  Yes [provider]  Insulin  Disposable Pump (OMNIPOD 5 DEXG7G6 PODS GEN 5) MISC Use 1 pod every 3 days 05/24/23  Yes Trixie File, MD  levothyroxine  (SYNTHROID ) 75 MCG tablet TAKE 1 TABLET(75 MCG) BY MOUTH DAILY 02/11/22  Yes Trixie File, MD  METHYLENE BLUE, BULK-SOLID, POWD Take 1 Scoop by mouth daily. Mix with a beverage and drink   Yes [provider]  Multiple Vitamins-Minerals (MULTIVITAMINS THER. W/MINERALS) TABS Take 1 tablet by mouth daily.   Yes [provider]  promethazine  (PHENERGAN ) 25 MG tablet Take 25 mg by mouth every 6 (six) hours as needed.   Yes [provider]  tacrolimus  (PROGRAF ) 1 MG capsule Take 1-2 mg by mouth See admin instructions. Take 2 capsules by mouth in the morning and 1 capsule at night   Yes [provider]  traMADol (ULTRAM) 50 MG tablet Take 50-100 mg by mouth every 6 (six) hours as needed for pain. 05/16/17  Yes [provider]   VELTASSA 8.4 g packet Take 8.4 g by mouth every other day.   Yes [provider]  Continuous Glucose Sensor (DEXCOM G6 SENSOR) MISC Use as instructed to check blood sugar. Change every 10 days 05/24/23   Trixie File, MD  Continuous Glucose Transmitter (DEXCOM G6 TRANSMITTER) MISC Use as instructed. Change every 90 days 05/24/23   Trixie File, MD  furosemide  (LASIX ) 20 MG tablet Take 20 mg by mouth daily as needed for edema (for legs).    [provider]  Glucagon  (BAQSIMI  TWO PACK) 3 MG/DOSE POWD Place 3 mg into the nose once as needed for up to 1 dose. 03/16/20   Trixie File, MD  Insulin  Aspart, w/Niacinamide, (FIASP ) 100 UNIT/ML SOLN INJECT UP TO 50 UNITS VIA INSULIN  PUMP DAILY 05/24/23   Trixie File, MD  SYRINGE/NEEDLE, DISP, 1 ML 23G X 1 1 ML MISC Use every week 07/05/19   Trixie File, MD  TUBERCULIN SYR 1CC/25GX5/8 (B-D TB SYRINGE 1CC/25GX5/8) 25G X 5/8 1 ML MISC USE 1 EVERY 2 WEEKS 03/31/20   Trixie File, MD    Scheduled Meds:  aspirin  EC  81 mg Oral Daily   atorvastatin   40 mg Oral Daily   busPIRone   10 mg Oral BID   insulin  aspart  0-5 Units Subcutaneous QHS   insulin  aspart  0-9 Units Subcutaneous TID WC   insulin  aspart  5 Units Intravenous Once   insulin  pump   Subcutaneous Q4H   levothyroxine   75 mcg Oral Q0600   pantoprazole   40 mg Oral BID   sodium chloride  flush  3 mL Intravenous Q12H   sodium chloride  flush  3 mL Intravenous Q12H   tacrolimus   1 mg Oral QHS   tacrolimus   2 mg Oral Daily   Continuous Infusions:  sodium chloride      heparin  1,250 Units/hr (11/11/23 0700)   PRN Meds: sodium chloride , acetaminophen  **OR** acetaminophen , guaiFENesin , hydrALAZINE , ipratropium-albuterol , metoprolol  tartrate, nitroGLYCERIN , ondansetron  **OR** ondansetron  (ZOFRAN ) IV, senna-docusate, sodium chloride  flush, traZODone   Allergies:    Allergies  Allergen Reactions   Tomato Nausea And Vomiting    Immediate vomiting     Other Nausea And Vomiting    RAW Canteloupe, banana, tomatoes, all raw vegetables  Unless the vegetables have dressing on them.  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   Codeine Nausea And Vomiting   Metoclopramide Hcl Nausea And Vomiting    Social History:   Social History   Socioeconomic History   Marital status: Married    Spouse name: Not on file   Number of children: Not on file   Years of education: Not on file   Highest education level: Not on file  Occupational History   Not on file  Tobacco Use   Smoking status: Never   Smokeless tobacco: Never  Substance and Sexual Activity   Alcohol use: No   Drug use: No   Sexual activity: Not on file  Other Topics Concern   Not on file  Social History Narrative   Not on file   Social Drivers of Health   Financial Resource Strain: Not on file  Food Insecurity: Not on file  Transportation Needs: Not on file  Physical Activity: Not on file  Stress: Not on file  Social Connections: Not on file  Intimate Partner Violence: Not on file    Family History:    Family History  Problem Relation Age of Onset   Stroke Mother    Lymphoma Father    Diabetes type II Father      ROS:  Constitutional: chills, hot flush  Eyes: Denied vision change or loss Ears/Nose/Mouth/Throat: Denied ear ache, sore throat, coughing, sinus pain Cardiovascular: see HPI  Respiratory: see HPI  Gastrointestinal: Denied nausea, vomiting, abdominal pain, diarrhea Genital/Urinary: Denied dysuria, hematuria, urinary frequency/urgency Musculoskeletal: Denied muscle ache, joint pain, weakness Skin: Denied rash, wound Neuro: Denied headache, syncope Psych: history of depression/anxiety  Endocrine: history of diabetes      Physical Exam/Data: Vitals:   11/11/23 0615 11/11/23 0630 11/11/23 0652 11/11/23 0735  BP: (!) 168/84 (!) 164/84  (!) 144/81  Pulse: 93 82    Resp: (!) 23 18  16   Temp:   98.1 F (36.7 C)   TempSrc:   Oral   SpO2:  98% 98%  94%  Weight:   93.8 kg   Height:   5' 9 (1.753 m)     Intake/Output Summary (Last 24 hours) at 11/11/2023 0809 Last data filed at 11/11/2023 0700 Gross per 24 hour  Intake 49.13 ml  Output 300 ml  Net -250.87 ml  11/11/2023    6:52 AM 05/24/2023   11:01 AM 12/10/2021    8:25 AM  Last 3 Weights  Weight (lbs) 206 lb 12.7 oz 198 lb 6.4 oz 196 lb 12.8 oz  Weight (kg) 93.8 kg 89.994 kg 89.268 kg     Body mass index is 30.54 kg/m.   Vitals:  Vitals:   11/11/23 0652 11/11/23 0735  BP:  (!) 144/81  Pulse:    Resp:  16  Temp: 98.1 F (36.7 C)   SpO2:  94%   General Appearance: In no apparent distress, laying in bed HEENT: Normocephalic, atraumatic. Left eye blind  Neck: Supple, trachea midline, no JVDs Cardiovascular: Regular rate and rhythm, normal S1-S2,  no murmur Respiratory: Resting breathing unlabored, lungs sounds clear to auscultation bilaterally, no use of accessory muscles. On room air.  Gastrointestinal: Bowel sounds positive, abdomen soft, non-tender Extremities: Able to move all extremities in bed without difficulty, no edema of BLE  Musculoskeletal: Normal muscle bulk and tone Skin: Intact, warm, dry.   Neurologic: Alert, oriented to person, place and time. no cognitive deficit Psychiatric: Anxious       EKG:  The EKG was personally reviewed and demonstrates:    EKG from 11/11/2023 at 12:10 AM reveals sinus rhythm 97 bpm, abnormal R wave progression, artifact.    EKG repeated on 11/11/2023 at 4:18 AM revealed sinus rhythm 97 bpm, no acute change.   Telemetry:  Telemetry was personally reviewed and demonstrates:    Sinus rhythm/tachycardia 110s   Relevant CV Studies:  Echo from 09/28/21 at [facility]:   SUMMARY  NPS.  The left ventricular size is normal.  There is normal left ventricular wall thickness.  Left ventricular systolic function is normal.  LV ejection fraction = 65-70%.  There is trace mitral regurgitation.    Echo  07/06/16:  - Left ventricle: The cavity size was normal. Wall thickness was    normal. Systolic function was normal. The estimated ejection    fraction was in the range of 60% to 65%. Wall motion was normal;    there were no regional wall motion abnormalities.   Laboratory Data: High Sensitivity Troponin:   Recent Labs  Lab 11/11/23 0007 11/11/23 0233 11/11/23 0524  TROPONINIHS 58* 207* 363*     Chemistry Recent Labs  Lab 11/11/23 0007  NA 139  K 5.2*  CL 114*  CO2 17*  GLUCOSE 124*  BUN 50*  CREATININE 1.94*  CALCIUM  9.3  GFRNONAA 42*  ANIONGAP 8    No results for input(s): PROT, ALBUMIN, AST, ALT, ALKPHOS, BILITOT in the last 168 hours. Lipids  Recent Labs  Lab 11/11/23 0524  CHOL 165  TRIG 35  HDL 54  LDLCALC 104*  CHOLHDL 3.1    Hematology Recent Labs  Lab 11/11/23 0007  WBC 11.9*  RBC 3.41*  HGB 10.7*  HCT 33.3*  MCV 97.7  MCH 31.4  MCHC 32.1  RDW 13.1  PLT 308   Thyroid  No results for input(s): TSH, FREET4 in the last 168 hours.  BNP Recent Labs  Lab 11/11/23 0007  BNP 113.2*    DDimer No results for input(s): DDIMER in the last 168 hours.  Radiology/Studies:  DG Chest Portable 1 View Result Date: 11/11/2023 EXAM: 1 VIEW XRAY OF THE CHEST 11/11/2023 12:12:59 AM COMPARISON: CT chest dated 11/25/2022. CLINICAL HISTORY: CP. FINDINGS: LUNGS AND PLEURA: Mild pulmonary edema. No pleural effusion. No pneumothorax. HEART AND MEDIASTINUM: Mild cardiomegaly. BONES AND SOFT TISSUES: No acute osseous abnormality. IMPRESSION:  1. Mild cardiomegaly with suspected very mild pulmonary edema. Electronically signed by: Pinkie Pebbles MD 11/11/2023 12:20 AM EDT RP Workstation: [id number]     Assessment and Plan:  NSTEMI - Presented to ER with left-sided chest pain and pressure starting at 9 AM last night, associated with hot flush, dizziness - High sensitive troponin 58 >207>363>491 - EKG without acute ST-T abnormality suggesting  ischemia - Repeat echocardiogram has been requested by primary team and pending, will follow - Coronary calcification were noted on CT chest from [facility] 11/25/2022, he had a negative stress test and unremarkable echocardiogram 09/28/2021 at [facility] -LDL 104 and hemoglobin A1c 6.4% -Recommend cardiac cath for further ischemic evaluation, he has CKD IIIb and underwent renal transplant 2003 with gradual decline of renal function over the year, explained the risk of contrast-induced nephropathy, patient is anxious about contrast use, wishes his nephrology and [facility] transplant team to be notified, defer further discussion to MD  - Continue IV heparin  gtt, start aspirin  81 mg daily and Lipitor 40 mg daily and PRN nitrolgycerin, further recommendation pending ischemic evaluation   Dyspnea and leg swelling  - From what he describes, he had random SOB episodes at rest or with exertion over the past few weeks, no orthopnea, SOB at night only when he not uses his CPAP, and chronic intermittent leg swelling since his renal function decline over the past year on PRN lasix   - BNP 113, CXR showed Mild cardiomegaly with suspected very mild pulmonary edema  - Most recent echo from [facility] 09/2021 revealed preserved LVEF - Repeat echocardiogram is pending, will follow  - clinically he does not appear in CHF decompensation, he was given IV Lasix  20 mg this morning, had so far 300 mL urine output, recommend no further diuresis for now   CKD IIIb Hx of ESRD s/p renal transplant 2003  - Cr baseline seems around 1.8-1.9  - Will need IV fluid hydration peri cath if he agrees and watch closely for CIN, trend BMP   Hypertension  - BP was up to 189/94 this morning, maybe anxiety related, trend for now  - Not on any PTA antihypertensives, if sustained elevated, plan to start coreg   Type 1 DM with retinopathy and gastroparesis  History of renal transplant History of failed pancreas  transplant Hypothyroidism Anxiety with depression Chronic pain GERD - per IM       Risk Assessment/Risk Scores:       [place]  [employer] Association (NYHA) Functional Class NYHA Class I       For questions or updates, please contact [facility] Please consult [email or web address] for contact info under    Signed, Orlie Cundari, NP  11/11/2023 8:09 AM

## 2023-11-11 NOTE — ED Provider Notes (Addendum)
 [place] EMERGENCY DEPARTMENT AT [facility] Provider Note   CSN: [id number] Arrival date & time: 11/10/23  2359     Patient presents with: Chest Pain   Daniel Hill is a 48 y.o. male.   HPI     This is a 48 year old male who presents with chest pain.  History of kidney and pancreas transplant.  Reports that he was lying down several hours ago when he began to have chest pain.  He describes it as a sharp pressure.  Never had pain like this before.  Nothing seems to make it better or worse.  Was given nitro en route and has had relief.  No further chest pain.  Does have a history of hypertension and diabetes.  No known history of heart disease.  Has not had any nausea or vomiting.  No shortness of breath or fevers.  Does report slightly increased lower extremity edema.  Patient did have stress testing at [facility] in June 2023.  Having difficulty reviewing the report but he states that this was negative.  Echocardiogram was reassuring as well with normal EF.  Prior to Admission medications   Medication Sig Start Date End Date Taking? Authorizing Provider  busPIRone  (BUSPAR ) 10 MG tablet Take 10 mg by mouth 2 (two) times daily. 09/25/23  Yes [provider]  cholecalciferol (VITAMIN D3) 25 MCG (1000 UNIT) tablet Take 1,000 Units by mouth daily.   Yes [provider]  esomeprazole  (NEXIUM ) 40 MG capsule Take 40 mg by mouth 2 (two) times daily.   Yes [provider]  FARXIGA  10 MG TABS tablet Take 10 mg by mouth daily.   Yes [provider]  fluticasone (FLONASE) 50 MCG/ACT nasal spray Place 1 spray into both nostrils daily. 06/12/23  Yes [provider]  Insulin  Disposable Pump (OMNIPOD 5 DEXG7G6 PODS GEN 5) MISC Use 1 pod every 3 days 05/24/23  Yes Trixie File, MD  levothyroxine  (SYNTHROID ) 75 MCG tablet TAKE 1 TABLET(75 MCG) BY MOUTH DAILY 02/11/22  Yes Trixie File, MD  METHYLENE BLUE, BULK-SOLID, POWD Take 1 Scoop by  mouth daily. Mix with a beverage and drink   Yes [provider]  Multiple Vitamins-Minerals (MULTIVITAMINS THER. W/MINERALS) TABS Take 1 tablet by mouth daily.   Yes [provider]  promethazine  (PHENERGAN ) 25 MG tablet Take 25 mg by mouth every 6 (six) hours as needed.   Yes [provider]  tacrolimus  (PROGRAF ) 1 MG capsule Take 1-2 mg by mouth See admin instructions. Take 2 capsules by mouth in the morning and 1 capsule at night   Yes [provider]  traMADol (ULTRAM) 50 MG tablet Take 50-100 mg by mouth every 6 (six) hours as needed for pain. 05/16/17  Yes [provider]  VELTASSA 8.4 g packet Take 8.4 g by mouth every other day.   Yes [provider]  Continuous Glucose Sensor (DEXCOM G6 SENSOR) MISC Use as instructed to check blood sugar. Change every 10 days 05/24/23   Trixie File, MD  Continuous Glucose Transmitter (DEXCOM G6 TRANSMITTER) MISC Use as instructed. Change every 90 days 05/24/23   Trixie File, MD  furosemide  (LASIX ) 20 MG tablet Take 20 mg by mouth daily as needed for edema (for legs).    [provider]  Glucagon  (BAQSIMI  TWO PACK) 3 MG/DOSE POWD Place 3 mg into the nose once as needed for up to 1 dose. 03/16/20   Trixie File, MD  Insulin  Aspart, w/Niacinamide, (FIASP ) 100 UNIT/ML SOLN  INJECT UP TO 50 UNITS VIA INSULIN  PUMP DAILY 05/24/23   Trixie File, MD  SYRINGE/NEEDLE, DISP, 1 ML 23G X 1 1 ML MISC Use every week 07/05/19   Trixie File, MD  TUBERCULIN SYR 1CC/25GX5/8 (B-D TB SYRINGE 1CC/25GX5/8) 25G X 5/8 1 ML MISC USE 1 EVERY 2 WEEKS 03/31/20   Trixie File, MD    Allergies: Tomato, Other, Codeine, and Metoclopramide hcl    Review of Systems  Constitutional:  Negative for fever.  Respiratory:  Negative for shortness of breath.   Cardiovascular:  Positive for chest pain and leg swelling.  Gastrointestinal:  Negative for abdominal pain and nausea.  All other systems  reviewed and are negative.   Updated Vital Signs BP 128/73   Pulse 69   Temp 99 F (37.2 C) (Temporal)   Resp (!) 21   SpO2 94%   Physical Exam Vitals and nursing note reviewed.  Constitutional:      Appearance: He is well-developed. He is obese.     Comments: Chronically ill-appearing but nontoxic  HENT:     Head: Normocephalic and atraumatic.  Eyes:     Comments: Cloudiness to the left eye  Cardiovascular:     Rate and Rhythm: Normal rate and regular rhythm.     Heart sounds: Normal heart sounds. No murmur heard. Pulmonary:     Effort: Pulmonary effort is normal. No respiratory distress.     Breath sounds: Normal breath sounds. No wheezing.  Abdominal:     General: Bowel sounds are normal.     Palpations: Abdomen is soft.     Tenderness: There is no abdominal tenderness. There is no rebound.     Comments: Extensive abdominal scarring well-healed  Musculoskeletal:     Cervical back: Neck supple.     Comments: Trace to 1+ bilateral lower extremity edema  Lymphadenopathy:     Cervical: No cervical adenopathy.  Skin:    General: Skin is warm and dry.  Neurological:     Mental Status: He is alert and oriented to person, place, and time.  Psychiatric:        Mood and Affect: Mood normal.     (all labs ordered are listed, but only abnormal results are displayed) Labs Reviewed  CBC WITH DIFFERENTIAL/PLATELET - Abnormal; Notable for the following components:      Result Value   WBC 11.9 (*)    RBC 3.41 (*)    Hemoglobin 10.7 (*)    HCT 33.3 (*)    Neutro Abs 9.1 (*)    All other components within normal limits  BASIC METABOLIC PANEL WITH GFR - Abnormal; Notable for the following components:   Potassium 5.2 (*)    Chloride 114 (*)    CO2 17 (*)    Glucose, Bld 124 (*)    BUN 50 (*)    Creatinine, Ser 1.94 (*)    GFR, Estimated 42 (*)    All other components within normal limits  BRAIN NATRIURETIC PEPTIDE - Abnormal; Notable for the following components:   B  Natriuretic Peptide 113.2 (*)    All other components within normal limits  TROPONIN I (HIGH SENSITIVITY) - Abnormal; Notable for the following components:   Troponin I (High Sensitivity) 58 (*)    All other components within normal limits  TROPONIN I (HIGH SENSITIVITY) - Abnormal; Notable for the following components:   Troponin I (High Sensitivity) 207 (*)    All other components within normal limits    EKG: EKG Interpretation  Date/Time:  Saturday November 11 2023 00:19:48 EDT Ventricular Rate:  95 PR Interval:  161 QRS Duration:  93 QT Interval:  349 QTC Calculation: 439 R Axis:   64  Text Interpretation: Sinus rhythm Minimal ST depression, anterolateral leads When compared with ECG of EARLIER SAME DATE No significant change was found Confirmed by Raford Lenis ([id number]) on 11/11/2023 4:18:27 AM  Radiology: DG Chest Portable 1 View Result Date: 11/11/2023 EXAM: 1 VIEW XRAY OF THE CHEST 11/11/2023 12:12:59 AM COMPARISON: CT chest dated 11/25/2022. CLINICAL HISTORY: CP. FINDINGS: LUNGS AND PLEURA: Mild pulmonary edema. No pleural effusion. No pneumothorax. HEART AND MEDIASTINUM: Mild cardiomegaly. BONES AND SOFT TISSUES: No acute osseous abnormality. IMPRESSION: 1. Mild cardiomegaly with suspected very mild pulmonary edema. Electronically signed by: Pinkie Pebbles MD 11/11/2023 12:20 AM EDT RP Workstation: [id number]     .Critical Care  Performed by: Bari Charmaine FALCON, MD Authorized by: Bari Charmaine FALCON, MD   Critical care provider statement:    Critical care time (minutes):  35   Critical care was necessary to treat or prevent imminent or life-threatening deterioration of the following conditions: ACS.   Critical care was time spent personally by me on the following activities:  Development of treatment plan with patient or surrogate, discussions with consultants, evaluation of patient's response to treatment, examination of patient, ordering and review of laboratory studies,  ordering and review of radiographic studies, ordering and performing treatments and interventions, pulse oximetry, re-evaluation of patient's condition and review of old charts    Medications Ordered in the ED - No data to display  Clinical Course as of 11/11/23 0500  Sat Nov 11, 2023  0443 Cardiology paged greater than 1 hour ago.  Have not heard back.  Requested repaged. [CH]  [phone] Spoke with Dr. Charlott.  Will start IV heparin .  Recommends admission to the hospitalist given history.  Repeat troponin and consult with daytime team. [CH]    Clinical Course User Index [CH] Taelyn Broecker, Charmaine FALCON, MD                                 Medical Decision Making Amount and/or Complexity of Data Reviewed Labs: ordered. Radiology: ordered.  Risk Decision regarding hospitalization.   This patient presents to the ED for concern of chest pain, this involves an extensive number of treatment options, and is a complaint that carries with it a high risk of complications and morbidity.  I considered the following differential and admission for this acute, potentially life threatening condition.  The differential diagnosis includes ACS, PE, pneumothorax, pneumonia  MDM:    This is a 48 year old male who presents with chest pain.  Pain has resolved since receiving nitroglycerin .  Does have a history of diabetes.  Also history of kidney and pancreas transplant.  He is overall nontoxic.  EKG shows no evidence of acute ischemia or arrhythmia.  Chest x-ray shows some mild cardiomegaly with mild pulmonary edema.  BNP 113.  Initial troponin 58.  Repeat troponin 207.  Patient is not having any ongoing active chest pain.  Will discuss with cardiology.  Will plan for admission for cardiology evaluation and serial troponins.  (Labs, imaging, consults)  Labs: I Ordered, and personally interpreted labs.  The pertinent results include: CBC, BMP, BNP, troponin x 2  Imaging Studies ordered: I ordered imaging studies  including chest x-ray I independently visualized and interpreted imaging. I agree with the radiologist interpretation  Additional  history obtained from chart review.  External records from outside source obtained and reviewed including cardiology notes  Cardiac Monitoring: The patient was maintained on a cardiac monitor.  If on the cardiac monitor, I personally viewed and interpreted the cardiac monitored which showed an underlying rhythm of: Sinus  Reevaluation: After the interventions noted above, I reevaluated the patient and found that they have :resolved  Social Determinants of Health:  lives independently  Disposition: Admit  Co morbidities that complicate the patient evaluation  Past Medical History:  Diagnosis Date   Anxiety    Depression    Diabetes mellitus    GERD (gastroesophageal reflux disease)    Hypothyroidism    Kidney replaced by transplant    Pancreas transplanted (HCC)    Thyroid  disease      Medicines No orders of the defined types were placed in this encounter.   I have reviewed the patients home medicines and have made adjustments as needed  Problem List / ED Course: Problem List Items Addressed This Visit   None Visit Diagnoses       Atypical chest pain    -  Primary     Elevated troponin                    Final diagnoses:  Atypical chest pain  Elevated troponin    ED Discharge Orders     None          Denaya Horn, Charmaine FALCON, MD 11/11/23 9540    Bari Charmaine FALCON, MD 11/11/23 0500

## 2023-11-11 NOTE — Hospital Course (Addendum)
 Brief Narrative:  48 year old with history of renal and pancreatic transplant 2003, diabetic retinopathy, diabetic gastroparesis, Hashimoto's thyroiditis, hypogonadotrophic hypogonadism, GAD, depression, and DM1 presented to the ED with complaining of left-sided chest pain.  Initially given aspirin .  Initial troponins trended up to 07, chest x-ray showed minimal ST depression in lead II otherwise normal sinus rhythm.  EDP consulted cardiology recommending heparin  drip.   Assessment & Plan:  Principal Problem:   NSTEMI (non-ST elevated myocardial infarction) (HCC) Active Problems:   Hyperkalemia   History of renal transplant   Hypothyroidism due to Hashimoto's thyroiditis   Hypogonadotropic hypogonadism (HCC)   Chest pain   AKI (acute kidney injury) (HCC)   History of pancreas transplant (HCC)   Insulin  dependent type 1 diabetes mellitus (HCC)   GAD (generalized anxiety disorder)   GERD (gastroesophageal reflux disease)   Pulmonary edema   Elevated blood pressure reading    NSTEMI Chest pain - Troponins continue to rise, remains on heparin  drip.  EKG is nonischemic.  Plans for [facility] on Monday - A1c 6.4, LDL pending - Echocardiogram -Aspirin  325 mg given, continue 81 mg daily   Elevated blood pressure Pulmonary edema, orthopnea, dyspnea, lower extremity swelling-concern for underlying CHF Echocardiogram in 2018 showed preserved EF, will repeat echocardiogram.    Hyperkalemia -Elevated potassium 5.2.  Treating with hyperkalemia cocktail NovoLog  and Lokelma .   CKD stage IIIb Baseline creatinine 1.8, admission creatinine 1.94 - IV fluids.  Will hydrate as best as possible in anticipation for contrast for left heart cath.     History of renal transplant History of pancreas transplant - Checking Tac level and continue Prograf .   History of insulin -dependent DM type I -Sliding scale and Accu-Cheks.  Reportedly on insulin  pump   Generalized anxiety disorder -Continue BuSpar     GERD -Continue Protonix    History of Hashimoto thyroiditis with hypothyroidism - Continue levothyroxine  75 mcg daily   Green-colored urine - Patient reported he takes methylene blue to support his health at cellular level and methylene blue can cause green color urine.   DVT prophylaxis:  IV heparin  gtts Code Status:  Full Code Diet: Cardiac Family Communication: Wife at bedside Disposition Plan: Plans for [facility] on Monday  Subjective: Seen and examined at bedside.  Denies any chest pain.  Understands the risk of left heart catheterization and contrast in the setting of CKD 3B and renal transplant.   Examination:  General exam: Appears calm and comfortable  Respiratory system: Clear to auscultation. Respiratory effort normal. Cardiovascular system: S1 & S2 heard, RRR. No JVD, murmurs, rubs, gallops or clicks. No pedal edema. Gastrointestinal system: Abdomen is nondistended, soft and nontender. No organomegaly or masses felt. Normal bowel sounds heard. Central nervous system: Alert and oriented. No focal neurological deficits. Extremities: Symmetric 5 x 5 power. Skin: No rashes, lesions or ulcers Psychiatry: Judgement and insight appear normal. Mood & affect appropriate.

## 2023-11-11 NOTE — Progress Notes (Signed)
 Dr Cindie had evaluated the patient, reviewed risk versus benefit of cardiac cath further with the patient, he is agreeable with LHC, will add to Monday schedule. Orders placed.   Called [facility] transplant team [phone] per patient and wife wish, the transplant office is closed for the weekend, on call MD requested for call back.

## 2023-11-11 NOTE — ED Triage Notes (Signed)
 Patient arrived with EMS from home reports left chest pain radiating to left arm and left upper back this evening , no SOB , denies emesis , received ASA 324 mg and 1 NTG sl prior to arrival with relief . CBG=141.

## 2023-11-11 NOTE — Inpatient Diabetes Management (Signed)
 Inpatient Diabetes Program Recommendations  AACE/ADA: New Consensus Statement on Inpatient Glycemic Control (2015)  Target Ranges:  Prepandial:   less than 140 mg/dL      Peak postprandial:   less than 180 mg/dL (1-2 hours)      Critically ill patients:  140 - 180 mg/dL   Lab Results  Component Value Date   GLUCAP 112 (H) 11/11/2023   HGBA1C 6.4 (H) 11/11/2023    Review of Glycemic Control  Latest Reference Range & Units 11/11/23 05:57 11/11/23 09:31  Glucose-Capillary 70 - 99 mg/dL 841 (H) 887 (H)   Diabetes history: DM 1 Outpatient Diabetes medications:  Insulin : -Previously NovoLog  in the pump -Now Fiasp    Pump settings  - basal rates: 12 am: 0.8 >> 0.85 5 am: 0.8 >> 0.75 1 pm: 1 3 pm: 0.9 >> 1 6 pm: 1.1 >> 1.2 >>  1.0 - ICR  12 am: 1:7 >> 8  11 am: 1:7  >> 8 - target: 110-120 >> 110-140 - ISF: 12 am: 50 8 pm: 70 - active insulin  time: 4h TDD from basal insulin : ~11 >> 17-19 units/day >> 57% >> 47% (18.5 units) >> 69% (22.1 units) TDD from bolus insulin : ~21 units >> 4-14 units/day >> 43% >> 53% >> 31%.  9.9 units) Total daily dose 35-60 >> 32-60 units a day    Current orders for Inpatient glycemic control:  Novolog  0-9 units tid with meals and HS Insulin  pump-  Inpatient Diabetes Program Recommendations:    [name] by phone.  He has insulin  pump on and CBG is 112 mg/dL.  He has all supplies at bedside and is alert and oriented.  He is appropriate to continue insulin  pump in the hospital.  Please d/c Novolog  correction tid with meals and HS.   Thanks,  Randall Bullocks, RN, BC-ADM Inpatient Diabetes Coordinator Pager [phone]  (8a-5p)

## 2023-11-11 NOTE — Progress Notes (Signed)
 PHARMACY - ANTICOAGULATION CONSULT NOTE  Pharmacy Consult for Heparin  Indication: chest pain/ACS  Allergies  Allergen Reactions   Tomato Nausea And Vomiting    Immediate vomiting    Other Nausea And Vomiting    RAW Canteloupe, banana, tomatoes, all raw vegetables  Unless the vegetables have dressing on them.  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   Codeine Nausea And Vomiting   Metoclopramide Hcl Nausea And Vomiting    Patient Measurements:   WT ~93 kg  Vital Signs: Temp: 99 F (37.2 C) (07/19 0012) Temp Source: Temporal (07/19 0012) BP: 128/73 (07/19 0415) Pulse Rate: 69 (07/19 0415)  Labs: Recent Labs    11/11/23 0007 11/11/23 0233  HGB 10.7*  --   HCT 33.3*  --   PLT 308  --   CREATININE 1.94*  --   TROPONINIHS 58* 207*    CrCl cannot be calculated (Unknown ideal weight.).   Medical History: Past Medical History:  Diagnosis Date   Anxiety    Depression    Diabetes mellitus    GERD (gastroesophageal reflux disease)    Hypothyroidism    Kidney replaced by transplant    Pancreas transplanted (HCC)    Thyroid  disease     Medications:  Awaiting home med rec  Assessment: 48 y.o. M presents with CP. No AC PTA. Hgb 10.7, plt wnl. To begin heparin .  Goal of Therapy:  Heparin  level 0.3-0.7 units/ml Monitor platelets by anticoagulation protocol: Yes   Plan:  Heparin  IV bolus 4000 units Heparin  gtt at 1250 units/hr Will f/u heparin  level in 6 hours Daily heparin  level and CBC  Vito Ralph, PharmD, BCPS Please see amion for complete clinical pharmacist phone list 11/11/2023,5:11 AM

## 2023-11-11 NOTE — Progress Notes (Signed)
 PHARMACY - ANTICOAGULATION CONSULT NOTE  Pharmacy Consult for Heparin  Indication: chest pain/ACS  Allergies  Allergen Reactions   Tomato Nausea And Vomiting    Immediate vomiting    Other Nausea And Vomiting    RAW Canteloupe, banana, tomatoes, all raw vegetables  Unless the vegetables have dressing on them.  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   Codeine Nausea And Vomiting   Metoclopramide Hcl Nausea And Vomiting    Patient Measurements: Height: 5' 9 (175.3 cm) Weight: 93.8 kg (206 lb 12.7 oz) IBW/kg (Calculated) : 70.7 HEPARIN  DW (KG): 90 WT ~93 kg  Vital Signs: Temp: 98.3 F (36.8 C) (07/19 1205) Temp Source: Oral (07/19 1205) BP: 153/83 (07/19 1205) Pulse Rate: 74 (07/19 1205)  Labs: Recent Labs    11/11/23 0007 11/11/23 0233 11/11/23 0857 11/11/23 1139 11/11/23 1317 11/11/23 1426  HGB 10.7*  --   --   --   --   --   HCT 33.3*  --   --   --   --   --   PLT 308  --   --   --   --   --   HEPARINUNFRC  --   --   --  >1.10*  --  0.23*  CREATININE 1.94*  --   --   --   --   --   TROPONINIHS 58*   < > 524* 601* 593*  --    < > = values in this interval not displayed.    Estimated Creatinine Clearance: 52.6 mL/min (A) (by C-G formula based on SCr of 1.94 mg/dL (H)).   Medical History: Past Medical History:  Diagnosis Date   Anxiety    Depression    Diabetes mellitus    GERD (gastroesophageal reflux disease)    Hypothyroidism    Kidney replaced by transplant    Pancreas transplanted (HCC)    Thyroid  disease     Medications:  Awaiting home med rec  Assessment: 48 y.o. M presents with CP. No AC PTA. Hgb 10.7, plt wnl. Pharmacy consulted for heparin  dosing.  Initial hepairn level 0.26 subtherapeutic on 1250 units/hr (repeated after HL >1.10 drawn from same arm).  CBC stable, no issues noted.  LHC planned on Monday.  Goal of Therapy:  Heparin  level 0.3-0.7 units/ml Monitor platelets by anticoagulation protocol: Yes   Plan:  Increase  heparin  gtt to 1350 units/hr 6h heparin  level Daily heparin  level and CBC F/u after cath Monday  Maurilio Fila, PharmD Clinical Pharmacist 11/11/2023  2:59 PM

## 2023-11-11 NOTE — H&P (Addendum)
 History and Physical    Daniel Hill FMW:[id number] DOB: [birth year] DOA: 11/10/2023  PCP: Thurmond Cathlyn LABOR., MD   Patient coming from: Home   Chief Complaint:  Chief Complaint  Patient presents with   Chest Pain   ED TRIAGE note:  Patient arrived with EMS from home reports left chest pain radiating to left arm and left upper back this evening , no SOB , denies emesis , received ASA 324 mg and 1 NTG sl prior to arrival with relief . CBG=141.     HPI:  Daniel Hill is a 47 y.o. male with medical history significant of renal and pancreatic transplant 2003 and transplant plate 7986 and he is considered too risk for new pancreatic transplant, diabetic retinopathy, diabetic gastroparesis, Hashimoto thyroiditis, hypogonadotrophic hypogonadism, generalized anxiety disorder, depression and longstanding  DM type I who has been present emergency department complaining of left-sided chest pain with radiation to the left-sided arm and upper back.  In the out to ED patient has been given aspirin  325 mg and nitroglycerin  with some relief of the chest pain.  Patient denies any aggravating or relieving factor.    Patient reported left-sided chest pain and pressure that started around 9 PM and resolved around 11 PM after receiving nitroglycerin  and aspirin  by EMS.  Reported no episodes of previous chest pain like this before and previous stress test and echocardiogram normal finding. Patient also endorsing exertional dyspnea, PND and bilateral lower extremity swelling for last couple of months and he takes Lasix  as needed for lower extremity swelling.  Reported that he has previous nuclear medicine stress test and echocardiogram possible in 2013 and findings were normal.   ED Course:  At presentation to ED patient is hemodynamically stable.   Initial troponin 58 trended to 207.  Slight elevated BNP 113. BMP showing elevated potassium 5.2 and elevated creatinine 1.92, low bicarb 17 and elevated BUN  54. CBC showing leukocytes 11.9, stable H&H normal platelet count. EKG showed normal sinus rhythm heart rate 95, minimal ST depression in lead II. Chest x-ray showed cardiomegaly and suspected pulmonary edema.  Dr. Bari consulted cardiology Dr. Charlott recommended to initiate IV heparin  drip in the setting of chest pain and elevated troponin.  Recommended serial troponin check and will evaluate patient soon.  Hospitalist has been consulted for further evaluation management of NSTEMI, chest pain, hyperkalemia and acute kidney injury.  Significant labs in the ED: Lab Orders         CBC with Differential         Basic metabolic panel         Brain natriuretic peptide         Heparin  level (unfractionated)         Heparin  level (unfractionated)         CBC         Urinalysis, Routine w reflex microscopic -Urine, Clean Catch         Creatinine, urine, random         Sodium, urine, random         Lipid panel         Hemoglobin A1c         HIV Antibody (routine testing w rflx)         Comprehensive metabolic panel         Tacrolimus  level         CBG monitoring, ED       Review of Systems:  Review of Systems  Constitutional:  Negative for chills, fever, malaise/fatigue and weight loss.  Respiratory:  Negative for cough.   Cardiovascular:  Positive for chest pain. Negative for palpitations, orthopnea, claudication and leg swelling.  Gastrointestinal:  Negative for abdominal pain, heartburn, nausea and vomiting.  Neurological:  Negative for dizziness and headaches.  Psychiatric/Behavioral:  The patient is not nervous/anxious.     Past Medical History:  Diagnosis Date   Anxiety    Depression    Diabetes mellitus    GERD (gastroesophageal reflux disease)    Hypothyroidism    Kidney replaced by transplant    Pancreas transplanted [facility])    Thyroid  disease     Past Surgical History:  Procedure Laterality Date   EYE SURGERY       reports that he has never smoked. He has  never used smokeless tobacco. He reports that he does not drink alcohol and does not use drugs.  Allergies  Allergen Reactions   Tomato Nausea And Vomiting    Immediate vomiting    Other Nausea And Vomiting    RAW Canteloupe, banana, tomatoes, all raw vegetables  Unless the vegetables have dressing on them.  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   Codeine Nausea And Vomiting   Metoclopramide Hcl Nausea And Vomiting    Family History  Problem Relation Age of Onset   Stroke Mother    Lymphoma Father    Diabetes type II Father     Prior to Admission medications   Medication Sig Start Date End Date Taking? Authorizing Provider  busPIRone  (BUSPAR ) 10 MG tablet Take 10 mg by mouth 2 (two) times daily. 09/25/23  Yes [provider]  cholecalciferol (VITAMIN D3) 25 MCG (1000 UNIT) tablet Take 1,000 Units by mouth daily.   Yes [provider]  esomeprazole  (NEXIUM ) 40 MG capsule Take 40 mg by mouth 2 (two) times daily.   Yes [provider]  FARXIGA  10 MG TABS tablet Take 10 mg by mouth daily.   Yes [provider]  fluticasone (FLONASE) 50 MCG/ACT nasal spray Place 1 spray into both nostrils daily. 06/12/23  Yes [provider]  Insulin  Disposable Pump (OMNIPOD 5 DEXG7G6 PODS GEN 5) MISC Use 1 pod every 3 days 05/24/23  Yes Trixie File, MD  levothyroxine  (SYNTHROID ) 75 MCG tablet TAKE 1 TABLET(75 MCG) BY MOUTH DAILY 02/11/22  Yes Trixie File, MD  METHYLENE BLUE, BULK-SOLID, POWD Take 1 Scoop by mouth daily. Mix with a beverage and drink   Yes [provider]  Multiple Vitamins-Minerals (MULTIVITAMINS THER. W/MINERALS) TABS Take 1 tablet by mouth daily.   Yes [provider]  promethazine  (PHENERGAN ) 25 MG tablet Take 25 mg by mouth every 6 (six) hours as needed.   Yes [provider]  tacrolimus  (PROGRAF ) 1 MG capsule Take 1-2 mg by mouth See admin instructions. Take 2 capsules by mouth in the morning  and 1 capsule at night   Yes [provider]  traMADol (ULTRAM) 50 MG tablet Take 50-100 mg by mouth every 6 (six) hours as needed for pain. 05/16/17  Yes [provider]  VELTASSA 8.4 g packet Take 8.4 g by mouth every other day.   Yes [provider]  Continuous Glucose Sensor (DEXCOM G6 SENSOR) MISC Use as instructed to check blood sugar. Change every 10 days 05/24/23   Trixie File, MD  Continuous Glucose Transmitter (DEXCOM G6 TRANSMITTER) MISC Use as instructed. Change every 90 days 05/24/23   Trixie File, MD  furosemide  (LASIX )  20 MG tablet Take 20 mg by mouth daily as needed for edema (for legs).    [provider]  Glucagon  (BAQSIMI  TWO PACK) 3 MG/DOSE POWD Place 3 mg into the nose once as needed for up to 1 dose. 03/16/20   Trixie File, MD  Insulin  Aspart, w/Niacinamide, (FIASP ) 100 UNIT/ML SOLN INJECT UP TO 50 UNITS VIA INSULIN  PUMP DAILY 05/24/23   Trixie File, MD  SYRINGE/NEEDLE, DISP, 1 ML 23G X 1 1 ML MISC Use every week 07/05/19   Trixie File, MD  TUBERCULIN SYR 1CC/25GX5/8 (B-D TB SYRINGE 1CC/25GX5/8) 25G X 5/8 1 ML MISC USE 1 EVERY 2 WEEKS 03/31/20   Trixie File, MD     Physical Exam: Vitals:   11/11/23 0500 11/11/23 0515 11/11/23 0530 11/11/23 0609  BP: 127/74 125/71 (!) 144/81   Pulse: 65 66 70   Resp:   14   Temp:    98 F (36.7 C)  TempSrc:    Oral  SpO2: 95% 95% 98%     Physical Exam Constitutional:      Appearance: He is not ill-appearing.  Eyes:     Pupils: Pupils are equal, round, and reactive to light.  Cardiovascular:     Rate and Rhythm: Normal rate and regular rhythm.     Heart sounds: Normal heart sounds.  Pulmonary:     Effort: Pulmonary effort is normal.     Breath sounds: Normal breath sounds.  Chest:     Chest wall: No tenderness.  Musculoskeletal:     Right lower leg: Edema present.     Left lower leg: Edema present.  Skin:    General: Skin is warm.     Capillary Refill:  Capillary refill takes less than 2 seconds.  Neurological:     Mental Status: He is alert.  Psychiatric:        Mood and Affect: Mood normal. Mood is not anxious.      Labs on Admission: I have personally reviewed following labs and imaging studies  CBC: Recent Labs  Lab 11/11/23 0007  WBC 11.9*  NEUTROABS 9.1*  HGB 10.7*  HCT 33.3*  MCV 97.7  PLT 308   Basic Metabolic Panel: Recent Labs  Lab 11/11/23 0007  NA 139  K 5.2*  CL 114*  CO2 17*  GLUCOSE 124*  BUN 50*  CREATININE 1.94*  CALCIUM  9.3   GFR: CrCl cannot be calculated (Unknown ideal weight.). Liver Function Tests: No results for input(s): AST, ALT, ALKPHOS, BILITOT, PROT, ALBUMIN in the last 168 hours. No results for input(s): LIPASE, AMYLASE in the last 168 hours. No results for input(s): AMMONIA in the last 168 hours. Coagulation Profile: No results for input(s): INR, PROTIME in the last 168 hours. Cardiac Enzymes: Recent Labs  Lab 11/11/23 0007 11/11/23 0233  TROPONINIHS 58* 207*   BNP (last 3 results) Recent Labs    11/11/23 0007  BNP 113.2*   HbA1C: No results for input(s): HGBA1C in the last 72 hours. CBG: Recent Labs  Lab 11/11/23 0557  GLUCAP 158*   Lipid Profile: No results for input(s): CHOL, HDL, LDLCALC, TRIG, CHOLHDL, LDLDIRECT in the last 72 hours. Thyroid  Function Tests: No results for input(s): TSH, T4TOTAL, FREET4, T3FREE, THYROIDAB in the last 72 hours. Anemia Panel: No results for input(s): VITAMINB12, FOLATE, FERRITIN, TIBC, IRON, RETICCTPCT in the last 72 hours. Urine analysis:    Component Value Date/Time   COLORURINE YELLOW 05/19/2011 0014   APPEARANCEUR CLEAR 05/19/2011 0014   LABSPEC 1.035 (  H) 05/19/2011 0014   PHURINE 6.0 05/19/2011 0014   GLUCOSEU >1000 (A) 05/19/2011 0014   HGBUR NEGATIVE 05/19/2011 0014   BILIRUBINUR NEGATIVE 05/19/2011 0014   KETONESUR 40 (A) 05/19/2011 0014   PROTEINUR  NEGATIVE 05/19/2011 0014   UROBILINOGEN 0.2 05/19/2011 0014   NITRITE NEGATIVE 05/19/2011 0014   LEUKOCYTESUR NEGATIVE 05/19/2011 0014    Radiological Exams on Admission: I have personally reviewed images DG Chest Portable 1 View Result Date: 11/11/2023 EXAM: 1 VIEW XRAY OF THE CHEST 11/11/2023 12:12:59 AM COMPARISON: CT chest dated 11/25/2022. CLINICAL HISTORY: CP. FINDINGS: LUNGS AND PLEURA: Mild pulmonary edema. No pleural effusion. No pneumothorax. HEART AND MEDIASTINUM: Mild cardiomegaly. BONES AND SOFT TISSUES: No acute osseous abnormality. IMPRESSION: 1. Mild cardiomegaly with suspected very mild pulmonary edema. Electronically signed by: Pinkie Pebbles MD 11/11/2023 12:20 AM EDT RP Workstation: [id number]     EKG: My personal interpretation of EKG shows: EKG showed normal sinus rhythm heart rate 95, minimal ST depression in lead II.    Assessment/Plan: Principal Problem:   NSTEMI (non-ST elevated myocardial infarction) (HCC) Active Problems:   Hyperkalemia   History of renal transplant   Hypothyroidism due to Hashimoto's thyroiditis   Hypogonadotropic hypogonadism (HCC)   Chest pain   AKI (acute kidney injury) (HCC)   History of pancreas transplant (HCC)   Insulin  dependent type 1 diabetes mellitus (HCC)   GAD (generalized anxiety disorder)   GERD (gastroesophageal reflux disease)   Pulmonary edema   Elevated blood pressure reading    Assessment and Plan: NSTEMI Chest pain -Presented emergency department complaining of left-sided chest pain with radiation to the shoulder and upper back.  Patient denies any associated nausea or diaphoresis.  Reported previous history of chest pain with further workup showed negative stress test and normal echocardiogram. At presentation to ED patient is hemodynamically stable.   Initial troponin 58 trended to 207.  Slight elevated BNP 113. BMP showing elevated potassium 5.2 and elevated creatinine 1.92, low bicarb 17 and elevated BUN  54. CBC showing leukocytes 11.9, stable H&H normal platelet count. EKG showed normal sinus rhythm heart rate 95, minimal ST depression in lead II. Chest x-ray showed cardiomegaly and suspected pulmonary edema.  Dr. Bari consulted cardiology Dr. Charlott recommended to initiate IV heparin  drip in the setting of chest pain and elevated troponin.  Recommended serial troponin check and will evaluate patient soon. -Continue to trend troponin. -Continue IV heparin  drip. - In the out to ED patient has been given nitroglycerin  and aspirin  load 324 mg with the chest pain has been improved. - Continue aspirin  81 mg daily and starting Lipitor 40 mg daily. Continue sublingual nitroglycerin  as needed. - Check A1c and lipid panel. -Continue cardiac monitoring. - Obtaining echocardiogram to assess for wall motion abnormality.  Elevated blood pressure Pulmonary edema, orthopnea, dyspnea, lower extremity swelling-concern for underlying CHF - Denies any previous history of hypertension.  Patient reported he has orthopnea, dyspnea and bilateral lower extremity swelling he takes Lasix  as needed.   -Per chart review previous echo from 06/2016 showed normal EF 60 to 65%. -Today patient found hypertensive at presentation to ED.   -Chest x-ray showed cardiomegaly and pulmonary edema.  BNP slightly elevated 111.  Also found to have elevated creatinine 1.94. - Giving IV Lasix  20 mg and IV hydralazine  5 mg. - Obtaining echocardiogram. - Monitor urine output.  Hyperkalemia -Elevated potassium 5.2.  Treating with hyperkalemia cocktail NovoLog  and Lokelma .  Acute kidney injury Elevated creatinine 1.94.  Concern for prerenal acute  kidney injury in the setting of chest pain and nstemi. -Checking UA, urine creatinine and urine sodium. -Avoid nephrotoxic agent.   History of renal transplant History of pancreas transplant - Checking Tac level and continue Prograf .  History of insulin -dependent DM type I -Patient  has Dexcom and insulin  pump attached to the thigh .  Patient reported he is unable to don of the insulin  pump. -Continue insulin  pump and consult inpatient diabetic educator as well.  Generalized anxiety disorder -Continue BuSpar   GERD -Continue Protonix   History of Hashimoto thyroiditis with hypothyroidism - Continue levothyroxine  75 mcg daily  Green-colored urine - Patient reported he takes methylene blue to support his health at cellular level and methylene blue can cause green color urine.  DVT prophylaxis:  IV heparin  gtts Code Status:  Full Code Diet: Currently n.p.o. Family Communication:   Family was present at bedside, at the time of interview. Opportunity was given to ask question and all questions were answered satisfactorily.  Disposition Plan: Continue to trend troponin and need to follow-up with your cardiogram result.  Pending cardiology formal consult. Consults: Cardiology Admission status:   Inpatient, Step Down Unit  Severity of Illness: The appropriate patient status for this patient is INPATIENT. Inpatient status is judged to be reasonable and necessary in order to provide the required intensity of service to ensure the patient's safety. The patient's presenting symptoms, physical exam findings, and initial radiographic and laboratory data in the context of their chronic comorbidities is felt to place them at high risk for further clinical deterioration. Furthermore, it is not anticipated that the patient will be medically stable for discharge from the hospital within 2 midnights of admission.   * I certify that at the point of admission it is my clinical judgment that the patient will require inpatient hospital care spanning beyond 2 midnights from the point of admission due to high intensity of service, high risk for further deterioration and high frequency of surveillance required.DEWAINE    Jaymen Fetch, MD [facility]  How to contact the [facility] Attending or  Consulting provider 7A - 7P or covering provider during after hours 7P -7A, for this patient.  Check the care team in [facility] and look for a) attending/consulting [facility] provider listed and b) the [facility] team listed Log into [email or web address] and use [place]'s universal password to access. If you do not have the password, please contact the hospital operator. Locate the [facility] provider you are looking for under [facility] and page to a number that you can be directly reached. If you still have difficulty reaching the provider, please page the [facility] (Director on Call) for the Hospitalists listed on amion for assistance.  11/11/2023, 6:13 AM

## 2023-11-11 NOTE — Progress Notes (Signed)
 PHARMACY - ANTICOAGULATION CONSULT NOTE  Pharmacy Consult for Heparin  Indication: chest pain/ACS  Allergies  Allergen Reactions   Tomato Nausea And Vomiting    Immediate vomiting    Other Nausea And Vomiting    RAW Canteloupe, banana, tomatoes, all raw vegetables  Unless the vegetables have dressing on them.  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   Codeine Nausea And Vomiting   Metoclopramide Hcl Nausea And Vomiting    Patient Measurements: Height: 5' 9 (175.3 cm) Weight: 93.8 kg (206 lb 12.7 oz) IBW/kg (Calculated) : 70.7 HEPARIN  DW (KG): 90 WT ~93 kg  Vital Signs: Temp: 98 F (36.7 C) (07/19 1941) Temp Source: Oral (07/19 1941) BP: 159/88 (07/19 1941) Pulse Rate: 71 (07/19 1941)  Labs: Recent Labs    11/11/23 0007 11/11/23 0233 11/11/23 1139 11/11/23 1317 11/11/23 1426 11/11/23 1605 11/11/23 2058  HGB 10.7*  --   --   --   --   --   --   HCT 33.3*  --   --   --   --   --   --   PLT 308  --   --   --   --   --   --   HEPARINUNFRC  --   --  >1.10*  --  0.23*  --  0.23*  CREATININE 1.94*  --   --   --   --   --   --   TROPONINIHS 58*   < > 601* 593*  --  564*  --    < > = values in this interval not displayed.    Estimated Creatinine Clearance: 52.6 mL/min (A) (by C-G formula based on SCr of 1.94 mg/dL (H)).   Medical History: Past Medical History:  Diagnosis Date   Anxiety    Depression    Diabetes mellitus    GERD (gastroesophageal reflux disease)    Hypothyroidism    Kidney replaced by transplant    Pancreas transplanted [facility])    Thyroid  disease     Medications:  Awaiting home med rec  Assessment: 48 y.o. M presents with CP. No AC PTA. Hgb 10.7, plt wnl. Pharmacy consulted for heparin  dosing.  Initial hepairn level 0.26 subtherapeutic on 1250 units/hr (repeated after HL >1.10 drawn from same arm).  CBC stable, no issues noted.  LHC planned on Monday.  PM: heparin  level 0.23 is subtherapeutic on  heparin  1350 units/hr. No issues  with the infusion or bleeding documented.  Goal of Therapy:  Heparin  level 0.3-0.7 units/ml Monitor platelets by anticoagulation protocol: Yes   Plan:  Increase heparin  gtt to 1500 units/hr 6h heparin  level with AM labs Daily heparin  level and CBC F/u after cath Monday  Rocky Slade, PharmD, BCPS Clinical Pharmacist 11/11/2023  9:49 PM  Please check AMION for all [facility] Pharmacy phone numbers After 10:00 PM, call Main Pharmacy [phone]

## 2023-11-11 NOTE — Progress Notes (Signed)
 PROGRESS NOTE    Daniel Hill  FMW:[id number] DOB: 09/03/1975 DOA: 11/10/2023 PCP: Thurmond Cathlyn LABOR., MD    Brief Narrative:  48 year old with history of renal and pancreatic transplant 2003, diabetic retinopathy, diabetic gastroparesis, Hashimoto's thyroiditis, hypogonadotrophic hypogonadism, GAD, depression, and DM1 presented to the ED with complaining of left-sided chest pain.  Initially given aspirin .  Initial troponins trended up to 07, chest x-ray showed minimal ST depression in lead II otherwise normal sinus rhythm.  EDP consulted cardiology recommending heparin  drip.   Assessment & Plan:  Principal Problem:   NSTEMI (non-ST elevated myocardial infarction) (HCC) Active Problems:   Hyperkalemia   History of renal transplant   Hypothyroidism due to Hashimoto's thyroiditis   Hypogonadotropic hypogonadism (HCC)   Chest pain   AKI (acute kidney injury) (HCC)   History of pancreas transplant (HCC)   Insulin  dependent type 1 diabetes mellitus (HCC)   GAD (generalized anxiety disorder)   GERD (gastroesophageal reflux disease)   Pulmonary edema   Elevated blood pressure reading    NSTEMI Chest pain - Troponins continue to rise, remains on heparin  drip.  EKG is nonischemic.  Plans for [facility] on Monday - A1c 6.4, LDL pending - Echocardiogram -Aspirin  325 mg given, continue 81 mg daily   Elevated blood pressure Pulmonary edema, orthopnea, dyspnea, lower extremity swelling-concern for underlying CHF Echocardiogram in 2018 showed preserved EF, will repeat echocardiogram.    Hyperkalemia -Elevated potassium 5.2.  Treating with hyperkalemia cocktail NovoLog  and Lokelma .   CKD stage IIIb Baseline creatinine 1.8, admission creatinine 1.94 - IV fluids.  Will hydrate as best as possible in anticipation for contrast for left heart cath.     History of renal transplant History of pancreas transplant - Checking Tac level and continue Prograf .   History of insulin -dependent DM type  I -Sliding scale and Accu-Cheks.  Reportedly on insulin  pump   Generalized anxiety disorder -Continue BuSpar    GERD -Continue Protonix    History of Hashimoto thyroiditis with hypothyroidism - Continue levothyroxine  75 mcg daily   Green-colored urine - Patient reported he takes methylene blue to support his health at cellular level and methylene blue can cause green color urine.   DVT prophylaxis:  IV heparin  gtts Code Status:  Full Code Diet: Cardiac Family Communication: Wife at bedside Disposition Plan: Plans for [facility] on Monday  Subjective: Seen and examined at bedside.  Denies any chest pain.  Understands the risk of left heart catheterization and contrast in the setting of CKD 3B and renal transplant.   Examination:  General exam: Appears calm and comfortable  Respiratory system: Clear to auscultation. Respiratory effort normal. Cardiovascular system: S1 & S2 heard, RRR. No JVD, murmurs, rubs, gallops or clicks. No pedal edema. Gastrointestinal system: Abdomen is nondistended, soft and nontender. No organomegaly or masses felt. Normal bowel sounds heard. Central nervous system: Alert and oriented. No focal neurological deficits. Extremities: Symmetric 5 x 5 power. Skin: No rashes, lesions or ulcers Psychiatry: Judgement and insight appear normal. Mood & affect appropriate.                Diet Orders (From admission, onward)     Start     Ordered   11/11/23 1057  Diet heart healthy/carb modified Room service appropriate? Yes; Fluid consistency: Thin  Diet effective now       Question Answer Comment  Diet-HS Snack? Nothing   Room service appropriate? Yes   Fluid consistency: Thin      11/11/23 1056  Objective: Vitals:   11/11/23 0630 11/11/23 0652 11/11/23 0735 11/11/23 1205  BP: (!) 164/84  (!) 144/81 (!) 153/83  Pulse: 82   74  Resp: 18  16   Temp:  98.1 F (36.7 C)  98.3 F (36.8 C)  TempSrc:  Oral  Oral  SpO2: 98%  94%    Weight:  93.8 kg    Height:  5' 9 (1.753 m)      Intake/Output Summary (Last 24 hours) at 11/11/2023 1322 Last data filed at 11/11/2023 1200 Gross per 24 hour  Intake 215.76 ml  Output 800 ml  Net -584.24 ml   Filed Weights   11/11/23 0652  Weight: 93.8 kg    Scheduled Meds:  aspirin  EC  81 mg Oral Daily   atorvastatin   40 mg Oral Daily   busPIRone   10 mg Oral BID   Chlorhexidine  Gluconate Cloth  6 each Topical Daily   insulin  aspart  5 Units Intravenous Once   insulin  pump   Subcutaneous Q4H   levothyroxine   75 mcg Oral Q0600   pantoprazole   40 mg Oral BID   sodium chloride  flush  3 mL Intravenous Q12H   sodium chloride  flush  3 mL Intravenous Q12H   tacrolimus   1 mg Oral QHS   tacrolimus   2 mg Oral Daily   Continuous Infusions:  sodium chloride      sodium chloride  75 mL/hr at 11/11/23 1200   heparin  1,250 Units/hr (11/11/23 1200)    Nutritional status     Body mass index is 30.54 kg/m.  Data Reviewed:   CBC: Recent Labs  Lab 11/11/23 0007  WBC 11.9*  NEUTROABS 9.1*  HGB 10.7*  HCT 33.3*  MCV 97.7  PLT 308   Basic Metabolic Panel: Recent Labs  Lab 11/11/23 0007  NA 139  K 5.2*  CL 114*  CO2 17*  GLUCOSE 124*  BUN 50*  CREATININE 1.94*  CALCIUM  9.3   GFR: Estimated Creatinine Clearance: 52.6 mL/min (A) (by C-G formula based on SCr of 1.94 mg/dL (H)). Liver Function Tests: No results for input(s): AST, ALT, ALKPHOS, BILITOT, PROT, ALBUMIN in the last 168 hours. No results for input(s): LIPASE, AMYLASE in the last 168 hours. No results for input(s): AMMONIA in the last 168 hours. Coagulation Profile: No results for input(s): INR, PROTIME in the last 168 hours. Cardiac Enzymes: No results for input(s): CKTOTAL, CKMB, CKMBINDEX, TROPONINI in the last 168 hours. BNP (last 3 results) No results for input(s): PROBNP in the last 8760 hours. HbA1C: Recent Labs    11/11/23 0524  HGBA1C 6.4*   CBG: Recent  Labs  Lab 11/11/23 0557 11/11/23 0931 11/11/23 1203  GLUCAP 158* 112* 159*   Lipid Profile: Recent Labs    11/11/23 0524  CHOL 165  HDL 54  LDLCALC 104*  TRIG 35  CHOLHDL 3.1   Thyroid  Function Tests: No results for input(s): TSH, T4TOTAL, FREET4, T3FREE, THYROIDAB in the last 72 hours. Anemia Panel: No results for input(s): VITAMINB12, FOLATE, FERRITIN, TIBC, IRON, RETICCTPCT in the last 72 hours. Sepsis Labs: No results for input(s): PROCALCITON, LATICACIDVEN in the last 168 hours.  Recent Results (from the past 240 hours)  MRSA Next Gen by PCR, Nasal     Status: None   Collection Time: 11/11/23  6:56 AM   Specimen: Nasal Mucosa; Nasal Swab  Result Value Ref Range Status   MRSA by PCR Next Gen NOT DETECTED NOT DETECTED Final    Comment: (NOTE) The GeneXpert MRSA Assay (  FDA approved for NASAL specimens only), is one component of a comprehensive MRSA colonization surveillance program. It is not intended to diagnose MRSA infection nor to guide or monitor treatment for MRSA infections. Test performance is not FDA approved in patients less than 57 years old. Performed at [facility], [address] [place], [place] [address]          Radiology Studies: ECHOCARDIOGRAM COMPLETE Result Date: 11/11/2023    ECHOCARDIOGRAM REPORT   Patient Name:   Daniel Hill Date of Exam: 11/11/2023 Medical Rec #:  [id number]       Height:       69.0 in Accession #:    [id number]      Weight:       206.8 lb Date of Birth:  [birth year]        BSA:          2.096 m Patient Age:    47 years        BP:           144/81 mmHg Patient Gender: M               HR:           81 bpm. Exam Location:  Inpatient Procedure: 2D Echo, Cardiac Doppler and Color Doppler (Both Spectral and Color            Flow Doppler were utilized during procedure). Indications:    Chest Pain R07.9  History:        Patient has no prior history of Echocardiogram examinations.                 Risk  Factors:Diabetes.  Sonographer:    Jayson Gaskins Referring Phys: [id number] SUBRINA SUNDIL IMPRESSIONS  1. Left ventricular ejection fraction, by estimation, is 60 to 65%. The left ventricle has normal function. The left ventricle has no regional wall motion abnormalities. Left ventricular diastolic parameters were normal.  2. Right ventricular systolic function is normal. The right ventricular size is normal. Tricuspid regurgitation signal is inadequate for assessing PA pressure.  3. The mitral valve is normal in structure. Mild mitral valve regurgitation. No evidence of mitral stenosis.  4. The aortic valve is normal in structure. Aortic valve regurgitation is not visualized. No aortic stenosis is present.  5. The inferior vena cava is normal in size with greater than 50% respiratory variability, suggesting right atrial pressure of 3 mmHg. FINDINGS  Left Ventricle: Left ventricular ejection fraction, by estimation, is 60 to 65%. The left ventricle has normal function. The left ventricle has no regional wall motion abnormalities. The left ventricular internal cavity size was normal in size. There is  no left ventricular hypertrophy. Left ventricular diastolic parameters were normal. Right Ventricle: The right ventricular size is normal. No increase in right ventricular wall thickness. Right ventricular systolic function is normal. Tricuspid regurgitation signal is inadequate for assessing PA pressure. Left Atrium: Left atrial size was normal in size. Right Atrium: Right atrial size was normal in size. Pericardium: There is no evidence of pericardial effusion. Mitral Valve: The mitral valve is normal in structure. Mild mitral valve regurgitation, with centrally-directed jet. No evidence of mitral valve stenosis. Tricuspid Valve: The tricuspid valve is normal in structure. Tricuspid valve regurgitation is not demonstrated. No evidence of tricuspid stenosis. Aortic Valve: The aortic valve is normal in structure. Aortic  valve regurgitation is not visualized. No aortic stenosis is present. Aortic valve mean gradient measures 7.0 mmHg. Aortic valve  peak gradient measures 10.6 mmHg. Aortic valve area, by VTI measures 2.87 cm. Pulmonic Valve: The pulmonic valve was normal in structure. Pulmonic valve regurgitation is not visualized. No evidence of pulmonic stenosis. Aorta: The aortic root is normal in size and structure. Venous: The inferior vena cava is normal in size with greater than 50% respiratory variability, suggesting right atrial pressure of 3 mmHg. IAS/Shunts: No atrial level shunt detected by color flow Doppler.  LEFT VENTRICLE PLAX 2D LVIDd:         4.90 cm   Diastology LVIDs:         2.60 cm   LV e' medial:    9.03 cm/s LV PW:         1.10 cm   LV E/e' medial:  13.5 LV IVS:        0.90 cm   LV e' lateral:   12.50 cm/s LVOT diam:     1.90 cm   LV E/e' lateral: 9.8 LV SV:         114 LV SV Index:   54 LVOT Area:     2.84 cm  RIGHT VENTRICLE RV S prime:     13.90 cm/s TAPSE (M-mode): 2.3 cm LEFT ATRIUM             Index        RIGHT ATRIUM           Index LA Vol (A2C):   36.4 ml 17.37 ml/m  RA Area:     10.90 cm LA Vol (A4C):   53.4 ml 25.48 ml/m  RA Volume:   23.80 ml  11.36 ml/m LA Biplane Vol: 45.1 ml 21.52 ml/m  AORTIC VALVE AV Area (Vmax):    2.73 cm AV Area (Vmean):   2.86 cm AV Area (VTI):     2.87 cm AV Vmax:           163.00 cm/s AV Vmean:          126.000 cm/s AV VTI:            0.396 m AV Peak Grad:      10.6 mmHg AV Mean Grad:      7.0 mmHg LVOT Vmax:         157.00 cm/s LVOT Vmean:        127.000 cm/s LVOT VTI:          0.401 m LVOT/AV VTI ratio: 1.01  AORTA Ao Root diam: 2.70 cm MITRAL VALVE MV Area (PHT): 3.58 cm     SHUNTS MV Decel Time: 212 msec     Systemic VTI:  0.40 m MV E velocity: 122.00 cm/s  Systemic Diam: 1.90 cm MV A velocity: 87.00 cm/s MV E/A ratio:  1.40 Mihai Croitoru MD Electronically signed by Jerel Balding MD Signature Date/Time: 11/11/2023/1:08:07 PM    Final    DG Chest Portable  1 View Result Date: 11/11/2023 EXAM: 1 VIEW XRAY OF THE CHEST 11/11/2023 12:12:59 AM COMPARISON: CT chest dated 11/25/2022. CLINICAL HISTORY: CP. FINDINGS: LUNGS AND PLEURA: Mild pulmonary edema. No pleural effusion. No pneumothorax. HEART AND MEDIASTINUM: Mild cardiomegaly. BONES AND SOFT TISSUES: No acute osseous abnormality. IMPRESSION: 1. Mild cardiomegaly with suspected very mild pulmonary edema. Electronically signed by: Pinkie Pebbles MD 11/11/2023 12:20 AM EDT RP Workstation: [id number]           LOS: 0 days   Time spent= 35 mins    Tonio Seider JAYSON Dare, MD [facility]  If 7PM-7AM, please contact night-coverage  11/11/2023, 1:22 PM

## 2023-11-12 DIAGNOSIS — I214 Non-ST elevation (NSTEMI) myocardial infarction: Secondary | ICD-10-CM | POA: Diagnosis not present

## 2023-11-12 LAB — BASIC METABOLIC PANEL WITH GFR
Anion gap: 8 (ref 5–15)
BUN: 42 mg/dL — ABNORMAL HIGH (ref 6–20)
CO2: 15 mmol/L — ABNORMAL LOW (ref 22–32)
Calcium: 8.1 mg/dL — ABNORMAL LOW (ref 8.9–10.3)
Chloride: 117 mmol/L — ABNORMAL HIGH (ref 98–111)
Creatinine, Ser: 1.62 mg/dL — ABNORMAL HIGH (ref 0.61–1.24)
GFR, Estimated: 52 mL/min — ABNORMAL LOW (ref >60)
Glucose, Bld: 162 mg/dL — ABNORMAL HIGH (ref 70–99)
Potassium: 4.3 mmol/L (ref 3.5–5.1)
Sodium: 140 mmol/L (ref 135–145)

## 2023-11-12 LAB — CBC
HCT: 30 % — ABNORMAL LOW (ref 39.0–52.0)
Hemoglobin: 9.5 g/dL — ABNORMAL LOW (ref 13.0–17.0)
MCH: 31.5 pg (ref 26.0–34.0)
MCHC: 31.7 g/dL (ref 30.0–36.0)
MCV: 99.3 fL (ref 80.0–100.0)
Platelets: 256 K/uL (ref 150–400)
RBC: 3.02 MIL/uL — ABNORMAL LOW (ref 4.22–5.81)
RDW: 13.1 % (ref 11.5–15.5)
WBC: 7.5 K/uL (ref 4.0–10.5)
nRBC: 0 % (ref 0.0–0.2)

## 2023-11-12 LAB — COMPREHENSIVE METABOLIC PANEL WITH GFR
ALT: 20 U/L (ref 0–44)
AST: 18 U/L (ref 15–41)
Albumin: 2.6 g/dL — ABNORMAL LOW (ref 3.5–5.0)
Alkaline Phosphatase: 74 U/L (ref 38–126)
Anion gap: 9 (ref 5–15)
BUN: 42 mg/dL — ABNORMAL HIGH (ref 6–20)
CO2: 14 mmol/L — ABNORMAL LOW (ref 22–32)
Calcium: 8.1 mg/dL — ABNORMAL LOW (ref 8.9–10.3)
Chloride: 116 mmol/L — ABNORMAL HIGH (ref 98–111)
Creatinine, Ser: 1.59 mg/dL — ABNORMAL HIGH (ref 0.61–1.24)
GFR, Estimated: 53 mL/min — ABNORMAL LOW (ref >60)
Glucose, Bld: 159 mg/dL — ABNORMAL HIGH (ref 70–99)
Potassium: 4.2 mmol/L (ref 3.5–5.1)
Sodium: 139 mmol/L (ref 135–145)
Total Bilirubin: 0.4 mg/dL (ref 0.0–1.2)
Total Protein: 5.5 g/dL — ABNORMAL LOW (ref 6.5–8.1)

## 2023-11-12 LAB — GLUCOSE, CAPILLARY
Glucose-Capillary: 117 mg/dL — ABNORMAL HIGH (ref 70–99)
Glucose-Capillary: 136 mg/dL — ABNORMAL HIGH (ref 70–99)
Glucose-Capillary: 150 mg/dL — ABNORMAL HIGH (ref 70–99)
Glucose-Capillary: 167 mg/dL — ABNORMAL HIGH (ref 70–99)
Glucose-Capillary: 171 mg/dL — ABNORMAL HIGH (ref 70–99)
Glucose-Capillary: 243 mg/dL — ABNORMAL HIGH (ref 70–99)
Glucose-Capillary: 97 mg/dL (ref 70–99)

## 2023-11-12 LAB — PHOSPHORUS: Phosphorus: 2.5 mg/dL (ref 2.5–4.6)

## 2023-11-12 LAB — SURGICAL PCR SCREEN
MRSA, PCR: NEGATIVE
Staphylococcus aureus: NEGATIVE

## 2023-11-12 LAB — HEPARIN LEVEL (UNFRACTIONATED)
Heparin Unfractionated: >1.1 [IU]/mL — ABNORMAL HIGH (ref 0.30–0.70)
Heparin Unfractionated: 0.35 [IU]/mL (ref 0.30–0.70)

## 2023-11-12 LAB — MAGNESIUM: Magnesium: 2 mg/dL (ref 1.7–2.4)

## 2023-11-12 MED ORDER — METOPROLOL TARTRATE 12.5 MG HALF TABLET
12.5000 mg | ORAL_TABLET | Freq: Two times a day (BID) | ORAL | Status: DC
  Administered 2023-11-12 – 2023-11-13 (×3): 12.5 mg via ORAL
  Filled 2023-11-12 (×3): qty 1

## 2023-11-12 MED ORDER — SODIUM BICARBONATE 650 MG PO TABS
650.0000 mg | ORAL_TABLET | Freq: Two times a day (BID) | ORAL | Status: DC
  Administered 2023-11-12 – 2023-11-14 (×5): 650 mg via ORAL
  Filled 2023-11-12 (×5): qty 1

## 2023-11-12 MED ORDER — ASPIRIN 81 MG PO CHEW
81.0000 mg | CHEWABLE_TABLET | ORAL | Status: AC
  Administered 2023-11-13: 81 mg via ORAL
  Filled 2023-11-12: qty 1

## 2023-11-12 MED ORDER — SODIUM CHLORIDE 0.9 % WEIGHT BASED INFUSION: 3.0000 mL/kg/h | INTRAVENOUS | Status: DC

## 2023-11-12 MED ORDER — SODIUM CHLORIDE 0.9 % WEIGHT BASED INFUSION: 1.0000 mL/kg/h | INTRAVENOUS | Status: DC

## 2023-11-12 MED ORDER — FUROSEMIDE 10 MG/ML IJ SOLN
20.0000 mg | Freq: Once | INTRAMUSCULAR | Status: DC
  Filled 2023-11-12: qty 2

## 2023-11-12 NOTE — Progress Notes (Signed)
 PROGRESS NOTE    Daniel Hill  FMW:[id number] DOB: 28-Nov-1975 DOA: 11/10/2023 PCP: Thurmond Cathlyn LABOR., MD    Brief Narrative:  48 year old with history of renal and pancreatic transplant 2003, diabetic retinopathy, diabetic gastroparesis, Hashimoto's thyroiditis, hypogonadotrophic hypogonadism, GAD, depression, and DM1 presented to the ED with complaining of left-sided chest pain.  Initially given aspirin .  Initial troponins trended up to 07, chest x-ray showed minimal ST depression in lead II otherwise normal sinus rhythm.  EDP consulted cardiology recommending heparin  drip.  Echocardiogram shows preserved EF, A1c 6.4, LDL 104.  Cardiology planning on left heart catheterization on Monday   Assessment & Plan:  Principal Problem:   NSTEMI (non-ST elevated myocardial infarction) [facility]) Active Problems:   Hyperkalemia   History of renal transplant   Hypothyroidism due to Hashimoto's thyroiditis   Hypogonadotropic hypogonadism (HCC)   Chest pain   AKI (acute kidney injury) (HCC)   History of pancreas transplant (HCC)   Insulin  dependent type 1 diabetes mellitus (HCC)   GAD (generalized anxiety disorder)   GERD (gastroesophageal reflux disease)   Pulmonary edema   Elevated blood pressure reading    NSTEMI Chest pain - Troponins continue to rise, remains on heparin  drip.  EKG is nonischemic.  Plans for LHC on Monday - A1c 6.4, LDL 104 - Echocardiogram preserved EF 65% -Aspirin  325 mg given, continue 81 mg daily   Hypertension, uncontrolled Echocardiogram in 2018 showed preserved EF Will defer to cardiology so appropriate medications can be added from a cardiac standpoint.   Hyperkalemia -Elevated potassium 5.2.  Treating with hyperkalemia cocktail NovoLog  and Lokelma .   CKD stage IIIb Metabolic acidosis Baseline creatinine 1.8, admission creatinine 1.94 - IV fluids.  Will hydrate as best as possible in anticipation for contrast for left heart cath. -Will order sodium bicarb      History of renal transplant History of pancreas transplant - Checking Tac level and continue Prograf .   History of insulin -dependent DM type I -Sliding scale and Accu-Cheks.  Reportedly on insulin  pump   Generalized anxiety disorder -Continue BuSpar    GERD -Continue Protonix    History of Hashimoto thyroiditis with hypothyroidism - Continue levothyroxine  75 mcg daily   Green-colored urine - Patient reported he takes methylene blue to support his health at cellular level and methylene blue can cause green color urine.   DVT prophylaxis:  IV heparin  gtts Code Status:  Full Code Diet: Cardiac Family Communication: Wife at bedside Disposition Plan: Plans for [facility] on Monday  Subjective:  Doing okay no complaints  Examination:  General exam: Appears calm and comfortable  Respiratory system: Clear to auscultation. Respiratory effort normal. Cardiovascular system: S1 & S2 heard, RRR. No JVD, murmurs, rubs, gallops or clicks. No pedal edema. Gastrointestinal system: Abdomen is nondistended, soft and nontender. No organomegaly or masses felt. Normal bowel sounds heard. Central nervous system: Alert and oriented. No focal neurological deficits. Extremities: Symmetric 5 x 5 power. Skin: No rashes, lesions or ulcers Psychiatry: Judgement and insight appear normal. Mood & affect appropriate.                Diet Orders (From admission, onward)     Start     Ordered   11/11/23 1057  Diet heart healthy/carb modified Room service appropriate? Yes; Fluid consistency: Thin  Diet effective now       Question Answer Comment  Diet-HS Snack? Nothing   Room service appropriate? Yes   Fluid consistency: Thin      11/11/23 1056  Objective: Vitals:   11/12/23 0300 11/12/23 0425 11/12/23 0741 11/12/23 1012  BP:  (!) 136/116 (!) 170/88 (!) 170/92  Pulse:  72  74  Resp: 18 18 17    Temp:  97.7 F (36.5 C) 97.8 F (36.6 C)   TempSrc:  Oral Oral   SpO2: 93%  94% 94%   Weight:      Height:        Intake/Output Summary (Last 24 hours) at 11/12/2023 1043 Last data filed at 11/12/2023 0852 Gross per 24 hour  Intake 2340.24 ml  Output 1900 ml  Net 440.24 ml   Filed Weights   11/11/23 0652  Weight: 93.8 kg    Scheduled Meds:  aspirin  EC  81 mg Oral Daily   atorvastatin   40 mg Oral Daily   busPIRone   10 mg Oral BID   Chlorhexidine  Gluconate Cloth  6 each Topical Daily   insulin  aspart  5 Units Intravenous Once   insulin  pump   Subcutaneous Q4H   levothyroxine   75 mcg Oral Q0600   metoprolol  tartrate  12.5 mg Oral BID   pantoprazole   40 mg Oral BID   sodium bicarbonate   650 mg Oral BID   sodium chloride  flush  3 mL Intravenous Q12H   sodium chloride  flush  3 mL Intravenous Q12H   tacrolimus   1 mg Oral QHS   tacrolimus   2 mg Oral Daily   Continuous Infusions:  sodium chloride  75 mL/hr at 11/12/23 0701   heparin  1,500 Units/hr (11/12/23 0701)    Nutritional status     Body mass index is 30.54 kg/m.  Data Reviewed:   CBC: Recent Labs  Lab 11/11/23 0007 11/12/23 0911  WBC 11.9* 7.5  NEUTROABS 9.1*  --   HGB 10.7* 9.5*  HCT 33.3* 30.0*  MCV 97.7 99.3  PLT 308 256   Basic Metabolic Panel: Recent Labs  Lab 11/11/23 0007 11/12/23 0911  NA 139 139  140  K 5.2* 4.2  4.3  CL 114* 116*  117*  CO2 17* 14*  15*  GLUCOSE 124* 159*  162*  BUN 50* 42*  42*  CREATININE 1.94* 1.59*  1.62*  CALCIUM  9.3 8.1*  8.1*  MG  --  2.0  PHOS  --  2.5   GFR: Estimated Creatinine Clearance: 64.2 mL/min (A) (by C-G formula based on SCr of 1.59 mg/dL (H)). Liver Function Tests: Recent Labs  Lab 11/12/23 0911  AST 18  ALT 20  ALKPHOS 74  BILITOT 0.4  PROT 5.5*  ALBUMIN 2.6*   No results for input(s): LIPASE, AMYLASE in the last 168 hours. No results for input(s): AMMONIA in the last 168 hours. Coagulation Profile: No results for input(s): INR, PROTIME in the last 168 hours. Cardiac Enzymes: No results for  input(s): CKTOTAL, CKMB, CKMBINDEX, TROPONINI in the last 168 hours. BNP (last 3 results) No results for input(s): PROBNP in the last 8760 hours. HbA1C: Recent Labs    11/11/23 0524  HGBA1C 6.4*   CBG: Recent Labs  Lab 11/11/23 2147 11/11/23 2347 11/12/23 0429 11/12/23 0743 11/12/23 0850  GLUCAP 126* 126* 97 117* 171*   Lipid Profile: Recent Labs    11/11/23 0524  CHOL 165  HDL 54  LDLCALC 104*  TRIG 35  CHOLHDL 3.1   Thyroid  Function Tests: No results for input(s): TSH, T4TOTAL, FREET4, T3FREE, THYROIDAB in the last 72 hours. Anemia Panel: No results for input(s): VITAMINB12, FOLATE, FERRITIN, TIBC, IRON, RETICCTPCT in the last 72 hours. Sepsis Labs: No  results for input(s): PROCALCITON, LATICACIDVEN in the last 168 hours.  Recent Results (from the past 240 hours)  MRSA Next Gen by PCR, Nasal     Status: None   Collection Time: 11/11/23  6:56 AM   Specimen: Nasal Mucosa; Nasal Swab  Result Value Ref Range Status   MRSA by PCR Next Gen NOT DETECTED NOT DETECTED Final    Comment: (NOTE) The GeneXpert MRSA Assay (FDA approved for NASAL specimens only), is one component of a comprehensive MRSA colonization surveillance program. It is not intended to diagnose MRSA infection nor to guide or monitor treatment for MRSA infections. Test performance is not FDA approved in patients less than 62 years old. Performed at [facility], [address], [place], [place] [address]   Surgical pcr screen     Status: None   Collection Time: 11/12/23  2:08 AM   Specimen: Nasal Mucosa; Nasal Swab  Result Value Ref Range Status   MRSA, PCR NEGATIVE NEGATIVE Final   Staphylococcus aureus NEGATIVE NEGATIVE Final    Comment: (NOTE) The Xpert SA Assay (FDA approved for NASAL specimens in patients 40 years of age and older), is one component of a comprehensive surveillance program. It is not intended to diagnose infection nor to guide or  monitor treatment. Performed at [facility], [address] [place], [place] [address]          Radiology Studies: ECHOCARDIOGRAM COMPLETE Result Date: 11/11/2023    ECHOCARDIOGRAM REPORT   Patient Name:   Daniel Hill Date of Exam: 11/11/2023 Medical Rec #:  [id number]       Height:       69.0 in Accession #:    [id number]      Weight:       206.8 lb Date of Birth:  12-28-1975        BSA:          2.096 m Patient Age:    47 years        BP:           144/81 mmHg Patient Gender: M               HR:           81 bpm. Exam Location:  Inpatient Procedure: 2D Echo, Cardiac Doppler and Color Doppler (Both Spectral and Color            Flow Doppler were utilized during procedure). Indications:    Chest Pain R07.9  History:        Patient has no prior history of Echocardiogram examinations.                 Risk Factors:Diabetes.  Sonographer:    Jayson Gaskins Referring Phys: [id number] SUBRINA SUNDIL IMPRESSIONS  1. Left ventricular ejection fraction, by estimation, is 60 to 65%. The left ventricle has normal function. The left ventricle has no regional wall motion abnormalities. Left ventricular diastolic parameters were normal.  2. Right ventricular systolic function is normal. The right ventricular size is normal. Tricuspid regurgitation signal is inadequate for assessing PA pressure.  3. The mitral valve is normal in structure. Mild mitral valve regurgitation. No evidence of mitral stenosis.  4. The aortic valve is normal in structure. Aortic valve regurgitation is not visualized. No aortic stenosis is present.  5. The inferior vena cava is normal in size with greater than 50% respiratory variability, suggesting right atrial pressure of 3 mmHg. FINDINGS  Left Ventricle: Left  ventricular ejection fraction, by estimation, is 60 to 65%. The left ventricle has normal function. The left ventricle has no regional wall motion abnormalities. The left ventricular internal cavity size was normal in size. There is   no left ventricular hypertrophy. Left ventricular diastolic parameters were normal. Right Ventricle: The right ventricular size is normal. No increase in right ventricular wall thickness. Right ventricular systolic function is normal. Tricuspid regurgitation signal is inadequate for assessing PA pressure. Left Atrium: Left atrial size was normal in size. Right Atrium: Right atrial size was normal in size. Pericardium: There is no evidence of pericardial effusion. Mitral Valve: The mitral valve is normal in structure. Mild mitral valve regurgitation, with centrally-directed jet. No evidence of mitral valve stenosis. Tricuspid Valve: The tricuspid valve is normal in structure. Tricuspid valve regurgitation is not demonstrated. No evidence of tricuspid stenosis. Aortic Valve: The aortic valve is normal in structure. Aortic valve regurgitation is not visualized. No aortic stenosis is present. Aortic valve mean gradient measures 7.0 mmHg. Aortic valve peak gradient measures 10.6 mmHg. Aortic valve area, by VTI measures 2.87 cm. Pulmonic Valve: The pulmonic valve was normal in structure. Pulmonic valve regurgitation is not visualized. No evidence of pulmonic stenosis. Aorta: The aortic root is normal in size and structure. Venous: The inferior vena cava is normal in size with greater than 50% respiratory variability, suggesting right atrial pressure of 3 mmHg. IAS/Shunts: No atrial level shunt detected by color flow Doppler.  LEFT VENTRICLE PLAX 2D LVIDd:         4.90 cm   Diastology LVIDs:         2.60 cm   LV e' medial:    9.03 cm/s LV PW:         1.10 cm   LV E/e' medial:  13.5 LV IVS:        0.90 cm   LV e' lateral:   12.50 cm/s LVOT diam:     1.90 cm   LV E/e' lateral: 9.8 LV SV:         114 LV SV Index:   54 LVOT Area:     2.84 cm  RIGHT VENTRICLE RV S prime:     13.90 cm/s TAPSE (M-mode): 2.3 cm LEFT ATRIUM             Index        RIGHT ATRIUM           Index LA Vol (A2C):   36.4 ml 17.37 ml/m  RA Area:      10.90 cm LA Vol (A4C):   53.4 ml 25.48 ml/m  RA Volume:   23.80 ml  11.36 ml/m LA Biplane Vol: 45.1 ml 21.52 ml/m  AORTIC VALVE AV Area (Vmax):    2.73 cm AV Area (Vmean):   2.86 cm AV Area (VTI):     2.87 cm AV Vmax:           163.00 cm/s AV Vmean:          126.000 cm/s AV VTI:            0.396 m AV Peak Grad:      10.6 mmHg AV Mean Grad:      7.0 mmHg LVOT Vmax:         157.00 cm/s LVOT Vmean:        127.000 cm/s LVOT VTI:          0.401 m LVOT/AV VTI ratio: 1.01  AORTA Ao Root diam: 2.70 cm  MITRAL VALVE MV Area (PHT): 3.58 cm     SHUNTS MV Decel Time: 212 msec     Systemic VTI:  0.40 m MV E velocity: 122.00 cm/s  Systemic Diam: 1.90 cm MV A velocity: 87.00 cm/s MV E/A ratio:  1.40 Mihai Croitoru MD Electronically signed by Jerel Balding MD Signature Date/Time: 11/11/2023/1:08:07 PM    Final    DG Chest Portable 1 View Result Date: 11/11/2023 EXAM: 1 VIEW XRAY OF THE CHEST 11/11/2023 12:12:59 AM COMPARISON: CT chest dated 11/25/2022. CLINICAL HISTORY: CP. FINDINGS: LUNGS AND PLEURA: Mild pulmonary edema. No pleural effusion. No pneumothorax. HEART AND MEDIASTINUM: Mild cardiomegaly. BONES AND SOFT TISSUES: No acute osseous abnormality. IMPRESSION: 1. Mild cardiomegaly with suspected very mild pulmonary edema. Electronically signed by: Pinkie Pebbles MD 11/11/2023 12:20 AM EDT RP Workstation: [id number]           LOS: 1 day   Time spent= 35 mins    Burgess JAYSON Dare, MD [facility]  If 7PM-7AM, please contact night-coverage  11/12/2023, 10:43 AM

## 2023-11-12 NOTE — Plan of Care (Signed)
  Problem: Fluid Volume: Goal: Ability to maintain a balanced intake and output will improve Outcome: Progressing   Problem: Health Behavior/Discharge Planning: Goal: Ability to identify and utilize available resources and services will improve Outcome: Progressing Goal: Ability to manage health-related needs will improve Outcome: Progressing   Problem: Metabolic: Goal: Ability to maintain appropriate glucose levels will improve Outcome: Progressing   Problem: Nutritional: Goal: Maintenance of adequate nutrition will improve Outcome: Progressing   Problem: Tissue Perfusion: Goal: Adequacy of tissue perfusion will improve Outcome: Progressing   Problem: Education: Goal: Knowledge of General Education information will improve Description: Including pain rating scale, medication(s)/side effects and non-pharmacologic comfort measures Outcome: Progressing   Problem: Clinical Measurements: Goal: Ability to maintain clinical measurements within normal limits will improve Outcome: Progressing   Problem: Activity: Goal: Risk for activity intolerance will decrease Outcome: Progressing   Problem: Nutrition: Goal: Adequate nutrition will be maintained Outcome: Progressing   Problem: Safety: Goal: Ability to remain free from injury will improve Outcome: Progressing   Problem: Skin Integrity: Goal: Risk for impaired skin integrity will decrease Outcome: Progressing

## 2023-11-12 NOTE — Progress Notes (Signed)
   11/12/23 2344  Provider Notification  Provider Name/Title Dr. Lee  Date Provider Notified 11/12/23  Time Provider Notified 2346  Method of Notification Page  Notification Reason Pt has a compliant of SOB, RR 18-20, SPO2 94-96% on room air. Lung sound clear bilaterally. His wife asks if he can have lasix . His SBP was above 180 mmHg while he was having the complaint of SOB. Hydralazine  10 mg IV given.)  Provider response Evaluate remotely;See new order for Lasix  20 mg IV once.  Date of Provider Response 11/12/23  Time of Provider Response 2347   Wendi Dash, RN

## 2023-11-12 NOTE — Progress Notes (Signed)
 PHARMACY - ANTICOAGULATION CONSULT NOTE  Pharmacy Consult for Heparin  Indication: chest pain/ACS  Allergies  Allergen Reactions   Tomato Nausea And Vomiting    Immediate vomiting    Other Nausea And Vomiting    RAW Canteloupe, banana, tomatoes, all raw vegetables  Unless the vegetables have dressing on them.  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   Codeine Nausea And Vomiting   Metoclopramide Hcl Nausea And Vomiting    Patient Measurements: Height: 5' 9 (175.3 cm) Weight: 93.8 kg (206 lb 12.7 oz) IBW/kg (Calculated) : 70.7 HEPARIN  DW (KG): 90 WT ~93 kg  Vital Signs: Temp: 97.8 F (36.6 C) (07/20 1156) Temp Source: Oral (07/20 1156) BP: 164/79 (07/20 1156) Pulse Rate: 74 (07/20 1012)  Labs: Recent Labs    11/11/23 0007 11/11/23 0233 11/11/23 1139 11/11/23 1317 11/11/23 1426 11/11/23 1605 11/11/23 2058 11/12/23 0911 11/12/23 1345  HGB 10.7*  --   --   --   --   --   --  9.5*  --   HCT 33.3*  --   --   --   --   --   --  30.0*  --   PLT 308  --   --   --   --   --   --  256  --   HEPARINUNFRC  --   --  >1.10*  --    < >  --  0.23* >1.10* 0.35  CREATININE 1.94*  --   --   --   --   --   --  1.59*  1.62*  --   TROPONINIHS 58*   < > 601* 593*  --  564*  --   --   --    < > = values in this interval not displayed.    Estimated Creatinine Clearance: 64.2 mL/min (A) (by C-G formula based on SCr of 1.59 mg/dL (H)).   Medical History: Past Medical History:  Diagnosis Date   Anxiety    Depression    Diabetes mellitus    GERD (gastroesophageal reflux disease)    Hypothyroidism    Kidney replaced by transplant    Pancreas transplanted [facility])    Thyroid  disease     Medications:  Awaiting home med rec  Assessment: 48 y.o. M presents with CP. No AC PTA. Hgb 10.7, plt wnl. Pharmacy consulted for heparin  dosing.  7/20 1500: Heparin  level this morning was >1.1, supra-therapeutic due to drawing lab from same site as the heparin  infusion. Heparin  level  0.35, therapeutic on heparin  1500 units/hr. No issues with infusion or bleeding per RN. Heparin  infusing through right arm and labs appropriately drawn from the hand. 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 Hgb 9.5, slight decrease from yesterday. PLT stable at 256. LHC planned for Monday.   Goal of Therapy:  Heparin  level 0.3-0.7 units/ml Monitor platelets by anticoagulation protocol: Yes   Plan:  Continue heparin  at 1500 units/hr Daily heparin  level and CBC F/u after cath Monday  Morna Breach, PharmD PGY2 Cardiology Pharmacy Resident 11/12/2023 2:51 PM  Please check AMION for all [facility] Pharmacy phone numbers After 10:00 PM, call Main Pharmacy [phone]

## 2023-11-12 NOTE — Progress Notes (Signed)
 Plan of care is reviewed. Pt has been progressing. He is alert, fully oriented x 4, afebrile, stable hemodynamically, NSR on the monitor, normal respiratory effort.  Pt denies chest pain. He is able to ambulate independently to the bathroom. Continue Heparin  gtt. Pt is able to rest well overnight with no major complaints. No obvious acute distress. We will continue to monitor.    Wendi Dash, RN

## 2023-11-12 NOTE — Progress Notes (Signed)
   Rounding Note    Patient Name: Daniel Hill Date of Encounter: 11/12/2023  [facility] Cardiologist: None   Subjective   NAEO. No chest pain this AM.  Vital Signs    Vitals:   11/12/23 0200 11/12/23 0300 11/12/23 0425 11/12/23 0741  BP:   (!) 136/116 (!) 170/88  Pulse:   72   Resp: 18 18 18 17   Temp:   97.7 F (36.5 C) 97.8 F (36.6 C)  TempSrc:   Oral Oral  SpO2: 94% 93% 94% 94%  Weight:      Height:        Intake/Output Summary (Last 24 hours) at 11/12/2023 0807 Last data filed at 11/12/2023 0600 Gross per 24 hour  Intake 2248.74 ml  Output 1575 ml  Net 673.74 ml      11/11/2023    6:52 AM 05/24/2023   11:01 AM 12/10/2021    8:25 AM  Last 3 Weights  Weight (lbs) 206 lb 12.7 oz 198 lb 6.4 oz 196 lb 12.8 oz  Weight (kg) 93.8 kg 89.994 kg 89.268 kg      Telemetry    Personally Reviewed  ECG    Personally Reviewed  Physical Exam   GEN: No acute distress.   Cardiac: RRR, no murmurs, rubs, or gallops.  Respiratory: Clear to auscultation bilaterally. Psych: Normal affect   Assessment & Plan    #NSTEMI Hemodynamically and electrically stable.  Chest pain-free. EF normal yesterday. Continue aspirin  81 mg  Continue heparin  drip Continue metoprolol    #CKD 3B #History of end-stage renal disease post kidney transplant 2003 We discussed the risks of left heart catheterization.  He understands that there is some risk with receiving contrast but will try to minimize volume of contrast and stage procedures as indicated/needed.   #Diabetes #Hypothyroidism #Anxiety/depression Management per medicine team   Keep NPO after MN.   Ole T. Cindie, MD, [facility], [facility] Cardiac Electrophysiology

## 2023-11-13 ENCOUNTER — Encounter (HOSPITAL_COMMUNITY)
Admission: EM | Disposition: A | Discharge status: Home / Self Care | Payer: Self-pay | Source: Home / Self Care | Attending: Internal Medicine

## 2023-11-13 ENCOUNTER — Encounter (HOSPITAL_COMMUNITY): Payer: Self-pay | Admitting: Cardiology

## 2023-11-13 DIAGNOSIS — I214 Non-ST elevation (NSTEMI) myocardial infarction: Secondary | ICD-10-CM | POA: Diagnosis not present

## 2023-11-13 DIAGNOSIS — E7800 Pure hypercholesterolemia, unspecified: Secondary | ICD-10-CM

## 2023-11-13 DIAGNOSIS — I2510 Atherosclerotic heart disease of native coronary artery without angina pectoris: Secondary | ICD-10-CM

## 2023-11-13 HISTORY — PX: CORONARY ANGIOGRAPHY: CATH118303

## 2023-11-13 LAB — CBC
HCT: 29.6 % — ABNORMAL LOW (ref 39.0–52.0)
HCT: 30.3 % — ABNORMAL LOW (ref 39.0–52.0)
Hemoglobin: 9.7 g/dL — ABNORMAL LOW (ref 13.0–17.0)
Hemoglobin: 10 g/dL — ABNORMAL LOW (ref 13.0–17.0)
MCH: 31 pg (ref 26.0–34.0)
MCH: 31.5 pg (ref 26.0–34.0)
MCHC: 32.8 g/dL (ref 30.0–36.0)
MCHC: 33 g/dL (ref 30.0–36.0)
MCV: 94.6 fL (ref 80.0–100.0)
MCV: 95.6 fL (ref 80.0–100.0)
Platelets: 297 K/uL (ref 150–400)
Platelets: 286 K/uL (ref 150–400)
RBC: 3.13 MIL/uL — ABNORMAL LOW (ref 4.22–5.81)
RBC: 3.17 MIL/uL — ABNORMAL LOW (ref 4.22–5.81)
RDW: 12.8 % (ref 11.5–15.5)
RDW: 13 % (ref 11.5–15.5)
WBC: 7.1 K/uL (ref 4.0–10.5)
WBC: 6.6 K/uL (ref 4.0–10.5)
nRBC: 0 % (ref 0.0–0.2)
nRBC: 0 % (ref 0.0–0.2)

## 2023-11-13 LAB — BASIC METABOLIC PANEL WITH GFR
Anion gap: 6 (ref 5–15)
BUN: 45 mg/dL — ABNORMAL HIGH (ref 6–20)
CO2: 18 mmol/L — ABNORMAL LOW (ref 22–32)
Calcium: 8.8 mg/dL — ABNORMAL LOW (ref 8.9–10.3)
Chloride: 116 mmol/L — ABNORMAL HIGH (ref 98–111)
Creatinine, Ser: 1.72 mg/dL — ABNORMAL HIGH (ref 0.61–1.24)
GFR, Estimated: 48 mL/min — ABNORMAL LOW (ref >60)
Glucose, Bld: 135 mg/dL — ABNORMAL HIGH (ref 70–99)
Potassium: 4.4 mmol/L (ref 3.5–5.1)
Sodium: 140 mmol/L (ref 135–145)

## 2023-11-13 LAB — MAGNESIUM: Magnesium: 2.2 mg/dL (ref 1.7–2.4)

## 2023-11-13 LAB — GLUCOSE, CAPILLARY
Glucose-Capillary: 111 mg/dL — ABNORMAL HIGH (ref 70–99)
Glucose-Capillary: 139 mg/dL — ABNORMAL HIGH (ref 70–99)
Glucose-Capillary: 187 mg/dL — ABNORMAL HIGH (ref 70–99)
Glucose-Capillary: 182 mg/dL — ABNORMAL HIGH (ref 70–99)
Glucose-Capillary: 209 mg/dL — ABNORMAL HIGH (ref 70–99)

## 2023-11-13 LAB — HEPARIN LEVEL (UNFRACTIONATED): Heparin Unfractionated: 0.36 [IU]/mL (ref 0.30–0.70)

## 2023-11-13 LAB — CREATININE, SERUM
Creatinine, Ser: 1.68 mg/dL — ABNORMAL HIGH (ref 0.61–1.24)
GFR, Estimated: 50 mL/min — ABNORMAL LOW (ref >60)

## 2023-11-13 LAB — TACROLIMUS LEVEL: Tacrolimus (FK506) - LabCorp: 4.8 ng/mL — ABNORMAL LOW (ref 5.0–20.0)

## 2023-11-13 SURGERY — CORONARY ANGIOGRAPHY (CATH LAB)
Anesthesia: LOCAL

## 2023-11-13 MED ORDER — SODIUM CHLORIDE 0.9% FLUSH: 3.0000 mL | Freq: Two times a day (BID) | INTRAVENOUS | Status: DC

## 2023-11-13 MED ORDER — ESOMEPRAZOLE MAGNESIUM 40 MG PO CPDR
40.0000 mg | DELAYED_RELEASE_CAPSULE | Freq: Two times a day (BID) | ORAL | Status: DC
  Administered 2023-11-13 – 2023-11-14 (×3): 40 mg via ORAL
  Filled 2023-11-13 (×7): qty 1

## 2023-11-13 MED ORDER — HEPARIN SODIUM (PORCINE) 1000 UNIT/ML IJ SOLN
INTRAMUSCULAR | Status: DC | PRN
Start: 2023-11-13 — End: 2023-11-13
  Administered 2023-11-13: 4500 [IU] via INTRAVENOUS

## 2023-11-13 MED ORDER — LABETALOL HCL 5 MG/ML IV SOLN: 10.0000 mg | INTRAVENOUS | Status: AC | PRN

## 2023-11-13 MED ORDER — LORAZEPAM 0.5 MG PO TABS
0.5000 mg | ORAL_TABLET | Freq: Four times a day (QID) | ORAL | Status: DC | PRN
  Administered 2023-11-13: 0.5 mg via ORAL
  Filled 2023-11-13: qty 1

## 2023-11-13 MED ORDER — HEPARIN (PORCINE) IN NACL 1000-0.9 UT/500ML-% IV SOLN
INTRAVENOUS | Status: DC | PRN
  Administered 2023-11-13 (×2): 500 mL

## 2023-11-13 MED ORDER — SODIUM CHLORIDE 0.9 % IV SOLN: 250.0000 mL | INTRAVENOUS | Status: DC | PRN

## 2023-11-13 MED ORDER — MIDAZOLAM HCL 2 MG/2ML IJ SOLN
INTRAMUSCULAR | Status: AC
  Filled 2023-11-13: qty 2

## 2023-11-13 MED ORDER — LIDOCAINE HCL (PF) 1 % IJ SOLN
INTRAMUSCULAR | Status: AC
  Filled 2023-11-13: qty 30

## 2023-11-13 MED ORDER — METOPROLOL SUCCINATE ER 50 MG PO TB24
50.0000 mg | ORAL_TABLET | Freq: Every day | ORAL | Status: DC
  Administered 2023-11-13 – 2023-11-14 (×2): 50 mg via ORAL
  Filled 2023-11-13 (×2): qty 1

## 2023-11-13 MED ORDER — SODIUM CHLORIDE 0.9 % IV SOLN
INTRAVENOUS | Status: DC | PRN
  Administered 2023-11-13: 10 mL/h via INTRAVENOUS

## 2023-11-13 MED ORDER — LACTATED RINGERS IV SOLN: INTRAVENOUS | Status: DC

## 2023-11-13 MED ORDER — FENTANYL CITRATE (PF) 100 MCG/2ML IJ SOLN
INTRAMUSCULAR | Status: AC
  Filled 2023-11-13: qty 2

## 2023-11-13 MED ORDER — VERAPAMIL HCL 2.5 MG/ML IV SOLN
INTRAVENOUS | Status: AC
  Filled 2023-11-13: qty 2

## 2023-11-13 MED ORDER — SODIUM CHLORIDE 0.9% FLUSH
3.0000 mL | INTRAVENOUS | Status: DC | PRN
Start: 2023-11-13 — End: 2023-11-14

## 2023-11-13 MED ORDER — IOHEXOL 350 MG/ML SOLN
INTRAVENOUS | Status: DC | PRN
  Administered 2023-11-13: 30 mL

## 2023-11-13 MED ORDER — SODIUM CHLORIDE 0.9 % IV SOLN: INTRAVENOUS | Status: AC

## 2023-11-13 MED ORDER — MIDAZOLAM HCL 2 MG/2ML IJ SOLN
INTRAMUSCULAR | Status: DC | PRN
  Administered 2023-11-13: 1 mg via INTRAVENOUS
  Administered 2023-11-13: 2 mg via INTRAVENOUS

## 2023-11-13 MED ORDER — SODIUM CHLORIDE 0.9 % IV SOLN: INTRAVENOUS | Status: DC

## 2023-11-13 MED ORDER — VERAPAMIL HCL 2.5 MG/ML IV SOLN
INTRAVENOUS | Status: DC | PRN
  Administered 2023-11-13: 10 mL via INTRA_ARTERIAL

## 2023-11-13 MED ORDER — HEPARIN SODIUM (PORCINE) 1000 UNIT/ML IJ SOLN
INTRAMUSCULAR | Status: AC
  Filled 2023-11-13: qty 10

## 2023-11-13 MED ORDER — FENTANYL CITRATE (PF) 100 MCG/2ML IJ SOLN
INTRAMUSCULAR | Status: DC | PRN
  Administered 2023-11-13 (×2): 25 ug via INTRAVENOUS

## 2023-11-13 MED ORDER — HYDRALAZINE HCL 20 MG/ML IJ SOLN: 10.0000 mg | INTRAMUSCULAR | Status: AC | PRN

## 2023-11-13 MED ORDER — ENOXAPARIN SODIUM 40 MG/0.4ML IJ SOSY
40.0000 mg | PREFILLED_SYRINGE | INTRAMUSCULAR | Status: DC
  Administered 2023-11-14: 40 mg via SUBCUTANEOUS
  Filled 2023-11-13: qty 0.4

## 2023-11-13 MED ORDER — ISOSORBIDE MONONITRATE ER 30 MG PO TB24
30.0000 mg | ORAL_TABLET | Freq: Every day | ORAL | Status: DC
  Administered 2023-11-13: 30 mg via ORAL
  Filled 2023-11-13: qty 1

## 2023-11-13 MED ORDER — HYDRALAZINE HCL 20 MG/ML IJ SOLN
INTRAMUSCULAR | Status: DC | PRN
  Administered 2023-11-13: 10 mg via INTRAVENOUS

## 2023-11-13 MED ORDER — FAMOTIDINE IN NACL 20-0.9 MG/50ML-% IV SOLN
20.0000 mg | Freq: Once | INTRAVENOUS | Status: AC
  Administered 2023-11-13: 20 mg via INTRAVENOUS
  Filled 2023-11-13: qty 50

## 2023-11-13 MED ORDER — PANTOPRAZOLE SODIUM 40 MG PO TBEC: 40.0000 mg | DELAYED_RELEASE_TABLET | Freq: Two times a day (BID) | ORAL | Status: DC

## 2023-11-13 MED ORDER — SODIUM CHLORIDE 0.9 % IV SOLN
25.0000 mg | Freq: Four times a day (QID) | INTRAVENOUS | Status: DC | PRN
  Administered 2023-11-13: 25 mg via INTRAVENOUS
  Filled 2023-11-13: qty 1

## 2023-11-13 MED ORDER — LIDOCAINE HCL (PF) 1 % IJ SOLN
INTRAMUSCULAR | Status: DC | PRN
  Administered 2023-11-13: 2 mL via INTRADERMAL

## 2023-11-13 SURGICAL SUPPLY — 8 items
CATH 5FR JL3.5 JR4 ANG PIG MP (CATHETERS) IMPLANT
DEVICE RAD COMP TR BAND LRG (VASCULAR PRODUCTS) IMPLANT
GLIDESHEATH SLEND SS 6F .021 (SHEATH) IMPLANT
GUIDEWIRE INQWIRE 1.5J.035X260 (WIRE) IMPLANT
PACK CARDIAC CATHETERIZATION (CUSTOM PROCEDURE TRAY) ×1 IMPLANT
SET ATX-X65L (MISCELLANEOUS) IMPLANT
SHEATH PROBE COVER 6X72 (BAG) IMPLANT
WIRE HI TORQ VERSACORE-J 145CM (WIRE) IMPLANT

## 2023-11-13 NOTE — Progress Notes (Signed)
 Pt refused Lasix  at this time. Pt stated his SOB is now relieved. Denied chest tightness or chest pain. BP 136/ 69 mmHg after Hydralazine  was given. HR 70s, NSR on the monitor. No obvious acute distress. Plan of care was reviewed. Plan for cardiac catheterization tomorrow. Pt expressed his understanding for the procedure. No questions at this time. We will continue to monitor.  Wendi Dash, RN

## 2023-11-13 NOTE — Progress Notes (Signed)
 Patient Name: Daniel Hill Date of Encounter: 11/13/2023 [facility] Cardiologist: None  11/10/2023 .admit Length of stay: 2  Interval Summary  .    Daniel Hill is a 48 y.o. 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  Patient has severe GERD, only responds to Nexium .  He also takes Pepcid  sublingual with relief.  He has not had any further chest pain that he presented with to the ED since being in the hospital.  He is anxious about cardiac catheterization.  Physical Exam    Vitals:   11/13/23 0400 11/13/23 0600 11/13/23 0722 11/13/23 0724  BP: (!) 160/86 (!) 167/83 (!) 172/83 (!) 162/83  Pulse: 77 77    Resp: (!) 21 (!) 21 19 (!) 0  Temp:   98 F (36.7 C)   TempSrc:   Oral   SpO2: 95% 93% 95% 92%  Weight: 95.3 kg     Height:       Physical Exam Neck:     Vascular: No carotid bruit or JVD.  Cardiovascular:     Rate and Rhythm: Normal rate and regular rhythm.     Heart sounds: Normal heart sounds. No murmur heard.    No gallop.  Pulmonary:     Effort: Pulmonary effort is normal.     Breath sounds: Normal breath sounds.  Abdominal:     General: Bowel sounds are normal.     Palpations: Abdomen is soft.  Musculoskeletal:     Right lower leg: No edema.     Left lower leg: No edema.        11/13/2023    4:00 AM 11/12/2023    7:18 PM 11/11/2023    6:52 AM  Last 3 Weights  Weight (lbs) 210 lb 210 lb 206 lb 12.7 oz  Weight (kg) 95.255 kg 95.255 kg 93.8 kg      Labs   Lab Results  Component Value Date   NA 140 11/13/2023   K 4.4 11/13/2023   CO2 18 (L) 11/13/2023   GLUCOSE 135 (H) 11/13/2023   BUN 45 (H)  11/13/2023   CREATININE 1.72 (H) 11/13/2023   CALCIUM  8.8 (L) 11/13/2023   GFRNONAA 48 (L) 11/13/2023       Latest Ref Rng & Units 11/13/2023    2:44 AM 11/12/2023    9:11 AM 11/11/2023   12:07 AM  BMP  Glucose 70 - 99 mg/dL 864  840    837  875   BUN 6 - 20 mg/dL 45  42    42  50   Creatinine 0.61 - 1.24 mg/dL 8.27  8.40    8.37  8.05   Sodium 135 - 145 mmol/L 140  139    140  139   Potassium 3.5 - 5.1 mmol/L 4.4  4.2    4.3  5.2   Chloride 98 - 111 mmol/L 116  116    117  114   CO2 22 - 32 mmol/L 18  14    15  17    Calcium  8.9 - 10.3 mg/dL 8.8  8.1    8.1  9.3        Latest Ref Rng & Units 11/13/2023    2:44 AM 11/12/2023  9:11 AM 11/11/2023   12:07 AM  CBC  WBC 4.0 - 10.5 K/uL 7.1  7.5  11.9   Hemoglobin 13.0 - 17.0 g/dL 9.7  9.5  89.2   Hematocrit 39.0 - 52.0 % 29.6  30.0  33.3   Platelets 150 - 400 K/uL 297  256  308     Lab Results  Component Value Date   CHOL 165 11/11/2023   HDL 54 11/11/2023   LDLCALC 104 (H) 11/11/2023   TRIG 35 11/11/2023   CHOLHDL 3.1 11/11/2023    Lab Results  Component Value Date   TSH 3.660 07/21/2020    Lab Results  Component Value Date   HGBA1C 6.4 (H) 11/11/2023    Cardiac Panel (last 3 results) Recent Labs    11/11/23 1139 11/11/23 1317 11/11/23 1605  TROPONINIHS 601* 593* 564*    BNP (last 3 results) Recent Labs    11/11/23 0007  BNP 113.2*     Intake/Output Summary (Last 24 hours) at 11/13/2023 0926 Last data filed at 11/13/2023 0600 Gross per 24 hour  Intake 2378.3 ml  Output 1425 ml  Net 953.3 ml    Net IO Since Admission: 942.67 mL [11/13/23 0926]  Tele/EKG/Cardiac studies    Telemetry: Personally reviewed, no significant arrhythmias.  EKG:  11/13/2023: Normal sinus rhythm, minimal nonspecific ST depressions in anterior leads unchanged from prior EKG.  ECHOCARDIOGRAM COMPLETE 11/11/2023  1. Left ventricular ejection fraction, by estimation, is 60 to 65%. The left ventricle has normal  function. The left ventricle has no regional wall motion abnormalities. Left ventricular diastolic parameters were normal. 2. Right ventricular systolic function is normal. The right ventricular size is normal. Tricuspid regurgitation signal is inadequate for assessing PA pressure. 3. The mitral valve is normal in structure. Mild mitral valve regurgitation. No evidence of mitral stenosis. 4. The aortic valve is normal in structure. Aortic valve regurgitation is not visualized. No aortic stenosis is present. 5. The inferior vena cava is normal in size with greater than 50% respiratory variability, suggesting right atrial pressure of 3 mmHg.    Radiology   Imaging results have been reviewed   Current Meds:     Current Facility-Administered Medications:    0.9 %  sodium chloride  infusion, , Intravenous, Continuous, Amin, Ankit C, MD, Last Rate: 100 mL/hr at 11/13/23 0825, New Bag at 11/13/23 0825   [EXPIRED] 0.9% sodium chloride  infusion, 3 mL/kg/hr, Intravenous, Continuous **FOLLOWED BY** 0.9% sodium chloride  infusion, 1 mL/kg/hr, Intravenous, Continuous, Zhao, Xika, NP, Last Rate: 93.8 mL/hr at 11/13/23 0411, 1 mL/kg/hr at 11/13/23 0411   acetaminophen  (TYLENOL ) tablet 650 mg, 650 mg, Oral, Q6H PRN **OR** acetaminophen  (TYLENOL ) suppository 650 mg, 650 mg, Rectal, Q6H PRN, Sundil, Subrina, MD   aspirin  EC tablet 81 mg, 81 mg, Oral, Daily, Sundil, Subrina, MD, 81 mg at 11/12/23 1012   atorvastatin  (LIPITOR) tablet 40 mg, 40 mg, Oral, Daily, Sundil, Subrina, MD, 40 mg at 11/12/23 1012   busPIRone  (BUSPAR ) tablet 10 mg, 10 mg, Oral, BID, Sundil, Subrina, MD, 10 mg at 11/12/23 2002   Chlorhexidine  Gluconate Cloth 2 % PADS 6 each, 6 each, Topical, Daily, Amin, Ankit C, MD, 6 each at 11/11/23 1209   famotidine  (PEPCID ) IVPB 20 mg premix, 20 mg, Intravenous, Once, Ladona Heinz, MD   furosemide  (LASIX ) injection 20 mg, 20 mg, Intravenous, Once, Sundil, Subrina, MD   guaiFENesin  (ROBITUSSIN) 100 MG/5ML  liquid 5 mL, 5 mL, Oral, Q4H PRN, Amin, Ankit C, MD   heparin  ADULT infusion  100 units/mL (25000 units/250mL), 1,500 Units/hr, Intravenous, Continuous, Billy Rocky SAUNDERS, RPH, Last Rate: 15 mL/hr at 11/12/23 2300, 1,500 Units/hr at 11/12/23 2300   hydrALAZINE  (APRESOLINE ) injection 10 mg, 10 mg, Intravenous, Q4H PRN, Amin, Ankit C, MD, 10 mg at 11/12/23 2000   insulin  aspart (novoLOG ) injection 5 Units, 5 Units, Intravenous, Once **AND** [DISCONTINUED] dextrose  50 % solution 50 mL, 1 ampule, Intravenous, Once **AND** [COMPLETED] POCT CBG monitoring, , , Once, Sundil, Subrina, MD   insulin  pump, , Subcutaneous, Q4H, Sundil, Subrina, MD, 2 each at 11/12/23 1800   ipratropium-albuterol  (DUONEB) 0.5-2.5 (3) MG/3ML nebulizer solution 3 mL, 3 mL, Nebulization, Q4H PRN, Amin, Ankit C, MD   levothyroxine  (SYNTHROID ) tablet 75 mcg, 75 mcg, Oral, Q0600, Sundil, Subrina, MD, 75 mcg at 11/13/23 0606   LORazepam  (ATIVAN ) tablet 0.5 mg, 0.5 mg, Oral, Q6H PRN, Sundil, Subrina, MD, 0.5 mg at 11/13/23 0410   metoprolol  tartrate (LOPRESSOR ) injection 5 mg, 5 mg, Intravenous, Q4H PRN, Amin, Ankit C, MD   metoprolol  tartrate (LOPRESSOR ) tablet 12.5 mg, 12.5 mg, Oral, BID, Cindie Smalls T, MD, 12.5 mg at 11/12/23 2001   nitroGLYCERIN  (NITROSTAT ) SL tablet 0.4 mg, 0.4 mg, Sublingual, Q5 min PRN, Sundil, Subrina, MD   ondansetron  (ZOFRAN ) tablet 4 mg, 4 mg, Oral, Q6H PRN, 4 mg at 11/13/23 0409 **OR** ondansetron  (ZOFRAN ) injection 4 mg, 4 mg, Intravenous, Q6H PRN, Sundil, Subrina, MD   Oral care mouth rinse, 15 mL, Mouth Rinse, PRN, Amin, Ankit C, MD   pantoprazole  (PROTONIX ) EC tablet 40 mg, 40 mg, Oral, BID AC, Amin, Ankit C, MD   senna-docusate (Senokot-S) tablet 1 tablet, 1 tablet, Oral, QHS PRN, Amin, Ankit C, MD   sodium bicarbonate  tablet 650 mg, 650 mg, Oral, BID, Amin, Ankit C, MD, 650 mg at 11/12/23 2002   sodium chloride  flush (NS) 0.9 % injection 3 mL, 3 mL, Intravenous, Q12H, Sundil, Subrina, MD, 3 mL at  11/12/23 1021   sodium chloride  flush (NS) 0.9 % injection 3 mL, 3 mL, Intravenous, Q12H, Sundil, Subrina, MD, 3 mL at 11/12/23 2002   sodium chloride  flush (NS) 0.9 % injection 3 mL, 3 mL, Intravenous, PRN, Sundil, Subrina, MD   tacrolimus  (PROGRAF ) capsule 1 mg, 1 mg, Oral, QHS, Sundil, Subrina, MD, 1 mg at 11/12/23 2001   tacrolimus  (PROGRAF ) capsule 2 mg, 2 mg, Oral, Daily, Sundil, Subrina, MD, 2 mg at 11/12/23 1012   traZODone  (DESYREL ) tablet 50 mg, 50 mg, Oral, QHS PRN, Amin, Ankit C, MD, 50 mg at 11/12/23 2002  Assessment & Plan .     1.  NSTEMI 2.  Chronic stage III kidney disease secondary to diabetes mellitus type 1 3.  Renal transplant patient 4.  GERD 5.  Hypercholesterolemia  Recommendations: Patient fortunately has not had any significant chest discomfort since hospital admission.  He is prepared for cardiac catheterization.  All questions answered.  Will need to watch the video for the cardiac catheterization.  Patient is aware of the risk of nephrotoxicity.  Patient is extremely high risk for multivessel disease as well and appropriate to proceed with cardiac catheterization.  He is presently getting fluids at 3 mg/kg in view of renal insufficiency and will keep contrast exposure to the minimal.  Will avoid any nephrotoxic agents for now including initiation of ACE inhibitors or ARB.  He has severe GERD, I given him 1 dose of IV Pepcid .  He responds to Nexium  and patient's family will be bringing this to the hospital for pharmacy to dispense.  With regard hypercholesterolemia, statin dose has been increased to Lipitor 40 mg daily, LDL goal <55.  Have a low threshold for initiation of PCSK9 inhibitors especially if he has significant CAD.  Will continue to follow.  For questions or updates, please contact [facility] Please consult [email or web address] for contact info under       Signed,   Gordy Bergamo, MD, [facility] 11/13/2023, 9:26 AM [facility] [address] [place], [place] [address] Phone: [phone]. Fax:  [phone]

## 2023-11-13 NOTE — Progress Notes (Signed)
 PROGRESS NOTE    Daniel Hill  FMW:[id number] DOB: [birth year] DOA: 11/10/2023 PCP: Thurmond Cathlyn LABOR., MD    Brief Narrative:  48 year old with history of renal and pancreatic transplant 2003, diabetic retinopathy, diabetic gastroparesis, Hashimoto's thyroiditis, hypogonadotrophic hypogonadism, GAD, depression, and DM1 presented to the ED with complaining of left-sided chest pain.  Initially given aspirin .  Initial troponins trended up to 07, chest x-ray showed minimal ST depression in lead II otherwise normal sinus rhythm.  EDP consulted cardiology recommending heparin  drip.  Echocardiogram shows preserved EF, A1c 6.4, LDL 104.  Cardiology planning on left heart catheterization on Monday   Assessment & Plan:  Principal Problem:   NSTEMI (non-ST elevated myocardial infarction) [facility]) Active Problems:   Hyperkalemia   History of renal transplant   Hypothyroidism due to Hashimoto's thyroiditis   Hypogonadotropic hypogonadism (HCC)   Chest pain   AKI (acute kidney injury) (HCC)   History of pancreas transplant (HCC)   Insulin  dependent type 1 diabetes mellitus (HCC)   GAD (generalized anxiety disorder)   GERD (gastroesophageal reflux disease)   Pulmonary edema   Elevated blood pressure reading    NSTEMI Chest pain - Troponins continue to rise, remains on heparin  drip.  EKG is nonischemic.  Plans for [facility] on today - A1c 6.4, LDL 104 - Echocardiogram preserved EF 65% -Aspirin  325 mg given, continue 81 mg daily   Hypertension, uncontrolled Echocardiogram in 2018 showed preserved EF Will defer to cardiology so appropriate medications can be added from a cardiac standpoint.   Hyperkalemia -Elevated potassium 5.2.  rsolved   CKD stage IIIb Metabolic acidosis Baseline creatinine 1.8, admission creatinine 1.94 - IV fluids.  Will hydrate as best as possible in anticipation for contrast for left heart cath. -Will order sodium bicarb     History of renal transplant History of  pancreas transplant - Checking Tac level - in process and continue Prograf .   History of insulin -dependent DM type I -Sliding scale and Accu-Cheks.  Reportedly on insulin  pump   Generalized anxiety disorder -Continue BuSpar    GERD -Continue Protonix    History of Hashimoto thyroiditis with hypothyroidism - Continue levothyroxine  75 mcg daily   Green-colored urine - Patient reported he takes methylene blue to support his health at cellular level and methylene blue can cause green color urine.   DVT prophylaxis:  IV heparin  gtts Code Status:  Full Code Diet: Cardiac Family Communication: Wife at bedside Disposition Plan: Plans for [facility] on Monday  Subjective: Seen at bedside no complaints. Understandably worried about his renal function  Examination:  General exam: Appears calm and comfortable  Respiratory system: Clear to auscultation. Respiratory effort normal. Cardiovascular system: S1 & S2 heard, RRR. No JVD, murmurs, rubs, gallops or clicks. No pedal edema. Gastrointestinal system: Abdomen is nondistended, soft and nontender. No organomegaly or masses felt. Normal bowel sounds heard. Central nervous system: Alert and oriented. No focal neurological deficits. Extremities: Symmetric 5 x 5 power. Skin: No rashes, lesions or ulcers Psychiatry: Judgement and insight appear normal. Mood & affect appropriate.                Diet Orders (From admission, onward)     Start     Ordered   11/13/23 0001  Diet NPO time specified Except for: Sips with Meds  Diet effective midnight       Comments: NPO for solid foods after midnight, may have clear liquids until 5am, then NPO (this would be for inpatients and outpatients)  Question:  Except for  Answer:  Sips with Meds   11/12/23 1917            Objective: Vitals:   11/13/23 0722 11/13/23 0724 11/13/23 0952 11/13/23 1048  BP: (!) 172/83 (!) 162/83 (!) 167/81 139/85  Pulse:   84   Resp: 19 (!) 0  18  Temp: 98 F  (36.7 C)   98 F (36.7 C)  TempSrc: Oral   Oral  SpO2: 95% 92%  94%  Weight:      Height:        Intake/Output Summary (Last 24 hours) at 11/13/2023 1116 Last data filed at 11/13/2023 0701 Gross per 24 hour  Intake 2780.34 ml  Output 1425 ml  Net 1355.34 ml   Filed Weights   11/11/23 0652 11/12/23 1918 11/13/23 0400  Weight: 93.8 kg 95.3 kg 95.3 kg    Scheduled Meds:  aspirin  EC  81 mg Oral Daily   atorvastatin   40 mg Oral Daily   busPIRone   10 mg Oral BID   Chlorhexidine  Gluconate Cloth  6 each Topical Daily   esomeprazole   40 mg Oral BID AC   furosemide   20 mg Intravenous Once   insulin  pump   Subcutaneous Q4H   levothyroxine   75 mcg Oral Q0600   metoprolol  tartrate  12.5 mg Oral BID   sodium bicarbonate   650 mg Oral BID   sodium chloride  flush  3 mL Intravenous Q12H   sodium chloride  flush  3 mL Intravenous Q12H   tacrolimus   1 mg Oral QHS   tacrolimus   2 mg Oral Daily   Continuous Infusions:  sodium chloride  100 mL/hr at 11/13/23 0825   sodium chloride  1 mL/kg/hr (11/13/23 0701)   famotidine  (PEPCID ) IV 20 mg (11/13/23 1114)   heparin  1,500 Units/hr (11/13/23 0701)    Nutritional status     Body mass index is 31.01 kg/m.  Data Reviewed:   CBC: Recent Labs  Lab 11/11/23 0007 11/12/23 0911 11/13/23 0244  WBC 11.9* 7.5 7.1  NEUTROABS 9.1*  --   --   HGB 10.7* 9.5* 9.7*  HCT 33.3* 30.0* 29.6*  MCV 97.7 99.3 94.6  PLT 308 256 297   Basic Metabolic Panel: Recent Labs  Lab 11/11/23 0007 11/12/23 0911 11/13/23 0244  NA 139 139  140 140  K 5.2* 4.2  4.3 4.4  CL 114* 116*  117* 116*  CO2 17* 14*  15* 18*  GLUCOSE 124* 159*  162* 135*  BUN 50* 42*  42* 45*  CREATININE 1.94* 1.59*  1.62* 1.72*  CALCIUM  9.3 8.1*  8.1* 8.8*  MG  --  2.0 2.2  PHOS  --  2.5  --    GFR: Estimated Creatinine Clearance: 59.8 mL/min (A) (by C-G formula based on SCr of 1.72 mg/dL (H)). Liver Function Tests: Recent Labs  Lab 11/12/23 0911  AST 18  ALT 20   ALKPHOS 74  BILITOT 0.4  PROT 5.5*  ALBUMIN 2.6*   No results for input(s): LIPASE, AMYLASE in the last 168 hours. No results for input(s): AMMONIA in the last 168 hours. Coagulation Profile: No results for input(s): INR, PROTIME in the last 168 hours. Cardiac Enzymes: No results for input(s): CKTOTAL, CKMB, CKMBINDEX, TROPONINI in the last 168 hours. BNP (last 3 results) No results for input(s): PROBNP in the last 8760 hours. HbA1C: Recent Labs    11/11/23 0524  HGBA1C 6.4*   CBG: Recent Labs  Lab 11/12/23 1643 11/12/23 2008 11/12/23 2349 11/13/23 0409 11/13/23 0721  GLUCAP  136* 243* 167* 111* 139*   Lipid Profile: Recent Labs    11/11/23 0524  CHOL 165  HDL 54  LDLCALC 104*  TRIG 35  CHOLHDL 3.1   Thyroid  Function Tests: No results for input(s): TSH, T4TOTAL, FREET4, T3FREE, THYROIDAB in the last 72 hours. Anemia Panel: No results for input(s): VITAMINB12, FOLATE, FERRITIN, TIBC, IRON, RETICCTPCT in the last 72 hours. Sepsis Labs: No results for input(s): PROCALCITON, LATICACIDVEN in the last 168 hours.  Recent Results (from the past 240 hours)  MRSA Next Gen by PCR, Nasal     Status: None   Collection Time: 11/11/23  6:56 AM   Specimen: Nasal Mucosa; Nasal Swab  Result Value Ref Range Status   MRSA by PCR Next Gen NOT DETECTED NOT DETECTED Final    Comment: (NOTE) The GeneXpert MRSA Assay (FDA approved for NASAL specimens only), is one component of a comprehensive MRSA colonization surveillance program. It is not intended to diagnose MRSA infection nor to guide or monitor treatment for MRSA infections. Test performance is not FDA approved in patients less than 31 years old. Performed at [facility], [address] [place], [place] [address]   Surgical pcr screen     Status: None   Collection Time: 11/12/23  2:08 AM   Specimen: Nasal Mucosa; Nasal Swab  Result Value Ref Range Status   MRSA, PCR  NEGATIVE NEGATIVE Final   Staphylococcus aureus NEGATIVE NEGATIVE Final    Comment: (NOTE) The Xpert SA Assay (FDA approved for NASAL specimens in patients 66 years of age and older), is one component of a comprehensive surveillance program. It is not intended to diagnose infection nor to guide or monitor treatment. Performed at [facility], [address] [place], [place] [address]          Radiology Studies: No results found.         LOS: 2 days   Time spent= 35 mins    Burgess JAYSON Dare, MD [facility]  If 7PM-7AM, please contact night-coverage  11/13/2023, 11:16 AM

## 2023-11-13 NOTE — Plan of Care (Signed)
  Problem: Coping: Goal: Ability to adjust to condition or change in health will improve Outcome: Progressing   Problem: Fluid Volume: Goal: Ability to maintain a balanced intake and output will improve Outcome: Progressing   Problem: Health Behavior/Discharge Planning: Goal: Ability to identify and utilize available resources and services will improve Outcome: Progressing Goal: Ability to manage health-related needs will improve Outcome: Progressing   Problem: Metabolic: Goal: Ability to maintain appropriate glucose levels will improve Outcome: Progressing   Problem: Tissue Perfusion: Goal: Adequacy of tissue perfusion will improve Outcome: Progressing   Problem: Health Behavior/Discharge Planning: Goal: Ability to manage health-related needs will improve Outcome: Progressing

## 2023-11-13 NOTE — H&P (View-Only) (Signed)
 Patient Name: Daniel Hill Date of Encounter: 11/13/2023 [facility] Cardiologist: None  11/10/2023 .admit Length of stay: 2  Interval Summary  .    Daniel Hill is a 48 y.o. 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  Patient has severe GERD, only responds to Nexium .  He also takes Pepcid  sublingual with relief.  He has not had any further chest pain that he presented with to the ED since being in the hospital.  He is anxious about cardiac catheterization.  Physical Exam    Vitals:   11/13/23 0400 11/13/23 0600 11/13/23 0722 11/13/23 0724  BP: (!) 160/86 (!) 167/83 (!) 172/83 (!) 162/83  Pulse: 77 77    Resp: (!) 21 (!) 21 19 (!) 0  Temp:   98 F (36.7 C)   TempSrc:   Oral   SpO2: 95% 93% 95% 92%  Weight: 95.3 kg     Height:       Physical Exam Neck:     Vascular: No carotid bruit or JVD.  Cardiovascular:     Rate and Rhythm: Normal rate and regular rhythm.     Heart sounds: Normal heart sounds. No murmur heard.    No gallop.  Pulmonary:     Effort: Pulmonary effort is normal.     Breath sounds: Normal breath sounds.  Abdominal:     General: Bowel sounds are normal.     Palpations: Abdomen is soft.  Musculoskeletal:     Right lower leg: No edema.     Left lower leg: No edema.        11/13/2023    4:00 AM 11/12/2023    7:18 PM 11/11/2023    6:52 AM  Last 3 Weights  Weight (lbs) 210 lb 210 lb 206 lb 12.7 oz  Weight (kg) 95.255 kg 95.255 kg 93.8 kg      Labs   Lab Results  Component Value Date   NA 140 11/13/2023   K 4.4 11/13/2023   CO2 18 (L) 11/13/2023   GLUCOSE 135 (H) 11/13/2023   BUN 45 (H)  11/13/2023   CREATININE 1.72 (H) 11/13/2023   CALCIUM  8.8 (L) 11/13/2023   GFRNONAA 48 (L) 11/13/2023       Latest Ref Rng & Units 11/13/2023    2:44 AM 11/12/2023    9:11 AM 11/11/2023   12:07 AM  BMP  Glucose 70 - 99 mg/dL 864  840    837  875   BUN 6 - 20 mg/dL 45  42    42  50   Creatinine 0.61 - 1.24 mg/dL 8.27  8.40    8.37  8.05   Sodium 135 - 145 mmol/L 140  139    140  139   Potassium 3.5 - 5.1 mmol/L 4.4  4.2    4.3  5.2   Chloride 98 - 111 mmol/L 116  116    117  114   CO2 22 - 32 mmol/L 18  14    15  17    Calcium  8.9 - 10.3 mg/dL 8.8  8.1    8.1  9.3        Latest Ref Rng & Units 11/13/2023    2:44 AM 11/12/2023  9:11 AM 11/11/2023   12:07 AM  CBC  WBC 4.0 - 10.5 K/uL 7.1  7.5  11.9   Hemoglobin 13.0 - 17.0 g/dL 9.7  9.5  89.2   Hematocrit 39.0 - 52.0 % 29.6  30.0  33.3   Platelets 150 - 400 K/uL 297  256  308     Lab Results  Component Value Date   CHOL 165 11/11/2023   HDL 54 11/11/2023   LDLCALC 104 (H) 11/11/2023   TRIG 35 11/11/2023   CHOLHDL 3.1 11/11/2023    Lab Results  Component Value Date   TSH 3.660 07/21/2020    Lab Results  Component Value Date   HGBA1C 6.4 (H) 11/11/2023    Cardiac Panel (last 3 results) Recent Labs    11/11/23 1139 11/11/23 1317 11/11/23 1605  TROPONINIHS 601* 593* 564*    BNP (last 3 results) Recent Labs    11/11/23 0007  BNP 113.2*     Intake/Output Summary (Last 24 hours) at 11/13/2023 0926 Last data filed at 11/13/2023 0600 Gross per 24 hour  Intake 2378.3 ml  Output 1425 ml  Net 953.3 ml    Net IO Since Admission: 942.67 mL [11/13/23 0926]  Tele/EKG/Cardiac studies    Telemetry: Personally reviewed, no significant arrhythmias.  EKG:  11/13/2023: Normal sinus rhythm, minimal nonspecific ST depressions in anterior leads unchanged from prior EKG.  ECHOCARDIOGRAM COMPLETE 11/11/2023  1. Left ventricular ejection fraction, by estimation, is 60 to 65%. The left ventricle has normal  function. The left ventricle has no regional wall motion abnormalities. Left ventricular diastolic parameters were normal. 2. Right ventricular systolic function is normal. The right ventricular size is normal. Tricuspid regurgitation signal is inadequate for assessing PA pressure. 3. The mitral valve is normal in structure. Mild mitral valve regurgitation. No evidence of mitral stenosis. 4. The aortic valve is normal in structure. Aortic valve regurgitation is not visualized. No aortic stenosis is present. 5. The inferior vena cava is normal in size with greater than 50% respiratory variability, suggesting right atrial pressure of 3 mmHg.    Radiology   Imaging results have been reviewed   Current Meds:     Current Facility-Administered Medications:    0.9 %  sodium chloride  infusion, , Intravenous, Continuous, Amin, Ankit C, MD, Last Rate: 100 mL/hr at 11/13/23 0825, New Bag at 11/13/23 0825   [EXPIRED] 0.9% sodium chloride  infusion, 3 mL/kg/hr, Intravenous, Continuous **FOLLOWED BY** 0.9% sodium chloride  infusion, 1 mL/kg/hr, Intravenous, Continuous, Zhao, Xika, NP, Last Rate: 93.8 mL/hr at 11/13/23 0411, 1 mL/kg/hr at 11/13/23 0411   acetaminophen  (TYLENOL ) tablet 650 mg, 650 mg, Oral, Q6H PRN **OR** acetaminophen  (TYLENOL ) suppository 650 mg, 650 mg, Rectal, Q6H PRN, Sundil, Subrina, MD   aspirin  EC tablet 81 mg, 81 mg, Oral, Daily, Sundil, Subrina, MD, 81 mg at 11/12/23 1012   atorvastatin  (LIPITOR) tablet 40 mg, 40 mg, Oral, Daily, Sundil, Subrina, MD, 40 mg at 11/12/23 1012   busPIRone  (BUSPAR ) tablet 10 mg, 10 mg, Oral, BID, Sundil, Subrina, MD, 10 mg at 11/12/23 2002   Chlorhexidine  Gluconate Cloth 2 % PADS 6 each, 6 each, Topical, Daily, Amin, Ankit C, MD, 6 each at 11/11/23 1209   famotidine  (PEPCID ) IVPB 20 mg premix, 20 mg, Intravenous, Once, Ladona Heinz, MD   furosemide  (LASIX ) injection 20 mg, 20 mg, Intravenous, Once, Sundil, Subrina, MD   guaiFENesin  (ROBITUSSIN) 100 MG/5ML  liquid 5 mL, 5 mL, Oral, Q4H PRN, Amin, Ankit C, MD   heparin  ADULT infusion  100 units/mL (25000 units/250mL), 1,500 Units/hr, Intravenous, Continuous, Billy Rocky SAUNDERS, RPH, Last Rate: 15 mL/hr at 11/12/23 2300, 1,500 Units/hr at 11/12/23 2300   hydrALAZINE  (APRESOLINE ) injection 10 mg, 10 mg, Intravenous, Q4H PRN, Amin, Ankit C, MD, 10 mg at 11/12/23 2000   insulin  aspart (novoLOG ) injection 5 Units, 5 Units, Intravenous, Once **AND** [DISCONTINUED] dextrose  50 % solution 50 mL, 1 ampule, Intravenous, Once **AND** [COMPLETED] POCT CBG monitoring, , , Once, Sundil, Subrina, MD   insulin  pump, , Subcutaneous, Q4H, Sundil, Subrina, MD, 2 each at 11/12/23 1800   ipratropium-albuterol  (DUONEB) 0.5-2.5 (3) MG/3ML nebulizer solution 3 mL, 3 mL, Nebulization, Q4H PRN, Amin, Ankit C, MD   levothyroxine  (SYNTHROID ) tablet 75 mcg, 75 mcg, Oral, Q0600, Sundil, Subrina, MD, 75 mcg at 11/13/23 0606   LORazepam  (ATIVAN ) tablet 0.5 mg, 0.5 mg, Oral, Q6H PRN, Sundil, Subrina, MD, 0.5 mg at 11/13/23 0410   metoprolol  tartrate (LOPRESSOR ) injection 5 mg, 5 mg, Intravenous, Q4H PRN, Amin, Ankit C, MD   metoprolol  tartrate (LOPRESSOR ) tablet 12.5 mg, 12.5 mg, Oral, BID, Cindie Smalls T, MD, 12.5 mg at 11/12/23 2001   nitroGLYCERIN  (NITROSTAT ) SL tablet 0.4 mg, 0.4 mg, Sublingual, Q5 min PRN, Sundil, Subrina, MD   ondansetron  (ZOFRAN ) tablet 4 mg, 4 mg, Oral, Q6H PRN, 4 mg at 11/13/23 0409 **OR** ondansetron  (ZOFRAN ) injection 4 mg, 4 mg, Intravenous, Q6H PRN, Sundil, Subrina, MD   Oral care mouth rinse, 15 mL, Mouth Rinse, PRN, Amin, Ankit C, MD   pantoprazole  (PROTONIX ) EC tablet 40 mg, 40 mg, Oral, BID AC, Amin, Ankit C, MD   senna-docusate (Senokot-S) tablet 1 tablet, 1 tablet, Oral, QHS PRN, Amin, Ankit C, MD   sodium bicarbonate  tablet 650 mg, 650 mg, Oral, BID, Amin, Ankit C, MD, 650 mg at 11/12/23 2002   sodium chloride  flush (NS) 0.9 % injection 3 mL, 3 mL, Intravenous, Q12H, Sundil, Subrina, MD, 3 mL at  11/12/23 1021   sodium chloride  flush (NS) 0.9 % injection 3 mL, 3 mL, Intravenous, Q12H, Sundil, Subrina, MD, 3 mL at 11/12/23 2002   sodium chloride  flush (NS) 0.9 % injection 3 mL, 3 mL, Intravenous, PRN, Sundil, Subrina, MD   tacrolimus  (PROGRAF ) capsule 1 mg, 1 mg, Oral, QHS, Sundil, Subrina, MD, 1 mg at 11/12/23 2001   tacrolimus  (PROGRAF ) capsule 2 mg, 2 mg, Oral, Daily, Sundil, Subrina, MD, 2 mg at 11/12/23 1012   traZODone  (DESYREL ) tablet 50 mg, 50 mg, Oral, QHS PRN, Amin, Ankit C, MD, 50 mg at 11/12/23 2002  Assessment & Plan .     1.  NSTEMI 2.  Chronic stage III kidney disease secondary to diabetes mellitus type 1 3.  Renal transplant patient 4.  GERD 5.  Hypercholesterolemia  Recommendations: Patient fortunately has not had any significant chest discomfort since hospital admission.  He is prepared for cardiac catheterization.  All questions answered.  Will need to watch the video for the cardiac catheterization.  Patient is aware of the risk of nephrotoxicity.  Patient is extremely high risk for multivessel disease as well and appropriate to proceed with cardiac catheterization.  He is presently getting fluids at 3 mg/kg in view of renal insufficiency and will keep contrast exposure to the minimal.  Will avoid any nephrotoxic agents for now including initiation of ACE inhibitors or ARB.  He has severe GERD, I given him 1 dose of IV Pepcid .  He responds to Nexium  and patient's family will be bringing this to the hospital for pharmacy to dispense.  With regard hypercholesterolemia, statin dose has been increased to Lipitor 40 mg daily, LDL goal <55.  Have a low threshold for initiation of PCSK9 inhibitors especially if he has significant CAD.  Will continue to follow.  For questions or updates, please contact [facility] Please consult [email or web address] for contact info under       Signed,   Gordy Bergamo, MD, [facility] 11/13/2023, 9:26 AM [facility] [address] [place], [place] [address] Phone: [phone]. Fax:  [phone]

## 2023-11-13 NOTE — Progress Notes (Signed)
 Pt is very anxious. He has complaints of heartburn, nausea and anxiety which is related going to the heart cath procedure this am. Emotional support given.  Protonix  PO dose schedules at 10:00 am, but med given earlier. Zofran  PRN for nausea, and Ativan  0.5 mg for anxiety given.    BP 167/83. HR 77, no chest pain, no SOB. Pt's wife at bedside has been very supportive emotionally. We will monitor and hand off to the day shift team.  Wendi Dash, RN

## 2023-11-13 NOTE — Plan of Care (Addendum)
 Patient underwent heart cath today.  He was on NS 100 cc/h for 2 days for post heart cath to prevent AKI.  As the sodium level has been trending up and given creatinine slightly trended up changing NS to LR to give 100 cc/h for next 24 hours.  Need to follow-up with renal function in the AM.  Antania Hoefling, MD [facility] 11/13/2023, 7:44 PM

## 2023-11-13 NOTE — Progress Notes (Signed)
   11/13/23 0354  Provider Notification  Provider Name/Title Dr. Lee  Date Provider Notified 11/13/23  Time Provider Notified [phone]  Method of Notification Page  Notification Reason Requested by patient/family for medication for anxiety which Pt stated that his anxiety is related to heart cath procedure coming this am.   Provider response Evaluate remotely;See new orders (Ativan  0.5 mg q 6 hrs PRN)  Date of Provider Response 11/13/23  Time of Provider Response 0357    Wendi Dash, RN

## 2023-11-13 NOTE — Interval H&P Note (Signed)
 History and Physical Interval Note:  11/13/2023 1:16 PM  Daniel Hill  has presented today for surgery, with the diagnosis of NSTEMI.  The various methods of treatment have been discussed with the patient and family. After consideration of risks, benefits and other options for treatment, the patient has consented to  Procedure(s): LEFT HEART CATH AND CORONARY ANGIOGRAPHY (N/A) as a surgical intervention.  The patient's history has been reviewed, patient examined, no change in status, stable for surgery.  I have reviewed the patient's chart and labs.  Questions were answered to the patient's satisfaction.   Cath Lab Visit (complete for each Cath Lab visit)  Clinical Evaluation Leading to the Procedure:   ACS: Yes.    Non-ACS:    Anginal Classification: CCS IV  Anti-ischemic medical therapy: No Therapy  Non-Invasive Test Results: No non-invasive testing performed  Prior CABG: No previous CABG        [name] 11/13/2023 1:16 PM

## 2023-11-13 NOTE — Inpatient Diabetes Management (Addendum)
 Inpatient Diabetes Program Recommendations  AACE/ADA: New Consensus Statement on Inpatient Glycemic Control (2015)  Target Ranges:  Prepandial:   less than 140 mg/dL      Peak postprandial:   less than 180 mg/dL (1-2 hours)      Critically ill patients:  140 - 180 mg/dL    Latest Reference Range & Units 11/11/23 23:47 11/12/23 04:29 11/12/23 07:43 11/12/23 08:50 11/12/23 11:55 11/12/23 16:43 11/12/23 20:08  Glucose-Capillary 70 - 99 mg/dL 873 (H) 97 882 (H) 828 (H) 150 (H) 136 (H) 243 (H)  (H): Data is abnormally high  Latest Reference Range & Units 11/12/23 23:49 11/13/23 04:09 11/13/23 07:21  Glucose-Capillary 70 - 99 mg/dL 832 (H) 888 (H) 860 (H)  (H): Data is abnormally high   Admit with: CP/ NSTEMI  History: Type 1 Diabetes, Renal/Pancreatic Transplant 2003, CKD  Home DM Meds: Insulin  Pump        Dexcom G6 CGM  Current Orders: Insulin  Pump  Addendum 1:15pm--Went by to check on pt today and make sure he has all insulin  pump supplies.  Pt off floor in cath lab.  Spoke with his wife and she showed me his Omni Pod pump supplies.  Wife was asking me why fingerstick CBGs (that hospital RN is checking) is about 30 points off from his Dexcom G6 sensor reading.  I explained to wife that the Dexcom reading is looking at interstitial fluid glucose versus fingerstick capillary glucose.  CBG is most current glucose value since it takes about 15-20 min for glucose to pass from the interstitial fluid to the blood stream.  Wife thanked me for the info and did not have any further questions for me at this time.    ENDO: Dr. Trixie Last Seen 05/24/2023 Insulin  Pump Settings:  Please continue: Basal rates: 12 am: 0.85 5 am: 0.75 1 pm: 1  ICR 12 am: 1:8 ISF: 12 am: 50 8 pm: 70 active insulin  time: 4h   Change targets: 12 am-9 am: 110  9 am-12 am: 130   Please do the following before every meal: Enter carbs Enter sugars Start insulin  bolus  Look up the iLet pump    --Will  follow patient during hospitalization--  Adina Rudolpho Arrow RN, MSN, CDCES Diabetes Coordinator Inpatient Glycemic Control Team Team Pager: [phone] (8a-5p)

## 2023-11-13 NOTE — Progress Notes (Addendum)
 PHARMACY - ANTICOAGULATION CONSULT NOTE  Pharmacy Consult for Heparin  Indication: chest pain/ACS  Allergies  Allergen Reactions   Tomato Nausea And Vomiting    Immediate vomiting    Other Nausea And Vomiting    RAW Canteloupe, banana, tomatoes, all raw vegetables  Unless the vegetables have dressing on them.  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   Codeine Nausea And Vomiting   Metoclopramide Hcl Nausea And Vomiting    Patient Measurements: Height: 5' 9 (175.3 cm) Weight: 95.3 kg (210 lb) IBW/kg (Calculated) : 70.7 HEPARIN  DW (KG): 90 WT ~93 kg  Vital Signs: Temp: 98 F (36.7 C) (07/21 0722) Temp Source: Oral (07/21 0722) BP: 162/83 (07/21 0724) Pulse Rate: 77 (07/21 0600)  Labs: Recent Labs    11/11/23 0007 11/11/23 0233 11/11/23 1139 11/11/23 1317 11/11/23 1426 11/11/23 1605 11/11/23 2058 11/12/23 0911 11/12/23 1345 11/13/23 0244  HGB 10.7*  --   --   --   --   --   --  9.5*  --  9.7*  HCT 33.3*  --   --   --   --   --   --  30.0*  --  29.6*  PLT 308  --   --   --   --   --   --  256  --  297  HEPARINUNFRC  --   --  >1.10*  --    < >  --    < > >1.10* 0.35 0.36  CREATININE 1.94*  --   --   --   --   --   --  1.59*  1.62*  --  1.72*  TROPONINIHS 58*   < > 601* 593*  --  564*  --   --   --   --    < > = values in this interval not displayed.    Estimated Creatinine Clearance: 59.8 mL/min (A) (by C-G formula based on SCr of 1.72 mg/dL (H)).   Medical History: Past Medical History:  Diagnosis Date   Anxiety    Depression    Diabetes mellitus    GERD (gastroesophageal reflux disease)    Hypothyroidism    Kidney replaced by transplant    Pancreas transplanted (HCC)    Thyroid  disease     Medications:  PTA meds reviewed.  Assessment: 48 y.o. M presents with CP. No AC PTA. Hgb 9s, plt wnl. Pharmacy consulted for heparin  dosing. 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 No issues with  infusion or bleeding per RN. LHC planned for today, 7/21.   Goal of Therapy:  Heparin  level 0.3-0.7 units/ml Monitor platelets by anticoagulation protocol: Yes   Plan:  Continue heparin  at 1500 units/hr Daily heparin  level and CBC F/u after cath Monday  Elma Fail, PharmD PGY1 Clinical Pharmacist [facility]

## 2023-11-14 ENCOUNTER — Other Ambulatory Visit: Payer: Self-pay | Admitting: Internal Medicine

## 2023-11-14 ENCOUNTER — Other Ambulatory Visit (HOSPITAL_COMMUNITY): Payer: Self-pay

## 2023-11-14 DIAGNOSIS — I214 Non-ST elevation (NSTEMI) myocardial infarction: Secondary | ICD-10-CM | POA: Diagnosis not present

## 2023-11-14 DIAGNOSIS — I25110 Atherosclerotic heart disease of native coronary artery with unstable angina pectoris: Secondary | ICD-10-CM

## 2023-11-14 DIAGNOSIS — E10319 Type 1 diabetes mellitus with unspecified diabetic retinopathy without macular edema: Secondary | ICD-10-CM

## 2023-11-14 LAB — MAGNESIUM: Magnesium: 1.9 mg/dL (ref 1.7–2.4)

## 2023-11-14 LAB — BASIC METABOLIC PANEL WITH GFR
Anion gap: 3 — ABNORMAL LOW (ref 5–15)
BUN: 38 mg/dL — ABNORMAL HIGH (ref 6–20)
CO2: 19 mmol/L — ABNORMAL LOW (ref 22–32)
Calcium: 9.1 mg/dL (ref 8.9–10.3)
Chloride: 116 mmol/L — ABNORMAL HIGH (ref 98–111)
Creatinine, Ser: 2.09 mg/dL — ABNORMAL HIGH (ref 0.61–1.24)
GFR, Estimated: 38 mL/min — ABNORMAL LOW (ref >60)
Glucose, Bld: 142 mg/dL — ABNORMAL HIGH (ref 70–99)
Potassium: 4.7 mmol/L (ref 3.5–5.1)
Sodium: 138 mmol/L (ref 135–145)

## 2023-11-14 LAB — GLUCOSE, CAPILLARY
Glucose-Capillary: 118 mg/dL — ABNORMAL HIGH (ref 70–99)
Glucose-Capillary: 171 mg/dL — ABNORMAL HIGH (ref 70–99)

## 2023-11-14 LAB — CBC
HCT: 27.4 % — ABNORMAL LOW (ref 39.0–52.0)
Hemoglobin: 9 g/dL — ABNORMAL LOW (ref 13.0–17.0)
MCH: 31.5 pg (ref 26.0–34.0)
MCHC: 32.8 g/dL (ref 30.0–36.0)
MCV: 95.8 fL (ref 80.0–100.0)
Platelets: 266 K/uL (ref 150–400)
RBC: 2.86 MIL/uL — ABNORMAL LOW (ref 4.22–5.81)
RDW: 12.8 % (ref 11.5–15.5)
WBC: 7.4 K/uL (ref 4.0–10.5)
nRBC: 0 % (ref 0.0–0.2)

## 2023-11-14 MED ORDER — METOPROLOL SUCCINATE ER 50 MG PO TB24
50.0000 mg | ORAL_TABLET | Freq: Every day | ORAL | 0 refills | Status: DC
Start: 2023-11-15 — End: 2023-12-05
  Filled 2023-11-14: qty 30, 30d supply, fill #0

## 2023-11-14 MED ORDER — NITROGLYCERIN 0.4 MG SL SUBL
0.4000 mg | SUBLINGUAL_TABLET | SUBLINGUAL | 3 refills | Status: AC | PRN
  Filled 2023-11-14: qty 25, 5d supply, fill #0

## 2023-11-14 MED ORDER — ASPIRIN 81 MG PO TBEC
81.0000 mg | DELAYED_RELEASE_TABLET | Freq: Every day | ORAL | 0 refills | Status: DC
  Filled 2023-11-14: qty 90, 90d supply, fill #0

## 2023-11-14 MED ORDER — AMLODIPINE BESYLATE 10 MG PO TABS
10.0000 mg | ORAL_TABLET | Freq: Every day | ORAL | 0 refills | Status: DC
  Filled 2023-11-14: qty 30, 30d supply, fill #0

## 2023-11-14 MED ORDER — PRASUGREL HCL 10 MG PO TABS
10.0000 mg | ORAL_TABLET | Freq: Every day | ORAL | 0 refills | Status: DC
  Filled 2023-11-14: qty 30, 30d supply, fill #0

## 2023-11-14 MED ORDER — AMLODIPINE BESYLATE 5 MG PO TABS
5.0000 mg | ORAL_TABLET | Freq: Every day | ORAL | Status: DC
  Filled 2023-11-14: qty 1

## 2023-11-14 MED ORDER — EZETIMIBE 10 MG PO TABS
10.0000 mg | ORAL_TABLET | Freq: Every day | ORAL | 0 refills | Status: DC
  Filled 2023-11-14: qty 90, 90d supply, fill #0

## 2023-11-14 MED ORDER — SODIUM CHLORIDE 0.9 % IV SOLN: INTRAVENOUS | Status: DC

## 2023-11-14 MED ORDER — ISOSORBIDE MONONITRATE ER 60 MG PO TB24
60.0000 mg | ORAL_TABLET | Freq: Every day | ORAL | 0 refills | Status: DC
  Filled 2023-11-14: qty 30, 30d supply, fill #0

## 2023-11-14 MED ORDER — AMLODIPINE BESYLATE 10 MG PO TABS
10.0000 mg | ORAL_TABLET | Freq: Every day | ORAL | Status: DC
  Administered 2023-11-14: 10 mg via ORAL
  Filled 2023-11-14: qty 1

## 2023-11-14 MED ORDER — EZETIMIBE 10 MG PO TABS
10.0000 mg | ORAL_TABLET | Freq: Every day | ORAL | Status: DC
  Administered 2023-11-14: 10 mg via ORAL
  Filled 2023-11-14: qty 1

## 2023-11-14 MED ORDER — ISOSORBIDE MONONITRATE ER 60 MG PO TB24
60.0000 mg | ORAL_TABLET | Freq: Every day | ORAL | Status: DC
  Administered 2023-11-14: 60 mg via ORAL
  Filled 2023-11-14: qty 1

## 2023-11-14 MED ORDER — ATORVASTATIN CALCIUM 40 MG PO TABS
40.0000 mg | ORAL_TABLET | Freq: Every day | ORAL | 0 refills | Status: DC
  Filled 2023-11-14: qty 90, 90d supply, fill #0

## 2023-11-14 MED ORDER — PRASUGREL HCL 10 MG PO TABS
10.0000 mg | ORAL_TABLET | Freq: Every day | ORAL | Status: DC
  Administered 2023-11-14: 10 mg via ORAL
  Filled 2023-11-14: qty 1

## 2023-11-14 MED ORDER — SODIUM BICARBONATE 650 MG PO TABS
1300.0000 mg | ORAL_TABLET | Freq: Two times a day (BID) | ORAL | 0 refills | Status: DC
  Filled 2023-11-14: qty 120, 30d supply, fill #0

## 2023-11-14 MED ORDER — DAPAGLIFLOZIN PROPANEDIOL 10 MG PO TABS: 10.0000 mg | ORAL_TABLET | Freq: Every day | ORAL | Status: DC

## 2023-11-14 NOTE — Progress Notes (Signed)
 Patient admitted for NSTEMI. From home with wife. Patient has no history of home health. ICM will continue to follow for needs.    11/14/23 0851  TOC Brief Assessment  Insurance and Status Reviewed  Patient has primary care physician Yes  Home environment has been reviewed safe to discharge home  Prior level of function: independent  Prior/Current Home Services No current home services  Social Drivers of Health Review SDOH reviewed no interventions necessary  Readmission risk has been reviewed Yes  Transition of care needs no transition of care needs at this time

## 2023-11-14 NOTE — TOC Transition Note (Signed)
 Transition of Care [facility]) - Discharge Note   Patient Details  Name: DONYEL NESTER MRN: [id number] Date of Birth: July 14, 1975  Transition of Care [facility]) CM/SW Contact:  Roxie KANDICE Stain, RN Phone Number: 11/14/2023, 10:35 AM   Clinical Narrative:    Evalene GORMAN Rumps is stable to discharge home. Follow up on AVS. No TOC needs at this time.   Final next level of care: Home/Self Care Barriers to Discharge: Barriers Resolved   Patient Goals and CMS Choice Patient states their goals for this hospitalization and ongoing recovery are:: return home          Discharge Placement             home          Discharge Plan and Services Additional resources added to the After Visit Summary for                                       Social Drivers of Health (SDOH) Interventions SDOH Screenings   Food Insecurity: No Food Insecurity (11/12/2023)  Housing: Low Risk  (11/12/2023)  Transportation Needs: No Transportation Needs (11/12/2023)  Utilities: Not At Risk (11/12/2023)  Depression (PHQ2-9): Low Risk  (02/12/2021)  Tobacco Use: Low Risk  (11/11/2023)     Readmission Risk Interventions     No data to display

## 2023-11-14 NOTE — Progress Notes (Signed)
 CARDIAC REHAB PHASE I   PRE:  Rate/Rhythm: 68 SR  BP:  Supine:   Sitting: 151/85  Standing:    SaO2: 97% RA  MODE:  Ambulation: 340 ft   POST:  Rate/Rhythem: 81 SR  BP:  Supine:   Sitting: 167/85  Standing:    SaO2: 98% RA  1045-1131 Patient tolerated ambulation fair with assist x1. Gait steady, c/o feeling lightheaded, tired, winded after ambulation, resolved with rest. MI education completed including Effient  use, CP, NTG use, and calling 911, restrictions, risk factor modification, and activity progression. MI book, nutrition, and exercise handouts given. Patient eager and verbalizes understanding of information given. Discussed Phase 2 cardiac rehab with patient, and he is interested in the program at [facility]. Will fax referral.  Arnoldo CHRISTELLA Gal, MS, ACSM CEP 11/14/2023 1131

## 2023-11-14 NOTE — Discharge Summary (Signed)
 Physician Discharge Summary  QUILLAN WHITTER FMW:[id number] DOB: Mar 24, 1976 DOA: 11/10/2023  PCP: Thurmond Cathlyn LABOR., MD  Admit date: 11/10/2023 Discharge date: 11/14/2023  Admitted From: Home Disposition: Home  Recommendations for Outpatient Follow-up:  Follow up with PCP in 1-2 weeks Please obtain BMP/CBC in 2 days Follow-up outpatient nephrology Hold Lasix  and Farxiga  until instructed by outpatient provider Start aspirin  and Effient .  Plans to take this at least for 1 year.  Eventually Effient  can be switched to Plavix.  Continue aspirin  indefinitely. Imdur  increased 60 mg daily, Norvasc  10 mg orally daily added, Toprol -XL added. Sodium bicarb twice daily prescribed.  Discussed with nephrology, Dr. Tobie   Discharge Condition: Stable CODE STATUS: Full code Diet recommendation: Heart healthy  Brief/Interim Summary: Brief Narrative:  48 year old with history of renal and pancreatic transplant 2003, diabetic retinopathy, diabetic gastroparesis, Hashimoto's thyroiditis, hypogonadotrophic hypogonadism, GAD, depression, and DM1 presented to the ED with complaining of left-sided chest pain.  Initially given aspirin .  Initial troponins trended up to 07, chest x-ray showed minimal ST depression in lead II otherwise normal sinus rhythm.  Due to rising troponin patient started on heparin  drip.  Echocardiogram overall unremarkable, A1c 6.4, LDL 104.  Underwent left heart catheterization on 7/21 showing two-vessel CAD with no interventions performed.  Medical management is recommended at this time.  He will need a good blood pressure control. Patient really prefers to go home therefore we will discharge him with outpatient follow-up.  Outpatient medication adjusted as mentioned above   Assessment & Plan:  Principal Problem:   NSTEMI (non-ST elevated myocardial infarction) (HCC) Active Problems:   Hyperkalemia   History of renal transplant   Hypothyroidism due to Hashimoto's thyroiditis    Hypogonadotropic hypogonadism (HCC)   Chest pain   AKI (acute kidney injury) (HCC)   History of pancreas transplant (HCC)   Insulin  dependent type 1 diabetes mellitus (HCC)   GAD (generalized anxiety disorder)   GERD (gastroesophageal reflux disease)   Pulmonary edema   Elevated blood pressure reading    NSTEMI Chest pain - Due to rising troponin patient started on heparin  drip.  Echocardiogram overall unremarkable, A1c 6.4, LDL 104.  Underwent left heart catheterization on 7/21 showing two-vessel CAD with no interventions performed.  Medical management is recommended at this time.  Medication to be adjusted as mentioned above He will be on DAPT at least for 1 year   Hypertension, uncontrolled Will increase Imdur  60 mg daily Add 5 mg daily Norvasc .  Further adjust as necessary IV as needed medications   Hyperkalemia -Elevated potassium 5.2.  rsolved   CKD stage IIIb Metabolic acidosis Baseline creatinine 1.8, admission creatinine 1.94 Creatinine up today 2.09.  Patient would really like to go home.  Will hold his home Lasix  and Farxiga  until outpatient repeat blood work.  Nephrology team is aware.  Will also give bicarb supplements     History of renal transplant History of pancreas transplant - Checking Tac level - in process and continue Prograf .   History of insulin -dependent DM type I - Resume home insulin  pump   Generalized anxiety disorder -Continue BuSpar    GERD -Continue Protonix    History of Hashimoto thyroiditis with hypothyroidism - Continue levothyroxine  75 mcg daily   Green-colored urine - Patient reported he takes methylene blue to support his health at cellular level and methylene blue can cause green color urine.   DVT prophylaxis:  IV heparin  gtts Code Status:  Full Code Diet: Cardiac Family Communication: Wife at bedside Disposition Plan: Discharge  Subjective: Patient seen at bedside sitting up in the recliner.  He is really wanting to go  home today and will follow-up outpatient.  He understands that his elevated blood pressure and rising creatinine.  He is willing to follow all instructions.  Examination:  General exam: Appears calm and comfortable  Respiratory system: Clear to auscultation. Respiratory effort normal. Cardiovascular system: S1 & S2 heard, RRR. No JVD, murmurs, rubs, gallops or clicks. No pedal edema. Gastrointestinal system: Abdomen is nondistended, soft and nontender. No organomegaly or masses felt. Normal bowel sounds heard. Central nervous system: Alert and oriented. No focal neurological deficits. Extremities: Symmetric 5 x 5 power. Skin: No rashes, lesions or ulcers Psychiatry: Judgement and insight appear normal. Mood & affect appropriate.    Discharge Diagnoses:  Principal Problem:   NSTEMI (non-ST elevated myocardial infarction) (HCC) Active Problems:   Hyperkalemia   History of renal transplant   Hypothyroidism due to Hashimoto's thyroiditis   Hypogonadotropic hypogonadism (HCC)   Chest pain   AKI (acute kidney injury) (HCC)   History of pancreas transplant (HCC)   Insulin  dependent type 1 diabetes mellitus (HCC)   GAD (generalized anxiety disorder)   GERD (gastroesophageal reflux disease)   Pulmonary edema   Elevated blood pressure reading      Discharge Exam: Vitals:   11/14/23 0908 11/14/23 1025  BP: (!) 173/88 (!) 161/82  Pulse: 72   Resp:    Temp:    SpO2:     Vitals:   11/14/23 0401 11/14/23 0802 11/14/23 0908 11/14/23 1025  BP: (!) 162/93 (!) 160/77 (!) 173/88 (!) 161/82  Pulse: 66  72   Resp: 19 15    Temp:  98.7 F (37.1 C)    TempSrc:  Oral    SpO2: 96% 97%    Weight:      Height:          Discharge Instructions  Discharge Instructions     Amb Referral to Cardiac Rehabilitation   Complete by: As directed    Patient lives near [facility] will send referral for Phase 2 Cardiac Rehab there.   Diagnosis: NSTEMI   After initial evaluation and  assessments completed: Virtual Based Care may be provided alone or in conjunction with Phase 2 Cardiac Rehab based on patient barriers.: Yes   Intensive Cardiac Rehabilitation (ICR) [facility] location only OR Traditional Cardiac Rehabilitation (TCR) *If criteria for ICR are not met will enroll in TCR ([facility] only): Yes      Allergies as of 11/14/2023       Reactions   Tomato Nausea And Vomiting   Immediate vomiting   Other Nausea And Vomiting   RAW Canteloupe, banana, tomatoes, all raw vegetables  Unless the vegetables have dressing on them. 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   Codeine Nausea And Vomiting   Metoclopramide Hcl Nausea And Vomiting        Medication List     PAUSE taking these medications    Farxiga  10 MG Tabs tablet Wait to take this until your doctor or other care provider tells you to start again. Generic drug: dapagliflozin  propanediol Take 10 mg by mouth daily.   furosemide  20 MG tablet Wait to take this until your doctor or other care provider tells you to start again. Commonly known as: LASIX  Take 20 mg by mouth daily as needed for edema (for legs).       TAKE these medications    amLODipine  10 MG tablet Commonly known as: NORVASC   Take 1 tablet (10 mg total) by mouth daily.   aspirin  EC 81 MG tablet Take 1 tablet (81 mg total) by mouth daily. Swallow whole. Start taking on: November 15, 2023   atorvastatin  40 MG tablet Commonly known as: LIPITOR Take 1 tablet (40 mg total) by mouth daily. Start taking on: November 15, 2023   Baqsimi  Two Pack 3 MG/DOSE Powd Generic drug: Glucagon  Place 3 mg into the nose once as needed for up to 1 dose.   busPIRone  10 MG tablet Commonly known as: BUSPAR  Take 10 mg by mouth 2 (two) times daily.   cholecalciferol 25 MCG (1000 UNIT) tablet Commonly known as: VITAMIN D3 Take 1,000 Units by mouth daily.   Dexcom G6 Sensor Misc Use as instructed to check blood sugar. Change every 10 days   Dexcom G6  Transmitter Misc Use as instructed. Change every 90 days   esomeprazole  40 MG capsule Commonly known as: NEXIUM  Take 40 mg by mouth 2 (two) times daily.   ezetimibe  10 MG tablet Commonly known as: ZETIA  Take 1 tablet (10 mg total) by mouth daily.   Fiasp  100 UNIT/ML Soln Generic drug: Insulin  Aspart (w/Niacinamide) INJECT UP TO 50 UNITS VIA INSULIN  PUMP DAILY   fluticasone 50 MCG/ACT nasal spray Commonly known as: FLONASE Place 1 spray into both nostrils daily.   isosorbide  mononitrate 60 MG 24 hr tablet Commonly known as: IMDUR  Take 1 tablet (60 mg total) by mouth daily. Start taking on: November 15, 2023   levothyroxine  75 MCG tablet Commonly known as: SYNTHROID  TAKE 1 TABLET(75 MCG) BY MOUTH DAILY   METHYLENE BLUE (BULK-SOLID) Powd Take 1 Scoop by mouth daily. Mix with a beverage and drink   metoprolol  succinate 50 MG 24 hr tablet Commonly known as: TOPROL -XL Take 1 tablet (50 mg total) by mouth daily. Take with or immediately following a meal. Start taking on: November 15, 2023   multivitamins ther. w/minerals Tabs tablet Take 1 tablet by mouth daily.   nitroGLYCERIN  0.4 MG SL tablet Commonly known as: Nitrostat  Place 1 tablet (0.4 mg total) under the tongue every 5 (five) minutes as needed for chest pain.   Omnipod 5 DexG7G6 Pods Gen 5 Misc Use 1 pod every 3 days   prasugrel  10 MG Tabs tablet Commonly known as: EFFIENT  Take 1 tablet (10 mg total) by mouth daily.   promethazine  25 MG tablet Commonly known as: PHENERGAN  Take 25 mg by mouth every 6 (six) hours as needed.   sodium bicarbonate  650 MG tablet Take 2 tablets (1,300 mg total) by mouth 2 (two) times daily.   SYRINGE/NEEDLE (DISP) 1 ML 23G X 1 1 ML Misc Use every week   tacrolimus  1 MG capsule Commonly known as: PROGRAF  Take 1-2 mg by mouth See admin instructions. Take 2 capsules by mouth in the morning and 1 capsule at night   traMADol 50 MG tablet Commonly known as: ULTRAM Take 50-100 mg by  mouth every 6 (six) hours as needed for pain.   TUBERCULIN SYR 1CC/25GX5/8 25G X 5/8 1 ML Misc Commonly known as: B-D TB SYRINGE 1CC/25GX5/8 USE 1 EVERY 2 WEEKS   Veltassa 8.4 g packet Generic drug: patiromer Take 8.4 g by mouth every other day.        Allergies  Allergen Reactions   Tomato Nausea And Vomiting    Immediate vomiting    Other Nausea And Vomiting    RAW Canteloupe, banana, tomatoes, all raw vegetables  Unless the vegetables have dressing on them.  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   Codeine Nausea And Vomiting   Metoclopramide Hcl Nausea And Vomiting    You were cared for by a hospitalist during your hospital stay. If you have any questions about your discharge medications or the care you received while you were in the hospital after you are discharged, you can call the unit and asked to speak with the hospitalist on call if the hospitalist that took care of you is not available. Once you are discharged, your primary care physician will handle any further medical issues. Please note that no refills for any discharge medications will be authorized once you are discharged, as it is imperative that you return to your primary care physician (or establish a relationship with a primary care physician if you do not have one) for your aftercare needs so that they can reassess your need for medications and monitor your lab values.  You were cared for by a hospitalist during your hospital stay. If you have any questions about your discharge medications or the care you received while you were in the hospital after you are discharged, you can call the unit and asked to speak with the hospitalist on call if the hospitalist that took care of you is not available. Once you are discharged, your primary care physician will handle any further medical issues. Please note that NO REFILLS for any discharge medications will be authorized once you are discharged, as it is  imperative that you return to your primary care physician (or establish a relationship with a primary care physician if you do not have one) for your aftercare needs so that they can reassess your need for medications and monitor your lab values.  Please request your Prim.MD to go over all Hospital Tests and Procedure/Radiological results at the follow up, please get all Hospital records sent to your Prim MD by signing hospital release before you go home.  Get CBC, CMP, 2 view Chest X ray checked  by Primary MD during your next visit or SNF MD in 5-7 days ( we routinely change or add medications that can affect your baseline labs and fluid status, therefore we recommend that you get the mentioned basic workup next visit with your PCP, your PCP may decide not to get them or add new tests based on their clinical decision)  On your next visit with your primary care physician please Get Medicines reviewed and adjusted.  If you experience worsening of your admission symptoms, develop shortness of breath, life threatening emergency, suicidal or homicidal thoughts you must seek medical attention immediately by calling 911 or calling your MD immediately  if symptoms less severe.  You Must read complete instructions/literature along with all the possible adverse reactions/side effects for all the Medicines you take and that have been prescribed to you. Take any new Medicines after you have completely understood and accpet all the possible adverse reactions/side effects.   Do not drive, operate heavy machinery, perform activities at heights, swimming or participation in water activities or provide baby sitting services if your were admitted for syncope or siezures until you have seen by Primary MD or a Neurologist and advised to do so again.  Do not drive when taking Pain medications.   Procedures/Studies: CARDIAC CATHETERIZATION Result Date: 11/13/2023   Dist RCA lesion is 70% stenosed.   Prox LAD to Mid  LAD lesion is 30% stenosed.   1st Mrg-1 lesion is 70% stenosed.   1st Mrg-2 lesion is 85%  stenosed.   2nd Mrg lesion is 95% stenosed.   Prox Cx to Mid Cx lesion is 50% stenosed. Moderate 2 vessel CAD. Plan: recommend medical therapy. Patient is severely hypertensive. Reports no recurrent chest pain over last 3 days. The second OM is too small for PCI. If he has refractory angina despite optimal medical therapy could consider PCI of OM1   ECHOCARDIOGRAM COMPLETE Result Date: 11/11/2023    ECHOCARDIOGRAM REPORT   Patient Name:   JAKIM DRAPEAU Date of Exam: 11/11/2023 Medical Rec #:  [id number]       Height:       69.0 in Accession #:    [id number]      Weight:       206.8 lb Date of Birth:  1975/12/08        BSA:          2.096 m Patient Age:    47 years        BP:           144/81 mmHg Patient Gender: M               HR:           81 bpm. Exam Location:  Inpatient Procedure: 2D Echo, Cardiac Doppler and Color Doppler (Both Spectral and Color            Flow Doppler were utilized during procedure). Indications:    Chest Pain R07.9  History:        Patient has no prior history of Echocardiogram examinations.                 Risk Factors:Diabetes.  Sonographer:    Jayson Gaskins Referring Phys: [id number] SUBRINA SUNDIL IMPRESSIONS  1. Left ventricular ejection fraction, by estimation, is 60 to 65%. The left ventricle has normal function. The left ventricle has no regional wall motion abnormalities. Left ventricular diastolic parameters were normal.  2. Right ventricular systolic function is normal. The right ventricular size is normal. Tricuspid regurgitation signal is inadequate for assessing PA pressure.  3. The mitral valve is normal in structure. Mild mitral valve regurgitation. No evidence of mitral stenosis.  4. The aortic valve is normal in structure. Aortic valve regurgitation is not visualized. No aortic stenosis is present.  5. The inferior vena cava is normal in size with greater than 50% respiratory  variability, suggesting right atrial pressure of 3 mmHg. FINDINGS  Left Ventricle: Left ventricular ejection fraction, by estimation, is 60 to 65%. The left ventricle has normal function. The left ventricle has no regional wall motion abnormalities. The left ventricular internal cavity size was normal in size. There is  no left ventricular hypertrophy. Left ventricular diastolic parameters were normal. Right Ventricle: The right ventricular size is normal. No increase in right ventricular wall thickness. Right ventricular systolic function is normal. Tricuspid regurgitation signal is inadequate for assessing PA pressure. Left Atrium: Left atrial size was normal in size. Right Atrium: Right atrial size was normal in size. Pericardium: There is no evidence of pericardial effusion. Mitral Valve: The mitral valve is normal in structure. Mild mitral valve regurgitation, with centrally-directed jet. No evidence of mitral valve stenosis. Tricuspid Valve: The tricuspid valve is normal in structure. Tricuspid valve regurgitation is not demonstrated. No evidence of tricuspid stenosis. Aortic Valve: The aortic valve is normal in structure. Aortic valve regurgitation is not visualized. No aortic stenosis is present. Aortic valve mean gradient measures 7.0 mmHg. Aortic valve peak gradient measures 10.6 mmHg. Aortic  valve area, by VTI measures 2.87 cm. Pulmonic Valve: The pulmonic valve was normal in structure. Pulmonic valve regurgitation is not visualized. No evidence of pulmonic stenosis. Aorta: The aortic root is normal in size and structure. Venous: The inferior vena cava is normal in size with greater than 50% respiratory variability, suggesting right atrial pressure of 3 mmHg. IAS/Shunts: No atrial level shunt detected by color flow Doppler.  LEFT VENTRICLE PLAX 2D LVIDd:         4.90 cm   Diastology LVIDs:         2.60 cm   LV e' medial:    9.03 cm/s LV PW:         1.10 cm   LV E/e' medial:  13.5 LV IVS:        0.90 cm    LV e' lateral:   12.50 cm/s LVOT diam:     1.90 cm   LV E/e' lateral: 9.8 LV SV:         114 LV SV Index:   54 LVOT Area:     2.84 cm  RIGHT VENTRICLE RV S prime:     13.90 cm/s TAPSE (M-mode): 2.3 cm LEFT ATRIUM             Index        RIGHT ATRIUM           Index LA Vol (A2C):   36.4 ml 17.37 ml/m  RA Area:     10.90 cm LA Vol (A4C):   53.4 ml 25.48 ml/m  RA Volume:   23.80 ml  11.36 ml/m LA Biplane Vol: 45.1 ml 21.52 ml/m  AORTIC VALVE AV Area (Vmax):    2.73 cm AV Area (Vmean):   2.86 cm AV Area (VTI):     2.87 cm AV Vmax:           163.00 cm/s AV Vmean:          126.000 cm/s AV VTI:            0.396 m AV Peak Grad:      10.6 mmHg AV Mean Grad:      7.0 mmHg LVOT Vmax:         157.00 cm/s LVOT Vmean:        127.000 cm/s LVOT VTI:          0.401 m LVOT/AV VTI ratio: 1.01  AORTA Ao Root diam: 2.70 cm MITRAL VALVE MV Area (PHT): 3.58 cm     SHUNTS MV Decel Time: 212 msec     Systemic VTI:  0.40 m MV E velocity: 122.00 cm/s  Systemic Diam: 1.90 cm MV A velocity: 87.00 cm/s MV E/A ratio:  1.40 Mihai Croitoru MD Electronically signed by Jerel Balding MD Signature Date/Time: 11/11/2023/1:08:07 PM    Final    DG Chest Portable 1 View Result Date: 11/11/2023 EXAM: 1 VIEW XRAY OF THE CHEST 11/11/2023 12:12:59 AM COMPARISON: CT chest dated 11/25/2022. CLINICAL HISTORY: CP. FINDINGS: LUNGS AND PLEURA: Mild pulmonary edema. No pleural effusion. No pneumothorax. HEART AND MEDIASTINUM: Mild cardiomegaly. BONES AND SOFT TISSUES: No acute osseous abnormality. IMPRESSION: 1. Mild cardiomegaly with suspected very mild pulmonary edema. Electronically signed by: Pinkie Pebbles MD 11/11/2023 12:20 AM EDT RP Workstation: [id number]     The results of significant diagnostics from this hospitalization (including imaging, microbiology, ancillary and laboratory) are listed below for reference.     Microbiology: Recent Results (from the past 240 hours)  MRSA Next Gen by PCR,  Nasal     Status: None   Collection  Time: 11/11/23  6:56 AM   Specimen: Nasal Mucosa; Nasal Swab  Result Value Ref Range Status   MRSA by PCR Next Gen NOT DETECTED NOT DETECTED Final    Comment: (NOTE) The GeneXpert MRSA Assay (FDA approved for NASAL specimens only), is one component of a comprehensive MRSA colonization surveillance program. It is not intended to diagnose MRSA infection nor to guide or monitor treatment for MRSA infections. Test performance is not FDA approved in patients less than 27 years old. Performed at [facility], [address] [place], [place] [address]   Surgical pcr screen     Status: None   Collection Time: 11/12/23  2:08 AM   Specimen: Nasal Mucosa; Nasal Swab  Result Value Ref Range Status   MRSA, PCR NEGATIVE NEGATIVE Final   Staphylococcus aureus NEGATIVE NEGATIVE Final    Comment: (NOTE) The Xpert SA Assay (FDA approved for NASAL specimens in patients 75 years of age and older), is one component of a comprehensive surveillance program. It is not intended to diagnose infection nor to guide or monitor treatment. Performed at [facility], [address] [place], [place] [address]      Labs: BNP (last 3 results) Recent Labs    11/11/23 0007  BNP 113.2*   Basic Metabolic Panel: Recent Labs  Lab 11/11/23 0007 11/12/23 0911 11/13/23 0244 11/13/23 1441 11/14/23 0240  NA 139 139  140 140  --  138  K 5.2* 4.2  4.3 4.4  --  4.7  CL 114* 116*  117* 116*  --  116*  CO2 17* 14*  15* 18*  --  19*  GLUCOSE 124* 159*  162* 135*  --  142*  BUN 50* 42*  42* 45*  --  38*  CREATININE 1.94* 1.59*  1.62* 1.72* 1.68* 2.09*  CALCIUM  9.3 8.1*  8.1* 8.8*  --  9.1  MG  --  2.0 2.2  --  1.9  PHOS  --  2.5  --   --   --    Liver Function Tests: Recent Labs  Lab 11/12/23 0911  AST 18  ALT 20  ALKPHOS 74  BILITOT 0.4  PROT 5.5*  ALBUMIN 2.6*   No results for input(s): LIPASE, AMYLASE in the last 168 hours. No results for input(s): AMMONIA in the last 168  hours. CBC: Recent Labs  Lab 11/11/23 0007 11/12/23 0911 11/13/23 0244 11/13/23 1441 11/14/23 0240  WBC 11.9* 7.5 7.1 6.6 7.4  NEUTROABS 9.1*  --   --   --   --   HGB 10.7* 9.5* 9.7* 10.0* 9.0*  HCT 33.3* 30.0* 29.6* 30.3* 27.4*  MCV 97.7 99.3 94.6 95.6 95.8  PLT 308 256 297 286 266   Cardiac Enzymes: No results for input(s): CKTOTAL, CKMB, CKMBINDEX, TROPONINI in the last 168 hours. BNP: Invalid input(s): POCBNP CBG: Recent Labs  Lab 11/13/23 1124 11/13/23 1629 11/13/23 2027 11/14/23 0015 11/14/23 0400  GLUCAP 187* 182* 209* 171* 118*   D-Dimer No results for input(s): DDIMER in the last 72 hours. Hgb A1c No results for input(s): HGBA1C in the last 72 hours. Lipid Profile No results for input(s): CHOL, HDL, LDLCALC, TRIG, CHOLHDL, LDLDIRECT in the last 72 hours. Thyroid  function studies No results for input(s): TSH, T4TOTAL, T3FREE, THYROIDAB in the last 72 hours.  Invalid input(s): FREET3 Anemia work up No results for input(s): VITAMINB12, FOLATE, FERRITIN, TIBC, IRON, RETICCTPCT in the  last 72 hours. Urinalysis    Component Value Date/Time   COLORURINE YELLOW 11/11/2023 0600   APPEARANCEUR HAZY (A) 11/11/2023 0600   LABSPEC 1.014 11/11/2023 0600   PHURINE 5.0 11/11/2023 0600   GLUCOSEU >=500 (A) 11/11/2023 0600   HGBUR NEGATIVE 11/11/2023 0600   BILIRUBINUR NEGATIVE 11/11/2023 0600   KETONESUR NEGATIVE 11/11/2023 0600   PROTEINUR 100 (A) 11/11/2023 0600   UROBILINOGEN 0.2 05/19/2011 0014   NITRITE NEGATIVE 11/11/2023 0600   LEUKOCYTESUR NEGATIVE 11/11/2023 0600   Sepsis Labs Recent Labs  Lab 11/12/23 0911 11/13/23 0244 11/13/23 1441 11/14/23 0240  WBC 7.5 7.1 6.6 7.4   Microbiology Recent Results (from the past 240 hours)  MRSA Next Gen by PCR, Nasal     Status: None   Collection Time: 11/11/23  6:56 AM   Specimen: Nasal Mucosa; Nasal Swab  Result Value Ref Range Status   MRSA by PCR Next Gen  NOT DETECTED NOT DETECTED Final    Comment: (NOTE) The GeneXpert MRSA Assay (FDA approved for NASAL specimens only), is one component of a comprehensive MRSA colonization surveillance program. It is not intended to diagnose MRSA infection nor to guide or monitor treatment for MRSA infections. Test performance is not FDA approved in patients less than 32 years old. Performed at [facility], [address], [place], [place] [address]   Surgical pcr screen     Status: None   Collection Time: 11/12/23  2:08 AM   Specimen: Nasal Mucosa; Nasal Swab  Result Value Ref Range Status   MRSA, PCR NEGATIVE NEGATIVE Final   Staphylococcus aureus NEGATIVE NEGATIVE Final    Comment: (NOTE) The Xpert SA Assay (FDA approved for NASAL specimens in patients 103 years of age and older), is one component of a comprehensive surveillance program. It is not intended to diagnose infection nor to guide or monitor treatment. Performed at [facility], [address], [place], [place] [address]      Time coordinating discharge:  I have spent 35 minutes face to face with the patient and on the ward discussing the patients care, assessment, plan and disposition with other care givers. >50% of the time was devoted counseling the patient about the risks and benefits of treatment/Discharge disposition and coordinating care.   SIGNED:   Burgess JAYSON Dare, MD  [facility] 11/14/2023, 11:48 AM   If 7PM-7AM, please contact night-coverage

## 2023-11-14 NOTE — Progress Notes (Signed)
 Patient Name: Daniel Hill Date of Encounter: 11/14/2023 [facility] Cardiologist: Gordy Bergamo, MD  11/10/2023 .admit Length of stay: 3  Interval Summary  .    Daniel Hill is a 48 y.o. 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  Underwent cardiac catheterization on 11/13/2023, recommended medical therapy.  Presently remains asymptomatic without recurrence of chest pain.  His wife is present at the bedside.  Physical Exam    Vitals:   11/13/23 2343 11/14/23 0401 11/14/23 0802 11/14/23 0908  BP: (!) 161/88 (!) 162/93 (!) 160/77 (!) 173/88  Pulse: 67 66  72  Resp: 19 19 15    Temp: 98.5 F (36.9 C)  98.7 F (37.1 C)   TempSrc: Axillary  Oral   SpO2: 97% 96% 97%   Weight:      Height:       Body mass index is 31.01 kg/m.   Physical Exam Constitutional:      Appearance: He is obese.  Neck:     Vascular: No carotid bruit or JVD.  Cardiovascular:     Rate and Rhythm: Normal rate and regular rhythm.     Pulses:          Popliteal pulses are 2+ on the right side and 2+ on the left side.       Dorsalis pedis pulses are 1+ on the right side and 1+ on the left side.       Posterior tibial pulses are 1+ on the right side and 1+ on the left side.     Heart sounds: Normal heart sounds. No murmur heard.    No gallop.  Pulmonary:     Effort: Pulmonary effort is normal.     Breath sounds: Normal breath sounds.  Abdominal:     General: Bowel sounds are normal.     Palpations: Abdomen is soft.  Musculoskeletal:     Right lower leg: No edema.     Left lower leg: No edema.        11/13/2023    4:00 AM 11/12/2023    7:18 PM  11/11/2023    6:52 AM  Last 3 Weights  Weight (lbs) 210 lb 210 lb 206 lb 12.7 oz  Weight (kg) 95.255 kg 95.255 kg 93.8 kg      Labs   Lab Results  Component Value Date   NA 138 11/14/2023   K 4.7 11/14/2023   CO2 19 (L) 11/14/2023   GLUCOSE 142 (H) 11/14/2023   BUN 38 (H) 11/14/2023   CREATININE 2.09 (H) 11/14/2023   CALCIUM  9.1 11/14/2023   GFRNONAA 38 (L) 11/14/2023       Latest Ref Rng & Units 11/14/2023    2:40 AM 11/13/2023    2:41 PM 11/13/2023    2:44 AM  BMP  Glucose 70 - 99 mg/dL 857   864   BUN 6 - 20 mg/dL 38   45   Creatinine 9.38 - 1.24 mg/dL 7.90  8.31  8.27   Sodium 135 - 145 mmol/L 138   140   Potassium 3.5 - 5.1 mmol/L 4.7   4.4   Chloride 98 - 111 mmol/L 116   116   CO2 22 - 32 mmol/L 19  18   Calcium  8.9 - 10.3 mg/dL 9.1   8.8        Latest Ref Rng & Units 11/14/2023    2:40 AM 11/13/2023    2:41 PM 11/13/2023    2:44 AM  CBC  WBC 4.0 - 10.5 K/uL 7.4  6.6  7.1   Hemoglobin 13.0 - 17.0 g/dL 9.0  89.9  9.7   Hematocrit 39.0 - 52.0 % 27.4  30.3  29.6   Platelets 150 - 400 K/uL 266  286  297     Lab Results  Component Value Date   CHOL 165 11/11/2023   HDL 54 11/11/2023   LDLCALC 104 (H) 11/11/2023   TRIG 35 11/11/2023   CHOLHDL 3.1 11/11/2023    Lab Results  Component Value Date   TSH 3.660 07/21/2020    Lab Results  Component Value Date   HGBA1C 6.4 (H) 11/11/2023    Cardiac Panel (last 3 results) Recent Labs    11/11/23 1139 11/11/23 1317 11/11/23 1605  TROPONINIHS 601* 593* 564*    BNP (last 3 results) Recent Labs    11/11/23 0007  BNP 113.2*     Intake/Output Summary (Last 24 hours) at 11/14/2023 0915 Last data filed at 11/14/2023 9196 Gross per 24 hour  Intake 1850.03 ml  Output 625 ml  Net 1225.03 ml    Net IO Since Admission: 2,569.74 mL [11/14/23 0915]  Tele/EKG/Cardiac studies    Telemetry: Personally reviewed, no significant arrhythmias.  EKG:  11/13/2023: Normal sinus rhythm, minimal nonspecific ST  depressions in anterior leads unchanged from prior EKG.  ECHOCARDIOGRAM COMPLETE 11/11/2023  1. Left ventricular ejection fraction, by estimation, is 60 to 65%. The left ventricle has normal function. The left ventricle has no regional wall motion abnormalities. Left ventricular diastolic parameters were normal. 2. Right ventricular systolic function is normal. The right ventricular size is normal. Tricuspid regurgitation signal is inadequate for assessing PA pressure. 3. The mitral valve is normal in structure. Mild mitral valve regurgitation. No evidence of mitral stenosis. 4. The aortic valve is normal in structure. Aortic valve regurgitation is not visualized. No aortic stenosis is present. 5. The inferior vena cava is normal in size with greater than 50% respiratory variability, suggesting right atrial pressure of 3 mmHg.   Coronary angiogram 11/13/2023:  Plan: recommend medical therapy. Patient is severely hypertensive. Reports no recurrent chest pain over last 3 days. The second OM is too small for PCI. If he has refractory angina despite optimal medical therapy could consider PCI of OM1   Current Meds:     Current Facility-Administered Medications:    acetaminophen  (TYLENOL ) tablet 650 mg, 650 mg, Oral, Q6H PRN **OR** acetaminophen  (TYLENOL ) suppository 650 mg, 650 mg, Rectal, Q6H PRN, [place], Peter M, MD   amLODipine  (NORVASC ) tablet 10 mg, 10 mg, Oral, Daily, Judaea Burgoon, MD   aspirin  EC tablet 81 mg, 81 mg, Oral, Daily, [place], Peter M, MD, 81 mg at 11/14/23 0908   atorvastatin  (LIPITOR) tablet 40 mg, 40 mg, Oral, Daily, [place], Peter M, MD, 40 mg at 11/14/23 9092   busPIRone  (BUSPAR ) tablet 10 mg, 10 mg, Oral, BID, Jordan, Peter M, MD, 10 mg at 11/14/23 9091   Chlorhexidine  Gluconate Cloth 2 % PADS 6 each, 6 each, Topical, Daily, Jordan, Peter M, MD, 6 each at 11/13/23 1105   dapagliflozin  propanediol (FARXIGA ) tablet 10 mg, 10 mg, Oral, Daily, Milea Klink, MD   enoxaparin  (LOVENOX )  injection 40 mg, 40 mg, Subcutaneous, Q24H, [place], Peter  M, MD, 40 mg at 11/14/23 0909   esomeprazole  (NEXIUM ) capsule 40 mg, 40 mg, Oral, BID AC, Jordan, Peter M, MD, 40 mg at 11/14/23 9294   ezetimibe  (ZETIA ) tablet 10 mg, 10 mg, Oral, Daily, Aleathea Pugmire, MD   guaiFENesin  (ROBITUSSIN) 100 MG/5ML liquid 5 mL, 5 mL, Oral, Q4H PRN, [place], Peter M, MD   hydrALAZINE  (APRESOLINE ) injection 10 mg, 10 mg, Intravenous, Q4H PRN, [place], Peter M, MD, 10 mg at 11/12/23 2000   insulin  pump, , Subcutaneous, Q4H, [place], Peter M, MD, Given at 11/14/23 0401   ipratropium-albuterol  (DUONEB) 0.5-2.5 (3) MG/3ML nebulizer solution 3 mL, 3 mL, Nebulization, Q4H PRN, Jordan, Peter M, MD   isosorbide  mononitrate (IMDUR ) 24 hr tablet 60 mg, 60 mg, Oral, Daily, Amin, Ankit C, MD, 60 mg at 11/14/23 0908   levothyroxine  (SYNTHROID ) tablet 75 mcg, 75 mcg, Oral, Q0600, [place], Peter M, MD, 75 mcg at 11/14/23 9294   LORazepam  (ATIVAN ) tablet 0.5 mg, 0.5 mg, Oral, Q6H PRN, Jordan, Peter M, MD, 0.5 mg at 11/13/23 0410   metoprolol  succinate (TOPROL -XL) 24 hr tablet 50 mg, 50 mg, Oral, Daily, [place], Peter M, MD, 50 mg at 11/14/23 9091   metoprolol  tartrate (LOPRESSOR ) injection 5 mg, 5 mg, Intravenous, Q4H PRN, [place], Peter M, MD   nitroGLYCERIN  (NITROSTAT ) SL tablet 0.4 mg, 0.4 mg, Sublingual, Q5 min PRN, [place], Peter M, MD   Oral care mouth rinse, 15 mL, Mouth Rinse, PRN, [place], Peter M, MD   promethazine  (PHENERGAN ) 25 mg in sodium chloride  0.9 % 50 mL IVPB, 25 mg, Intravenous, Q6H PRN, Sundil, Subrina, MD, Stopped at 11/13/23 2148   senna-docusate (Senokot-S) tablet 1 tablet, 1 tablet, Oral, QHS PRN, [place], Peter M, MD   sodium bicarbonate  tablet 650 mg, 650 mg, Oral, BID, Jordan, Peter M, MD, 650 mg at 11/14/23 0908   sodium chloride  flush (NS) 0.9 % injection 3 mL, 3 mL, Intravenous, Q12H, [place], Peter M, MD, 3 mL at 11/13/23 9041   sodium chloride  flush (NS) 0.9 % injection 3 mL, 3 mL, Intravenous, Q12H, [place],  Peter M, MD, 3 mL at 11/13/23 2131   sodium chloride  flush (NS) 0.9 % injection 3 mL, 3 mL, Intravenous, PRN, [place], Peter M, MD   sodium chloride  flush (NS) 0.9 % injection 3 mL, 3 mL, Intravenous, Q12H, [place], Peter M, MD   sodium chloride  flush (NS) 0.9 % injection 3 mL, 3 mL, Intravenous, PRN, Jordan, Peter M, MD   tacrolimus  (PROGRAF ) capsule 1 mg, 1 mg, Oral, QHS, Jordan, Peter M, MD, 1 mg at 11/13/23 2129   tacrolimus  (PROGRAF ) capsule 2 mg, 2 mg, Oral, Daily, [place], Peter M, MD, 2 mg at 11/14/23 9092   traZODone  (DESYREL ) tablet 50 mg, 50 mg, Oral, QHS PRN, Jordan, Peter M, MD, 50 mg at 11/12/23 2002  Assessment & Plan .     1. 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  Chronic stage III kidney disease secondary to diabetes mellitus type 1 3.  Renal transplant patient 4.  Primary hypertension 5.  Hypercholesterolemia 6. Mild obesity BMI 31 with major underlying condition  Recommendations:    Serum creatinine has remained stable although appears to have slightly increased overall is at baseline.  Will discontinue excessive fluid rate now with uncontrolled hypertension and I feel that it is safe for us  to discontinue IV hydration for now.  With regard to coronary artery disease, I had extensive discussion with the patient and his wife at the bedside regarding making  lifestyle changes specifically when it comes to his diet and exercise.  Patient works at [employer] and states that he will be able to walk and get more steps and exercise regularly.  He appears very motivated.  He also would like to have a consultation with nutritionist as well which is a great idea.  From coronary disease standpoint aggressive medical therapy, GDP would be appropriate.  If he has recurrence of chest pain we will consider PCI to the marginal vessel.  For now continue with aspirin  indefinitely and start Effient  10 mg daily in view of NSTEMI therapy of DAPT would be for 1 year and then consider switching to  Plavix alone.  From kidney standpoint, I would like to start him on Farxiga  10 mg daily.  This probably will help him with his weight loss and potential prevention of heart failure as well.  I do not see any contraindication for initiation of Farxiga  from renal disease standpoint.  From hypertension standpoint, I will increase the dose of amlodipine  from 5 mg to 10 mg daily.  I do believe that with diet changes, weight loss, blood pressure will improve otherwise we could certainly consider addition of either clonidine or hydralazine  or increasing the dose of beta-blocker and/or changing from metoprolol  to Bystolic would be appropriate as well.  From the obesity standpoint, weight loss has been extensively discussed, dietary modifications extensively discussed as well.  Would also recommend outpatient cardiac rehab in [place].  Will arrange outpatient follow-up with our group in [place] as well.  Patient can be discharged home this afternoon.  With regard hypercholesterolemia, statin dose has been increased to Lipitor 40 mg daily, LDL goal <55.  I have started him on Zetia  as well.  Have a low threshold for initiation of PCSK9 inhibitors especially if he has significant CAD.  Will continue to follow.  For questions or updates, please contact [facility] Please consult [email or web address] for contact info under       Signed,   Gordy Bergamo, MD, [facility] 11/14/2023, 9:15 AM [facility] [address] [place], [place] [address] Phone: [phone]. Fax:  [phone]

## 2023-11-14 NOTE — Plan of Care (Signed)
  Problem: Coping: Goal: Ability to adjust to condition or change in health will improve Outcome: Progressing   Problem: Fluid Volume: Goal: Ability to maintain a balanced intake and output will improve Outcome: Progressing   Problem: Health Behavior/Discharge Planning: Goal: Ability to identify and utilize available resources and services will improve Outcome: Progressing Goal: Ability to manage health-related needs will improve Outcome: Progressing   Problem: Metabolic: Goal: Ability to maintain appropriate glucose levels will improve Outcome: Progressing   Problem: Nutritional: Goal: Maintenance of adequate nutrition will improve Outcome: Progressing Goal: Progress toward achieving an optimal weight will improve Outcome: Progressing   Problem: Skin Integrity: Goal: Risk for impaired skin integrity will decrease Outcome: Progressing   Problem: Tissue Perfusion: Goal: Adequacy of tissue perfusion will improve Outcome: Progressing   Problem: Health Behavior/Discharge Planning: Goal: Ability to manage health-related needs will improve Outcome: Progressing   Problem: Education: Goal: Knowledge of General Education information will improve Description: Including pain rating scale, medication(s)/side effects and non-pharmacologic comfort measures Outcome: Progressing   Problem: Activity: Goal: Risk for activity intolerance will decrease Outcome: Progressing   Problem: Coping: Goal: Level of anxiety will decrease Outcome: Progressing   Problem: Pain Managment: Goal: General experience of comfort will improve and/or be controlled Outcome: Progressing   Problem: Safety: Goal: Ability to remain free from injury will improve Outcome: Progressing

## 2023-11-15 ENCOUNTER — Telehealth (HOSPITAL_COMMUNITY): Payer: Self-pay

## 2023-11-15 DIAGNOSIS — I25118 Atherosclerotic heart disease of native coronary artery with other forms of angina pectoris: Secondary | ICD-10-CM | POA: Diagnosis present

## 2023-11-15 LAB — LIPOPROTEIN A (LPA): Lipoprotein (a): 112.8 nmol/L — ABNORMAL HIGH (ref <75.0)

## 2023-11-15 NOTE — Telephone Encounter (Signed)
Per phase I cardiac rehab, fax referral to [place].

## 2023-11-21 ENCOUNTER — Telehealth (HOSPITAL_COMMUNITY): Payer: Self-pay | Admitting: *Deleted

## 2023-11-21 NOTE — Telephone Encounter (Signed)
 Per pt request for preference of location, Cardiac rehab referral Phase II faxed to [facility]. Saturnino Koyanagi RN, BSN Cardiac and Pulmonary Rehab Nurse Navigator

## 2023-11-24 ENCOUNTER — Ambulatory Visit: Attending: Emergency Medicine | Admitting: Emergency Medicine

## 2023-11-24 ENCOUNTER — Encounter: Payer: Self-pay | Admitting: Emergency Medicine

## 2023-11-24 VITALS — BP 120/74 | HR 71 | Ht 69.0 in | Wt 204.0 lb

## 2023-11-24 DIAGNOSIS — E785 Hyperlipidemia, unspecified: Secondary | ICD-10-CM

## 2023-11-24 DIAGNOSIS — I2510 Atherosclerotic heart disease of native coronary artery without angina pectoris: Secondary | ICD-10-CM

## 2023-11-24 DIAGNOSIS — I10 Essential (primary) hypertension: Secondary | ICD-10-CM | POA: Diagnosis not present

## 2023-11-24 DIAGNOSIS — E875 Hyperkalemia: Secondary | ICD-10-CM | POA: Diagnosis not present

## 2023-11-24 DIAGNOSIS — I214 Non-ST elevation (NSTEMI) myocardial infarction: Secondary | ICD-10-CM | POA: Diagnosis not present

## 2023-11-24 DIAGNOSIS — N1832 Chronic kidney disease, stage 3b: Secondary | ICD-10-CM

## 2023-11-24 MED ORDER — ISOSORBIDE MONONITRATE ER 30 MG PO TB24: 30.0000 mg | ORAL_TABLET | Freq: Every day | ORAL | 3 refills | Status: DC

## 2023-11-24 NOTE — Progress Notes (Signed)
 Cardiology Office Note:    Date:  11/24/2023  ID:  Daniel Hill, DOB 01-08-1976, MRN [id number] PCP: Thurmond Cathlyn LABOR., MD  [facility] Providers Cardiologist:  Gordy Bergamo, MD Electrophysiologist:  OLE ONEIDA HOLTS, MD       Patient Profile:       Chief Complaint: Hospital follow-up for NSTEMI History of Present Illness:  Daniel Hill is a 48 y.o. male with visit-pertinent history of history of ESRD s/p renal and pancreatic transplant in 2003 with subsequent failed pancreas transplant 2013, diabetic retinopathy, diabetic gastroparesis, Hashimoto's hypothyroidism, hypogonadism, anxiety, depression, type 1 diabetes, osteoarthritis, chronic pain, hypertension, OSA  Per chart review, he underwent kidney-pancreatic transplant in 2003, but his pancreas transplant failed in 2013. He was considered too high risk for a new pancreatic transplant. He is on chronic immunosuppression medications and follow [facility] for post-transplant care. He has been type 1 diabetic since 48 years old, suffers complications such as retinopathy and  gastroparesis. 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 He uses Omnipod DASH insulin  pump for his diabetes since 2022. He had a nuclear stress test at [facility] 09/28/21, no results /report can be found on care everywhere. TEE from 09/28/21 at [facility] showed LVEF 65-70%, trace MR. CT chest without from [facility] 11/25/22 showed heavy coronary artery calcifications.    He was seen on 11/11/2023 for the evaluation of NSTEMI.  He presented to the ER with left-sided chest pain.  He was sleeping and woke up with new onset left-sided chest pain and pressure around 9 PM the day before.  He recalled feeling short of breath over the past few weeks sometimes due to exertional activity and sometimes at rest.  High sensitive troponin 58, 207, 363, 491.  CBC with leukocytosis of 11,900 and hemoglobin of 10.7.  Chest x-ray showed mild cardiomegaly with suspected very mild  pulmonary edema.  He was admitted for NSTEMI, AKI, and hypertension with concern of CHF.  He underwent cardiac catheterization on 11/13/2023 showing moderate two-vessel CAD distal RCA lesion secondary to stenosis, proximal LAD to mid LAD lesion 30% stenosed, first marginal-1  lesion 70% stenosed, first marginal-2 lesion is 85% stenosed, second marginal lesion is 95% stenosed, proximal Cx to mid Cx lesion is 50% stenosed.  He had reported no recurrent chest pain over the past 3 days.  The second OM is too small for PCI.  Medical management was recommended.  If he has refractory angina despite optimal medical therapy could consider PCI of OM1.  He was started on aspirin  indefinitely and Effient  10 mg daily in the p.m. NSTEMI with therapy of DAPT for 1 year and then consider switching to Plavix alone.  From a kidney standpoint he was continued on Farxiga  10 mg daily to potentially help in weight loss and potential prevention of heart failure.  He was on no antihypertensives prior to arrival.  From hypertension standpoint he was started on amlodipine  10 mg, Imdur  60 mg as well as metoprolol  XL 25 mg.  He was on no statin lowering therapy prior to arrival and he was started on Lipitor 40 mg daily with a goal less than 55 as well as Zetia .   Discussed the use of AI scribe software for clinical note transcription with the patient, who gave verbal consent to proceed.  History of Present Illness Daniel Hill is a 48 year old male with coronary artery disease who presents with lightheadedness, dizziness, and fatigue.  Today he reports his  chest pains have entirely resolved.  He denies any dyspnea, orthopnea, PND, weight gain.  He reports fatigue, lightheadedness, dizziness, especially in the mornings. Nausea and vomiting have accompanied the dizziness 1 time.  He also reports some mild pedal edema shortly after discharge from the hospital that has improved.  He has been monitoring his blood pressures at home  showing average BP in the 130s.  He monitors his blood sugar daily which has been well-controlled.  He did experience myalgias on atorvastatin  and his PCP switched this to rosuvastatin.  Since he denies any myalgias.   Review of systems:  Please see the history of present illness. All other systems are reviewed and otherwise negative.      Studies Reviewed:        Echocardiogram 11/11/2023 1. Left ventricular ejection fraction, by estimation, is 60 to 65%. The  left ventricle has normal function. The left ventricle has no regional  wall motion abnormalities. Left ventricular diastolic parameters were  normal.   2. Right ventricular systolic function is normal. The right ventricular  size is normal. Tricuspid regurgitation signal is inadequate for assessing  PA pressure.   3. The mitral valve is normal in structure. Mild mitral valve  regurgitation. No evidence of mitral stenosis.   4. The aortic valve is normal in structure. Aortic valve regurgitation is  not visualized. No aortic stenosis is present.   5. The inferior vena cava is normal in size with greater than 50%  respiratory variability, suggesting right atrial pressure of 3 mmHg.   Cardiac catheterization 11/13/2023   Dist RCA lesion is 70% stenosed.   Prox LAD to Mid LAD lesion is 30% stenosed.   1st Mrg-1 lesion is 70% stenosed.   1st Mrg-2 lesion is 85% stenosed.   2nd Mrg lesion is 95% stenosed.   Prox Cx to Mid Cx lesion is 50% stenosed.   Moderate 2 vessel CAD.    Plan: recommend medical therapy. Patient is severely hypertensive. Reports no recurrent chest pain over last 3 days. The second OM is too small for PCI. If he has refractory angina despite optimal medical therapy could consider PCI of OM1  Diagnostic Dominance: Right  Risk Assessment/Calculations:              Physical Exam:   VS:  BP 120/74 (BP Location: Right Arm, Patient Position: Sitting, Cuff Size: Normal)   Pulse 71   Ht 5' 9 (1.753 m)   Wt  204 lb (92.5 kg)   BMI 30.13 kg/m    Wt Readings from Last 3 Encounters:  11/24/23 204 lb (92.5 kg)  11/13/23 210 lb (95.3 kg)  05/24/23 198 lb 6.4 oz (90 kg)    GEN: Well nourished, well developed in no acute distress NECK: No JVD; No carotid bruits CARDIAC: RRR, no murmurs, rubs, gallops RESPIRATORY:  Clear to auscultation without rales, wheezing or rhonchi  ABDOMEN: Soft, non-tender, non-distended EXTREMITIES: Trace bilateral pedal edema; No acute deformity      Assessment and Plan:  NSTEMI Coronary artery disease Admitted 10/2023 for NSTEMI.  High sensitive troponin 58, 207, 363, 491 Cardiac catheterization showed moderate two-vessel CAD and medical management was recommended.  Patient was severely hypertensive with no recurrent chest pains.  The second OM is too small for PCI.  If he has refractory angina despite optimal medical therapy could consider PCI of OM1.  During admission he was ultimately started on atorvastatin , zetia , Imdur , amlodipine , and metoprolol .  No PTA antihypertensives or statin lowering therapy  Echocardiogram 10/2023 with LVEF 60 to 65%, no RWMA Right radial cath site healing appropriately without pain, swelling or hematoma He denies any bleeding concerns - Today patient reports his chest pains and dyspnea have resolved.  He denies any anginal or exertional symptoms. Denies headache or syncope.  Appears euvolemic.  No indication for further ischemic evaluation at this time - Since discharge he notes lightheadedness, dizziness, and fatigue that is worse in the mornings with 1 episode of N/V - Home BP average of 130s over 80s and stable blood sugars - He reports he takes all of his new medications in the morning.  I will decrease his isosorbide  from 60 to 30 mg daily and have him take this at night.  Can consider discontinuing if symptoms continue - Continue DAPT with aspirin  81 mg and Effient  10 mg daily x 1 year.  Then consider switching to Plavix alone -  Continue amlodipine  10 mg daily, ezetimibe  10 mg daily, Imdur  30 mg daily, metoprolol  XL 50 mg daily, rosuvastatin 20 mg daily  Hypertension Blood pressure today is 120/74 and well-controlled Home blood pressure under better control and averaging in the 130s He was severely hypertensive during admission and not on antihypertensives prior to recent admission - Continue amlodipine  10 mg daily, isosorbide  30 mg daily, and metoprolol  XL 50 mg daily  Hyperlipidemia, LDL goal <55 Lipoprotein (A) 112.8 LDL 104 on 10/2023 and not well-controlled He did experience myalgias on atorvastatin  and PCP switched him to rosuvastatin and since he has been without further muscle pains - Continue rosuvastatin 20 mg daily - Plan to repeat lipid panel at follow-up visit  CKD stage IIIb History of ESRD s/p renal transplant in 2003 Cr baseline seems to be around 1.8-1.9 - Most recent creatinine 2.09 on 7/22 - Has been managed by transplant team at [facility] - BMET today  History of Hashimoto thyroiditis with hypothyroidism - Managed on levothyroxine  75 mcg daily      Dispo:  Return in about 1 month (around 12/25/2023).  Signed, Lum LITTIE Louis, NP

## 2023-11-24 NOTE — Patient Instructions (Addendum)
 Medication Instructions:  DECREASE YOUR IMDUR  TO 30 MG DAILY.  Lab Work: BMET TO BE DONE TODAY.   Testing/Procedures: NONE  Follow-Up: At [facility], you and your health needs are our priority.  As part of our continuing mission to provide you with exceptional heart care, our providers are all part of one team.  This team includes your primary Cardiologist (physician) and Advanced Practice Providers or APPs (Physician Assistants and Nurse Practitioners) who all work together to provide you with the care you need, when you need it.  Your next appointment:   January 17, 2024 AT 3:20  Provider:   Gordy Bergamo, MD      Other Instructions PATIENT IS CLEARED TO RESUME ALL PHYSICAL ACTIVITY WITHOUT ANY RESTRICTIONS.

## 2023-11-25 ENCOUNTER — Ambulatory Visit: Payer: Self-pay | Admitting: Emergency Medicine

## 2023-11-25 LAB — BASIC METABOLIC PANEL WITH GFR
BUN/Creatinine Ratio: 15 (ref 9–20)
BUN: 37 mg/dL — ABNORMAL HIGH (ref 6–24)
CO2: 24 mmol/L (ref 20–29)
Calcium: 9.4 mg/dL (ref 8.7–10.2)
Chloride: 103 mmol/L (ref 96–106)
Creatinine, Ser: 2.43 mg/dL — ABNORMAL HIGH (ref 0.76–1.27)
Glucose: 265 mg/dL — ABNORMAL HIGH (ref 70–99)
Potassium: 5.5 mmol/L — ABNORMAL HIGH (ref 3.5–5.2)
Sodium: 139 mmol/L (ref 134–144)
eGFR: 32 mL/min/1.73 — ABNORMAL LOW (ref >59)

## 2023-11-30 ENCOUNTER — Other Ambulatory Visit: Payer: Self-pay

## 2023-11-30 ENCOUNTER — Telehealth: Payer: Self-pay

## 2023-11-30 DIAGNOSIS — R112 Nausea with vomiting, unspecified: Secondary | ICD-10-CM

## 2023-11-30 DIAGNOSIS — Z79899 Other long term (current) drug therapy: Secondary | ICD-10-CM

## 2023-11-30 DIAGNOSIS — N179 Acute kidney failure, unspecified: Secondary | ICD-10-CM

## 2023-11-30 DIAGNOSIS — E875 Hyperkalemia: Secondary | ICD-10-CM

## 2023-11-30 NOTE — Addendum Note (Signed)
 Addended by: JANIT GENI CROME on: 11/30/2023 02:20 PM   Modules accepted: Orders

## 2023-11-30 NOTE — Telephone Encounter (Signed)
 Patient writing in on Mychart for the second time since 11/24/23 visit with M. Fountain NP with c/o dizziness, lightheadedness.   Call to patient who reports he is home from work today with nausea and vomiting. He reports a BP of 133/75 and HR 70. He reports he did talk to his nephrologist as discussed with K. West NP on 11/27/23 and his nephrologist advising nothing other than to keep his scheduled in office appt and labs scheduled for 12/11/23. He reports he forgot to discuss whether to stay on farxiga  with his nephrologist as advised by K. Chad, NP and has been taking it daily since 11/15/23.  K. Chad advises that patient restart Imdur  30 mg daily, which patient had stopped briefly as he thought the imdur  might be causing his symptoms. Patient states because his symptoms did not improve he is willing to restart it.  Verified Metoprol is new since July admit. When asked if he had similar symptoms when he has been on nifedipine in the past, patient confirms he did experiences palpitations, lightheadedness and dizziness.   After reviewing patient responses with K. West NP, patient advised to stop amlodipine  and restart imdur  30 mg daily. He is advised to temporarily stop farxiga  and complete a BMET at any [employer]. He was asked to take his BP BID until he is seen by DOD on 12/05/23. Patient verbalizes understanding and agrees to plan. He was also advised that if his symptoms do not rapidly improve within 24 hours he needs to present to ED, patient verbalizes understanding.

## 2023-12-02 LAB — BASIC METABOLIC PANEL WITH GFR
BUN/Creatinine Ratio: 15 (ref 9–20)
BUN: 35 mg/dL — ABNORMAL HIGH (ref 6–24)
CO2: 21 mmol/L (ref 20–29)
Calcium: 8.8 mg/dL (ref 8.7–10.2)
Chloride: 103 mmol/L (ref 96–106)
Creatinine, Ser: 2.34 mg/dL — ABNORMAL HIGH (ref 0.76–1.27)
Glucose: 204 mg/dL — ABNORMAL HIGH (ref 70–99)
Potassium: 4.5 mmol/L (ref 3.5–5.2)
Sodium: 139 mmol/L (ref 134–144)
eGFR: 33 mL/min/1.73 — ABNORMAL LOW (ref >59)

## 2023-12-04 ENCOUNTER — Ambulatory Visit: Payer: Self-pay | Admitting: Cardiology

## 2023-12-04 NOTE — Progress Notes (Signed)
 Cardiology Office Note:  .   Date:  12/05/2023  ID:  Daniel Hill, DOB September 28, 1975, MRN [id number] PCP: Thurmond Cathlyn LABOR., MD  [facility] Providers Cardiologist:  Gordy Bergamo, MD Electrophysiologist:  OLE ONEIDA HOLTS, MD   History of Present Illness: .   Daniel Hill is a 48 y.o. . 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 He is being seen for follow-up of coronary artery disease and NSTEMI when he presented to the hospital in July 2025.  He had called our office due to dizziness and low blood pressure and hence Imdur  was reduced from 60 mg to 30 mg daily and amlodipine  was discontinued.  On his prior office visit his blood pressure was very well-controlled.  He still continues to have some exertional chest discomfort easily relieved with rest.  Cardiac Studies relevent.    ECHOCARDIOGRAM COMPLETE 11/11/2023  Left ventricular ejection fraction, by estimation, is 60 to 65%. The left ventricle has normal function. The left ventricle has no regional wall motion abnormalities. Left ventricular diastolic parameters were normal   Coronary angiogram 11/13/2023:   Plan: recommend medical therapy. Patient is severely hypertensive. Reports no recurrent chest pain over last 3 days. The second OM is too small for PCI. If he has refractory angina despite optimal medical therapy could consider PCI of OM1    Discussed the use of AI scribe software for clinical note transcription with the patient, who gave verbal consent to proceed.  History of Present Illness Daniel Hill is a 48 year old male with  hypertension and coronary artery disease who presents with elevated blood pressure and dizziness after discontinuation of amlodipine .  Blood pressure has been increasing since stopping amlodipine , particularly at night and in the morning. He maintains a diary of blood pressure readings and seeks alternative medication for hypertension management. Dizziness has mostly resolved after discontinuing amlodipine  but persists upon standing due to orthostatic hypotension. Blood pressure is well-controlled when lying down.  He experiences leg swelling that worsens during the day when sitting and resolves by morning. No chest pain is present, but there are occasional muscle aches and tightness during exercise, which resolve upon stopping. He has increased energy and exercise capacity after stopping certain medications and has rescheduled cardiac rehab. He uses a walking pad at home for exercise.  Current medications include isosorbide  mononitrate 30 mg daily, with Farxiga  on hold post-hospitalization. He uses CPAP nightly and is compliant. He has a history of myocardial infarction and is concerned about muscle aches and the risk of another heart attack. He uses a reclining bed to assist with his condition.   Labs   Lab Results  Component Value Date   CHOL 165 11/11/2023   HDL 54 11/11/2023   LDLCALC 104 (H) 11/11/2023   TRIG 35 11/11/2023   CHOLHDL 3.1 11/11/2023   Lipoprotein (a)  Date/Time Value Ref Range Status  11/14/2023 02:40 AM 112.8 (H) <75.0 nmol/L Final    Comment:    (NOTE) This test was developed and its performance characteristics determined by [facility]. It has not been cleared or approved by the Food and Drug Administration. Note:  Values greater than or equal to 75.0 nmol/L may  indicate an independent risk factor for CHD,       but must be evaluated with caution when applied       to non-Caucasian populations due to the       influence of genetic factors on Lp(a) across        ethnicities. Performed At: [facility] [address] [place], [place] [id number] Jennette Shorter MD Ey:[id number]     Recent Labs    11/13/23 0244 11/13/23 1441 11/14/23 0240 11/24/23 1520 12/01/23 1318  NA 140  --  138 139 139  K 4.4  --  4.7 5.5* 4.5  CL 116*  --  116* 103 103  CO2 18*  --  19* 24 21  GLUCOSE 135*  --  142* 265* 204*  BUN 45*  --  38* 37* 35*  CREATININE 1.72* 1.68* 2.09* 2.43* 2.34*  CALCIUM  8.8*  --  9.1 9.4 8.8  GFRNONAA 48* 50* 38*  --   --     Lab Results  Component Value Date   ALT 20 11/12/2023   AST 18 11/12/2023   ALKPHOS 74 11/12/2023   BILITOT 0.4 11/12/2023      Latest Ref Rng & Units 11/14/2023    2:40 AM 11/13/2023    2:41 PM 11/13/2023    2:44 AM  CBC  WBC 4.0 - 10.5 K/uL 7.4  6.6  7.1   Hemoglobin 13.0 - 17.0 g/dL 9.0  89.9  9.7   Hematocrit 39.0 - 52.0 % 27.4  30.3  29.6   Platelets 150 - 400 K/uL 266  286  297    Lab Results  Component Value Date   HGBA1C 6.4 (H) 11/11/2023    Lab Results  Component Value Date   TSH 3.660 07/21/2020     ROS  Review of Systems  Cardiovascular:  Positive for chest pain and leg swelling (chronic). Negative for dyspnea on exertion.  Neurological:  Positive for dizziness.   Physical Exam:   VS:  BP (!) 160/100 (BP Location: Right Arm, Patient Position: Sitting, Cuff Size: Normal)   Pulse 65   Resp 16   Ht 5' 9 (1.753 m)   Wt 212 lb 12.8 oz (96.5 kg)   SpO2 97%   BMI 31.43 kg/m    Wt Readings from Last 3 Encounters:  12/05/23 212 lb 12.8 oz (96.5 kg)  11/24/23 204 lb (92.5 kg)  11/13/23 210 lb (95.3 kg)    BP Readings from Last 3 Encounters:  12/05/23 (!) 160/100  11/24/23 120/74  11/14/23 (!) 161/82   Orthostatic VS for the past 24 hrs (Last 3 readings):  BP- Lying Pulse- Lying BP- Sitting Pulse- Sitting BP- Standing at 0 minutes Pulse- Standing at 0 minutes  12/05/23 1703 (!) 183/98 60 (!) 179/97 61 163/90 63    Physical Exam Neck:     Vascular: No carotid bruit  or JVD.  Cardiovascular:     Rate and Rhythm: Normal rate and regular rhythm.     Pulses:          Dorsalis pedis pulses are 0 on the right side and 0 on the left side.       Posterior tibial pulses are 0 on the right side and 0 on the left side.     Heart sounds: Normal heart sounds. No murmur heard.    No gallop.     Comments: Capillary refill time < 3 Sec Pulmonary:     Effort: Pulmonary effort is normal.  Breath sounds: Normal breath sounds.  Abdominal:     General: Bowel sounds are normal.     Palpations: Abdomen is soft.  Musculoskeletal:     Right lower leg: Edema (2+ Pitting below knee edema) present.     Left lower leg: Edema (2+ Pitting below knee edema) present.    EKG:       11/13/2023: Normal sinus rhythm, minimal nonspecific ST depressions in anterior leads unchanged from prior EKG.   ASSESSMENT AND PLAN: .      ICD-10-CM   1. Coronary artery disease of native artery of native heart with stable angina pectoris (HCC)  I25.118     2. Primary hypertension  I10 amLODipine  (NORVASC ) 5 MG tablet    3. Orthostatic hypotension  I95.1     4. Hyperlipidemia LDL goal <55  E78.5     5. Type 1 diabetes mellitus with stage 3b chronic kidney disease (HCC)  E10.22 FARXIGA  10 MG TABS tablet   N18.32      Assessment & Plan Orthostatic hypotension and supine hypertension Orthostatic hypotension likely due to autonomic neuropathy from diabetes, with significant blood pressure drop upon standing. Supine hypertension present. Discussed balance between treating supine hypertension and managing orthostatic hypotension, and the impact of diabetes on autonomic nerves. Potential for further blood pressure drop with reintroduction of Farxiga  and amlodipine . - Start amlodipine  5 mg at night patient previously on 10 mg of amlodipine  and was discontinued due to dizziness.  Patient will monitor his blood pressure. - Advise wearing support stockings with 40 mmHg pressure - Monitor blood  pressure both supine and standing - Advise sleeping slightly reclined - Patient dropped his blood pre by 20 mmHg on orthostatic check today.  Coronary artery disease with history of myocardial infarction Patient is presently doing well with stable angina pectoris, he needs aggressive risk factor modification including control of hypertension, diabetes mellitus and lipids.  Will have a low threshold to perform repeat angiography obtuse marginal 1 is fairly large with high-grade stenosis. - Continue isosorbide  mononitrate 30 mg daily - Monitor for persistent chest tightness during exercise - Consider intervention for OM-1 artery if symptoms persist  Type 1 diabetes mellitus with stage IIIa chronic kidney disease of transplanted kidney Type 1 diabetes managed with insulin . Autonomic neuropathy contributing to orthostatic hypotension. - Continue insulin  therapy, restart Farxiga  - Discontinue sodium bicarbonate  that was started during metabolic acidosis when he was admitted to the hospital. - Needs lipid profile testing, previously did not tolerate Lipitor due to myalgias now on Crestor 20 mg daily along with ezetimibe  10 mg daily.  Will do this in the next 4 to 6 weeks.  Lower extremity edema Lower extremity edema related to fluid retention, potentially exacerbated by orthostatic hypotension. Farxiga  expected to help with fluid retention. - Advise wearing support stockings with 40 mmHg pressure - Restart Farxiga  to help with fluid retention  Obstructive sleep apnea Obstructive sleep apnea managed with CPAP, used compliantly every night. - Continue CPAP therapy   Follow up: 3 months for CAD, hypertension and hypercholesterolemia  Signed,  Gordy Bergamo, MD, [facility] 12/05/2023, 10:10 AM [facility] [address] [place], [place] [address] Phone: [phone]. Fax:  [phone]

## 2023-12-05 ENCOUNTER — Encounter: Payer: Self-pay | Admitting: Cardiology

## 2023-12-05 ENCOUNTER — Other Ambulatory Visit: Payer: Self-pay | Admitting: *Deleted

## 2023-12-05 ENCOUNTER — Ambulatory Visit: Attending: Cardiology | Admitting: Cardiology

## 2023-12-05 VITALS — BP 160/100 | HR 65 | Resp 16 | Ht 69.0 in | Wt 212.8 lb

## 2023-12-05 DIAGNOSIS — I2510 Atherosclerotic heart disease of native coronary artery without angina pectoris: Secondary | ICD-10-CM

## 2023-12-05 DIAGNOSIS — E785 Hyperlipidemia, unspecified: Secondary | ICD-10-CM

## 2023-12-05 DIAGNOSIS — I25118 Atherosclerotic heart disease of native coronary artery with other forms of angina pectoris: Secondary | ICD-10-CM | POA: Diagnosis not present

## 2023-12-05 DIAGNOSIS — I951 Orthostatic hypotension: Secondary | ICD-10-CM | POA: Diagnosis not present

## 2023-12-05 DIAGNOSIS — I10 Essential (primary) hypertension: Secondary | ICD-10-CM | POA: Diagnosis not present

## 2023-12-05 DIAGNOSIS — E1022 Type 1 diabetes mellitus with diabetic chronic kidney disease: Secondary | ICD-10-CM

## 2023-12-05 DIAGNOSIS — N1832 Chronic kidney disease, stage 3b: Secondary | ICD-10-CM

## 2023-12-05 MED ORDER — EZETIMIBE 10 MG PO TABS: 10.0000 mg | ORAL_TABLET | Freq: Every day | ORAL | 2 refills | Status: DC

## 2023-12-05 MED ORDER — FARXIGA 10 MG PO TABS: 10.0000 mg | ORAL_TABLET | Freq: Every day | ORAL | 3 refills | Status: DC

## 2023-12-05 MED ORDER — METOPROLOL SUCCINATE ER 50 MG PO TB24: 50.0000 mg | ORAL_TABLET | Freq: Every day | ORAL | 2 refills | Status: DC

## 2023-12-05 MED ORDER — AMLODIPINE BESYLATE 5 MG PO TABS: 5.0000 mg | ORAL_TABLET | Freq: Every day | ORAL | 1 refills | Status: DC

## 2023-12-05 MED ORDER — PRASUGREL HCL 10 MG PO TABS: 10.0000 mg | ORAL_TABLET | Freq: Every day | ORAL | 2 refills | Status: AC

## 2023-12-05 NOTE — Patient Instructions (Addendum)
 Medication Instructions:  Your physician has recommended you make the following change in your medication: Change amlodipine  to 5 mg by mouth daily  Resume Farxiga  10 mg by mouth daily   *If you need a refill on your cardiac medications before your next appointment, please call your pharmacy*  Lab Work: Have fasting lab work drawn in 2-3 weeks.  Lipid profile.  Can be done at any [facility] location If you have labs (blood work) drawn today and your tests are completely normal, you will receive your results only by: MyChart Message (if you have MyChart) OR A paper copy in the mail If you have any lab test that is abnormal or we need to change your treatment, we will call you to review the results.  Testing/Procedures: none  Follow-Up: At [facility], you and your health needs are our priority.  As part of our continuing mission to provide you with exceptional heart care, our providers are all part of one team.  This team includes your primary Cardiologist (physician) and Advanced Practice Providers or APPs (Physician Assistants and Nurse Practitioners) who all work together to provide you with the care you need, when you need it.  Your next appointment:  November 26 at 9 AM    Provider:   Gordy Bergamo, MD    We recommend signing up for the patient portal called MyChart.  Sign up information is provided on this After Visit Summary.  MyChart is used to connect with patients for Virtual Visits (Telemedicine).  Patients are able to view lab/test results, encounter notes, upcoming appointments, etc.  Non-urgent messages can be sent to your provider as well.   To learn more about what you can do with MyChart, go to [email or web address].   Other Instructions     You may go to any of these [facility] locations:   [facility] - [address] ([facility]) - [address] [phone] [address] - [address] (1st floor, next to  pharmacy)   [place] - [address]    [place]  - [address]    [place] - [address] - [address] [employer]  - [address] [place] - [address] ([facility])  [place]   - [address], [address]

## 2023-12-08 ENCOUNTER — Encounter: Payer: Self-pay | Admitting: Cardiology

## 2023-12-12 ENCOUNTER — Telehealth: Payer: Self-pay | Admitting: Cardiology

## 2023-12-12 NOTE — Telephone Encounter (Signed)
 Spoke with [place] at [facility] regarding a letter clearing the pt for rehab. [place] stated the pt needs a letter clearing him for rehab specifically for orthostatic hypotension. [place] stated the letter must have a doctor's signature. Letter created, printed off and left in Dr. Godfrey box. The letter can be faxed to [phone] once signature is obtained.

## 2023-12-12 NOTE — Telephone Encounter (Signed)
 Yes please, okay to resume rehab

## 2023-12-12 NOTE — Telephone Encounter (Signed)
 Calling to say patient want to just the rehab; office is saying that they need a letter stating that he is clear to do rehab. Please advise

## 2023-12-14 MED ORDER — AMLODIPINE BESYLATE 10 MG PO TABS: 10.0000 mg | ORAL_TABLET | Freq: Every day | ORAL | 1 refills | Status: DC

## 2023-12-14 MED ORDER — ISOSORBIDE MONONITRATE ER 60 MG PO TB24: 60.0000 mg | ORAL_TABLET | Freq: Every day | ORAL | 1 refills | Status: DC

## 2023-12-14 NOTE — Telephone Encounter (Signed)
Signed letter faxed.

## 2024-01-17 ENCOUNTER — Ambulatory Visit: Admitting: Cardiology

## 2024-02-04 ENCOUNTER — Encounter: Payer: Self-pay | Admitting: Cardiology

## 2024-03-03 ENCOUNTER — Other Ambulatory Visit: Payer: Self-pay

## 2024-03-03 ENCOUNTER — Inpatient Hospital Stay (HOSPITAL_COMMUNITY)
Admission: EM | Admit: 2024-03-03 | Discharge: 2024-03-06 | DRG: 699 | Disposition: A | Discharge status: Home / Self Care | Attending: Internal Medicine | Admitting: Internal Medicine

## 2024-03-03 ENCOUNTER — Emergency Department (HOSPITAL_COMMUNITY)

## 2024-03-03 DIAGNOSIS — N12 Tubulo-interstitial nephritis, not specified as acute or chronic: Secondary | ICD-10-CM | POA: Diagnosis present

## 2024-03-03 DIAGNOSIS — N1832 Chronic kidney disease, stage 3b: Secondary | ICD-10-CM | POA: Diagnosis present

## 2024-03-03 DIAGNOSIS — I25118 Atherosclerotic heart disease of native coronary artery with other forms of angina pectoris: Secondary | ICD-10-CM | POA: Diagnosis not present

## 2024-03-03 DIAGNOSIS — N179 Acute kidney failure, unspecified: Secondary | ICD-10-CM | POA: Diagnosis present

## 2024-03-03 DIAGNOSIS — F411 Generalized anxiety disorder: Secondary | ICD-10-CM | POA: Diagnosis not present

## 2024-03-03 DIAGNOSIS — Z91018 Allergy to other foods: Secondary | ICD-10-CM

## 2024-03-03 DIAGNOSIS — Z905 Acquired absence of kidney: Secondary | ICD-10-CM

## 2024-03-03 DIAGNOSIS — E1043 Type 1 diabetes mellitus with diabetic autonomic (poly)neuropathy: Secondary | ICD-10-CM | POA: Diagnosis present

## 2024-03-03 DIAGNOSIS — Y830 Surgical operation with transplant of whole organ as the cause of abnormal reaction of the patient, or of later complication, without mention of misadventure at the time of the procedure: Secondary | ICD-10-CM | POA: Diagnosis present

## 2024-03-03 DIAGNOSIS — N1330 Unspecified hydronephrosis: Secondary | ICD-10-CM | POA: Diagnosis present

## 2024-03-03 DIAGNOSIS — E669 Obesity, unspecified: Secondary | ICD-10-CM | POA: Diagnosis present

## 2024-03-03 DIAGNOSIS — T8613 Kidney transplant infection: Principal | ICD-10-CM | POA: Diagnosis present

## 2024-03-03 DIAGNOSIS — N183 Chronic kidney disease, stage 3 unspecified: Secondary | ICD-10-CM | POA: Diagnosis present

## 2024-03-03 DIAGNOSIS — Z807 Family history of other malignant neoplasms of lymphoid, hematopoietic and related tissues: Secondary | ICD-10-CM

## 2024-03-03 DIAGNOSIS — E063 Autoimmune thyroiditis: Secondary | ICD-10-CM | POA: Diagnosis present

## 2024-03-03 DIAGNOSIS — E1069 Type 1 diabetes mellitus with other specified complication: Secondary | ICD-10-CM

## 2024-03-03 DIAGNOSIS — H40001 Preglaucoma, unspecified, right eye: Secondary | ICD-10-CM | POA: Diagnosis present

## 2024-03-03 DIAGNOSIS — K3184 Gastroparesis: Secondary | ICD-10-CM | POA: Diagnosis present

## 2024-03-03 DIAGNOSIS — F330 Major depressive disorder, recurrent, mild: Secondary | ICD-10-CM | POA: Diagnosis present

## 2024-03-03 DIAGNOSIS — E1022 Type 1 diabetes mellitus with diabetic chronic kidney disease: Secondary | ICD-10-CM | POA: Diagnosis present

## 2024-03-03 DIAGNOSIS — Z885 Allergy status to narcotic agent status: Secondary | ICD-10-CM

## 2024-03-03 DIAGNOSIS — K219 Gastro-esophageal reflux disease without esophagitis: Secondary | ICD-10-CM | POA: Diagnosis present

## 2024-03-03 DIAGNOSIS — T8619 Other complication of kidney transplant: Secondary | ICD-10-CM | POA: Diagnosis present

## 2024-03-03 DIAGNOSIS — D849 Immunodeficiency, unspecified: Secondary | ICD-10-CM | POA: Diagnosis present

## 2024-03-03 DIAGNOSIS — Z9483 Pancreas transplant status: Secondary | ICD-10-CM

## 2024-03-03 DIAGNOSIS — R040 Epistaxis: Secondary | ICD-10-CM | POA: Diagnosis not present

## 2024-03-03 DIAGNOSIS — Z79899 Other long term (current) drug therapy: Secondary | ICD-10-CM

## 2024-03-03 DIAGNOSIS — I2510 Atherosclerotic heart disease of native coronary artery without angina pectoris: Secondary | ICD-10-CM | POA: Diagnosis present

## 2024-03-03 DIAGNOSIS — Z7982 Long term (current) use of aspirin: Secondary | ICD-10-CM

## 2024-03-03 DIAGNOSIS — Z888 Allergy status to other drugs, medicaments and biological substances status: Secondary | ICD-10-CM

## 2024-03-03 DIAGNOSIS — Z808 Family history of malignant neoplasm of other organs or systems: Secondary | ICD-10-CM

## 2024-03-03 DIAGNOSIS — Z940 Kidney transplant status: Secondary | ICD-10-CM

## 2024-03-03 DIAGNOSIS — E109 Type 1 diabetes mellitus without complications: Secondary | ICD-10-CM | POA: Diagnosis present

## 2024-03-03 DIAGNOSIS — R112 Nausea with vomiting, unspecified: Secondary | ICD-10-CM

## 2024-03-03 DIAGNOSIS — T86890 Other transplanted tissue rejection: Secondary | ICD-10-CM | POA: Diagnosis present

## 2024-03-03 DIAGNOSIS — Z7902 Long term (current) use of antithrombotics/antiplatelets: Secondary | ICD-10-CM

## 2024-03-03 DIAGNOSIS — T8610 Unspecified complication of kidney transplant: Principal | ICD-10-CM

## 2024-03-03 DIAGNOSIS — Z8249 Family history of ischemic heart disease and other diseases of the circulatory system: Secondary | ICD-10-CM

## 2024-03-03 DIAGNOSIS — Z833 Family history of diabetes mellitus: Secondary | ICD-10-CM

## 2024-03-03 DIAGNOSIS — Z1152 Encounter for screening for COVID-19: Secondary | ICD-10-CM

## 2024-03-03 DIAGNOSIS — I129 Hypertensive chronic kidney disease with stage 1 through stage 4 chronic kidney disease, or unspecified chronic kidney disease: Secondary | ICD-10-CM | POA: Diagnosis present

## 2024-03-03 DIAGNOSIS — Z823 Family history of stroke: Secondary | ICD-10-CM

## 2024-03-03 DIAGNOSIS — Z79890 Hormone replacement therapy (postmenopausal): Secondary | ICD-10-CM

## 2024-03-03 DIAGNOSIS — Z9641 Presence of insulin pump (external) (internal): Secondary | ICD-10-CM | POA: Diagnosis present

## 2024-03-03 DIAGNOSIS — G4733 Obstructive sleep apnea (adult) (pediatric): Secondary | ICD-10-CM | POA: Diagnosis present

## 2024-03-03 LAB — BASIC METABOLIC PANEL WITH GFR
Anion gap: 9 (ref 5–15)
BUN: 38 mg/dL — ABNORMAL HIGH (ref 6–20)
CO2: 18 mmol/L — ABNORMAL LOW (ref 22–32)
Calcium: 8.9 mg/dL (ref 8.9–10.3)
Chloride: 112 mmol/L — ABNORMAL HIGH (ref 98–111)
Creatinine, Ser: 2.38 mg/dL — ABNORMAL HIGH (ref 0.61–1.24)
GFR, Estimated: 33 mL/min — ABNORMAL LOW (ref >60)
Glucose, Bld: 217 mg/dL — ABNORMAL HIGH (ref 70–99)
Potassium: 4.4 mmol/L (ref 3.5–5.1)
Sodium: 139 mmol/L (ref 135–145)

## 2024-03-03 LAB — LIPASE, BLOOD: Lipase: 18 U/L (ref 11–51)

## 2024-03-03 LAB — CBC
HCT: 34.9 % — ABNORMAL LOW (ref 39.0–52.0)
Hemoglobin: 11.5 g/dL — ABNORMAL LOW (ref 13.0–17.0)
MCH: 30.1 pg (ref 26.0–34.0)
MCHC: 33 g/dL (ref 30.0–36.0)
MCV: 91.4 fL (ref 80.0–100.0)
Platelets: 329 K/uL (ref 150–400)
RBC: 3.82 MIL/uL — ABNORMAL LOW (ref 4.22–5.81)
RDW: 13.9 % (ref 11.5–15.5)
WBC: 17.6 K/uL — ABNORMAL HIGH (ref 4.0–10.5)
nRBC: 0 % (ref 0.0–0.2)

## 2024-03-03 LAB — RESP PANEL BY RT-PCR (RSV, FLU A&B, COVID)  RVPGX2
Influenza A by PCR: NEGATIVE
Influenza B by PCR: NEGATIVE
Resp Syncytial Virus by PCR: NEGATIVE
SARS Coronavirus 2 by RT PCR: NEGATIVE

## 2024-03-03 LAB — HEPATIC FUNCTION PANEL
ALT: 19 U/L (ref 0–44)
AST: 20 U/L (ref 15–41)
Albumin: 3 g/dL — ABNORMAL LOW (ref 3.5–5.0)
Alkaline Phosphatase: 88 U/L (ref 38–126)
Bilirubin, Direct: <0.1 mg/dL (ref 0.0–0.2)
Total Bilirubin: 0.4 mg/dL (ref 0.0–1.2)
Total Protein: 5.8 g/dL — ABNORMAL LOW (ref 6.5–8.1)

## 2024-03-03 LAB — URINALYSIS, ROUTINE W REFLEX MICROSCOPIC
Bilirubin Urine: NEGATIVE
Glucose, UA: >=500 mg/dL — AB
Ketones, ur: NEGATIVE mg/dL
Leukocytes,Ua: NEGATIVE
Nitrite: NEGATIVE
Protein, ur: >=300 mg/dL — AB
Specific Gravity, Urine: 1.011 (ref 1.005–1.030)
pH: 5 (ref 5.0–8.0)

## 2024-03-03 LAB — TROPONIN I (HIGH SENSITIVITY)
Troponin I (High Sensitivity): 7 ng/L (ref <18)
Troponin I (High Sensitivity): 9 ng/L (ref <18)

## 2024-03-03 LAB — GLUCOSE, CAPILLARY: Glucose-Capillary: 279 mg/dL — ABNORMAL HIGH (ref 70–99)

## 2024-03-03 LAB — BRAIN NATRIURETIC PEPTIDE: B Natriuretic Peptide: 434.5 pg/mL — ABNORMAL HIGH (ref 0.0–100.0)

## 2024-03-03 LAB — I-STAT CG4 LACTIC ACID, ED: Lactic Acid, Venous: 0.6 mmol/L (ref 0.5–1.9)

## 2024-03-03 MED ORDER — SCOPOLAMINE 1 MG/3DAYS TD PT72
1.0000 | MEDICATED_PATCH | Freq: Once | TRANSDERMAL | Status: AC
Start: 2024-03-03 — End: 2024-03-06
  Administered 2024-03-03: 1 mg via TRANSDERMAL
  Filled 2024-03-03: qty 1

## 2024-03-03 MED ORDER — ACETAMINOPHEN 650 MG RE SUPP
650.0000 mg | Freq: Four times a day (QID) | RECTAL | Status: DC | PRN
  Administered 2024-03-03: 650 mg via RECTAL
  Filled 2024-03-03: qty 1

## 2024-03-03 MED ORDER — ISOSORBIDE MONONITRATE ER 60 MG PO TB24
60.0000 mg | ORAL_TABLET | Freq: Every day | ORAL | Status: DC
  Administered 2024-03-04 – 2024-03-06 (×3): 60 mg via ORAL
  Filled 2024-03-03 (×3): qty 1

## 2024-03-03 MED ORDER — ASPIRIN 81 MG PO TBEC
81.0000 mg | DELAYED_RELEASE_TABLET | Freq: Every day | ORAL | Status: DC
  Administered 2024-03-04 – 2024-03-05 (×2): 81 mg via ORAL
  Filled 2024-03-03 (×2): qty 1

## 2024-03-03 MED ORDER — TACROLIMUS 1 MG PO CAPS
1.0000 mg | ORAL_CAPSULE | Freq: Every day | ORAL | Status: DC
  Administered 2024-03-03 – 2024-03-05 (×3): 1 mg via ORAL
  Filled 2024-03-03 (×4): qty 1

## 2024-03-03 MED ORDER — AMLODIPINE BESYLATE 10 MG PO TABS
10.0000 mg | ORAL_TABLET | Freq: Every day | ORAL | Status: DC
  Administered 2024-03-04 – 2024-03-06 (×3): 10 mg via ORAL
  Filled 2024-03-03 (×3): qty 1

## 2024-03-03 MED ORDER — METOPROLOL SUCCINATE ER 50 MG PO TB24
50.0000 mg | ORAL_TABLET | Freq: Every day | ORAL | Status: DC
  Administered 2024-03-04 – 2024-03-06 (×3): 50 mg via ORAL
  Filled 2024-03-03 (×3): qty 1

## 2024-03-03 MED ORDER — INSULIN PUMP
Freq: Three times a day (TID) | SUBCUTANEOUS | Status: DC
  Administered 2024-03-03: 4.65 via SUBCUTANEOUS
  Administered 2024-03-04: 1.5 via SUBCUTANEOUS
  Filled 2024-03-03: qty 1

## 2024-03-03 MED ORDER — HEPARIN SODIUM (PORCINE) 5000 UNIT/ML IJ SOLN
5000.0000 [IU] | Freq: Three times a day (TID) | INTRAMUSCULAR | Status: DC
  Administered 2024-03-03 – 2024-03-05 (×5): 5000 [IU] via SUBCUTANEOUS
  Filled 2024-03-03 (×6): qty 1

## 2024-03-03 MED ORDER — LACTATED RINGERS IV BOLUS
1000.0000 mL | Freq: Once | INTRAVENOUS | Status: AC
  Administered 2024-03-03: 1000 mL via INTRAVENOUS

## 2024-03-03 MED ORDER — ACETAMINOPHEN 325 MG PO TABS: 650.0000 mg | ORAL_TABLET | Freq: Four times a day (QID) | ORAL | Status: DC | PRN

## 2024-03-03 MED ORDER — TRAMADOL HCL 50 MG PO TABS: 50.0000 mg | ORAL_TABLET | Freq: Four times a day (QID) | ORAL | Status: DC | PRN

## 2024-03-03 MED ORDER — LABETALOL HCL 5 MG/ML IV SOLN: 5.0000 mg | INTRAVENOUS | Status: DC | PRN

## 2024-03-03 MED ORDER — TACROLIMUS 1 MG PO CAPS
2.0000 mg | ORAL_CAPSULE | Freq: Every morning | ORAL | Status: DC
  Administered 2024-03-04 – 2024-03-06 (×3): 2 mg via ORAL
  Filled 2024-03-03 (×4): qty 2

## 2024-03-03 MED ORDER — SODIUM CHLORIDE 0.9% FLUSH
3.0000 mL | Freq: Two times a day (BID) | INTRAVENOUS | Status: DC
  Administered 2024-03-03 – 2024-03-06 (×4): 3 mL via INTRAVENOUS

## 2024-03-03 MED ORDER — LACTATED RINGERS IV BOLUS: 1000.0000 mL | Freq: Once | INTRAVENOUS | Status: DC

## 2024-03-03 MED ORDER — PANTOPRAZOLE SODIUM 40 MG PO TBEC
40.0000 mg | DELAYED_RELEASE_TABLET | Freq: Every day | ORAL | Status: DC
  Filled 2024-03-03 (×3): qty 1

## 2024-03-03 MED ORDER — LEVOTHYROXINE SODIUM 75 MCG PO TABS
75.0000 ug | ORAL_TABLET | Freq: Every day | ORAL | Status: DC
  Administered 2024-03-04 – 2024-03-06 (×3): 75 ug via ORAL
  Filled 2024-03-03 (×3): qty 1

## 2024-03-03 MED ORDER — ONDANSETRON HCL 4 MG/2ML IJ SOLN
4.0000 mg | Freq: Once | INTRAMUSCULAR | Status: AC
  Administered 2024-03-03: 4 mg via INTRAVENOUS
  Filled 2024-03-03: qty 2

## 2024-03-03 MED ORDER — PIPERACILLIN-TAZOBACTAM 3.375 G IVPB
3.3750 g | Freq: Three times a day (TID) | INTRAVENOUS | Status: DC
  Administered 2024-03-03 – 2024-03-05 (×7): 3.375 g via INTRAVENOUS
  Filled 2024-03-03 (×9): qty 50

## 2024-03-03 MED ORDER — SODIUM CHLORIDE 0.9 % IV SOLN
25.0000 mg | Freq: Four times a day (QID) | INTRAVENOUS | Status: DC | PRN
  Administered 2024-03-03 – 2024-03-04 (×2): 25 mg via INTRAVENOUS
  Filled 2024-03-03 (×3): qty 1

## 2024-03-03 MED ORDER — PRASUGREL HCL 10 MG PO TABS
10.0000 mg | ORAL_TABLET | Freq: Every day | ORAL | Status: DC
  Administered 2024-03-04 – 2024-03-06 (×3): 10 mg via ORAL
  Filled 2024-03-03 (×3): qty 1

## 2024-03-03 MED ORDER — PIPERACILLIN-TAZOBACTAM 3.375 G IVPB 30 MIN
3.3750 g | Freq: Once | INTRAVENOUS | Status: AC
  Administered 2024-03-03: 3.375 g via INTRAVENOUS
  Filled 2024-03-03: qty 50

## 2024-03-03 MED ORDER — POLYETHYLENE GLYCOL 3350 17 G PO PACK: 17.0000 g | PACK | Freq: Every day | ORAL | Status: DC | PRN

## 2024-03-03 NOTE — ED Triage Notes (Signed)
 Pt biba, [place]. Pt coming from home with n/v and chest pain that started this morning. Pt took 1 sl nitroglycerin , asa 324., phenergan  , 1G apap, 4 mg zofran  pta.   Pt has a hx of dialysis, dm, stent placement, NSTEMI. Pt temp 100.3 f hr 88 spo2 97 bp 164/80 cbg 226. 18  R AC. Fistula L Arm. Hx of kidney and pancreas transplant.

## 2024-03-03 NOTE — ED Notes (Signed)
 CCMD called to place pt on tele monitor.

## 2024-03-03 NOTE — ED Provider Notes (Signed)
 [name] [facility]MENT AT [facility] Provider Note   CSN: [id number] Arrival date & time: 03/03/24  9096     Patient presents with: Chest Pain   Daniel Hill is a 48 y.o. male.   48 year old male presenting with chest pain, nausea/vomiting.  Patient with history of gastroparesis and frequent episodes of vomiting, began to have nausea/vomiting overnight and then subsequently developed a burning sensation in his upper abdomen/chest which he initially attributed to GERD, however his symptoms progressively worsened with continued burning in the upper abdomen/left chest with pain in his shoulder as well.  His wife administered 1 aspirin  and 1 sublingual nitroglycerin  at home and he reports that his symptoms resolved in about 10 minutes, he was also given additional aspirin /Tylenol /antiemetic en route via EMS.  His wife also notes that he felt warm at home, no known fever but did have a temp of 100.27F en route with EMS. He is pain/nausea free currently.  History of NSTEMI in July, underwent heart catheterization but no stents were placed, he is on aspirin  and Plavix.  History of renal/pancreatic transplant in 2003, he is not on dialysis. Denies abdominal pain, shortness of breath.  Endorses persistent bilateral lower extremity edema that has been worse since his NSTEMI in July according to his wife, reports that the edema does improve with elevation/use of compression socks.   Chest Pain      Prior to Admission medications   Medication Sig Start Date End Date Taking? Authorizing Provider  amLODipine  (NORVASC ) 10 MG tablet Take 1 tablet (10 mg total) by mouth daily. 12/14/23   Ladona Heinz, MD  aspirin  EC 81 MG tablet Take 1 tablet (81 mg total) by mouth daily. Swallow whole. 11/15/23   Amin, Ankit C, MD  busPIRone  (BUSPAR ) 10 MG tablet Take 10 mg by mouth 2 (two) times daily. 09/25/23   [provider]  cholecalciferol (VITAMIN D3) 25 MCG (1000 UNIT) tablet Take  1,000 Units by mouth daily.    [provider]  Continuous Glucose Sensor (DEXCOM G6 SENSOR) MISC Use as instructed to check blood sugar. Change every 10 days 05/24/23   Trixie File, MD  Continuous Glucose Transmitter (DEXCOM G6 TRANSMITTER) MISC Use as instructed. Change every 90 days 05/24/23   Trixie File, MD  esomeprazole  (NEXIUM ) 40 MG capsule Take 40 mg by mouth 2 (two) times daily.    [provider]  ezetimibe  (ZETIA ) 10 MG tablet Take 1 tablet (10 mg total) by mouth daily. 12/05/23   Ladona Heinz, MD  FARXIGA  10 MG TABS tablet Take 1 tablet (10 mg total) by mouth daily. 12/05/23   Ladona Heinz, MD  fluticasone (FLONASE) 50 MCG/ACT nasal spray Place 1 spray into both nostrils daily. 06/12/23   [provider]  furosemide  (LASIX ) 20 MG tablet Take 20 mg by mouth daily as needed for edema (for legs).    [provider]  Glucagon  (BAQSIMI  TWO PACK) 3 MG/DOSE POWD Place 3 mg into the nose once as needed for up to 1 dose. 03/16/20   Trixie File, MD  Insulin  Aspart, w/Niacinamide, (FIASP ) 100 UNIT/ML SOLN INJECT UP TO 50 UNITS UNDER THE SKIN VIA INSULIN  PUMP DAILY 11/14/23   Trixie File, MD  Insulin  Disposable Pump (OMNIPOD 5 DEXG7G6 PODS GEN 5) MISC Use 1 pod every 3 days 05/24/23   Trixie File, MD  isosorbide  mononitrate (IMDUR ) 60 MG 24 hr tablet Take 1 tablet (60 mg total) by mouth daily. 12/14/23   Ladona Heinz, MD  levothyroxine  (SYNTHROID ) 75 MCG tablet TAKE 1 TABLET(75 MCG) BY MOUTH DAILY 02/11/22   Trixie File, MD  METHYLENE BLUE, BULK-SOLID, POWD Take 1 Scoop by mouth daily. Mix with a beverage and drink    [provider]  metoprolol  succinate (TOPROL -XL) 50 MG 24 hr tablet Take 1 tablet (50 mg total) by mouth daily. Take with or immediately following a meal. 12/05/23   Ladona Heinz, MD  Multiple Vitamins-Minerals (MULTIVITAMINS THER. W/MINERALS) TABS Take 1 tablet by mouth daily.    [provider]  nitroGLYCERIN   (NITROSTAT ) 0.4 MG SL tablet Place 1 tablet (0.4 mg total) under the tongue every 5 (five) minutes as needed for chest pain. 11/14/23 11/13/24  Caleen Burgess BROCKS, MD  prasugrel  (EFFIENT ) 10 MG TABS tablet Take 1 tablet (10 mg total) by mouth daily. 12/05/23   Ladona Heinz, MD  promethazine  (PHENERGAN ) 25 MG tablet Take 25 mg by mouth every 6 (six) hours as needed.    [provider]  rosuvastatin (CRESTOR) 20 MG tablet Take 20 mg by mouth daily. 11/15/23   [provider]  SYRINGE/NEEDLE, DISP, 1 ML 23G X 1 1 ML MISC Use every week 07/05/19   Trixie File, MD  tacrolimus  (PROGRAF ) 1 MG capsule Take 1-2 mg by mouth See admin instructions. Take 2 capsules by mouth in the morning and 1 capsule at night    [provider]  traMADol (ULTRAM) 50 MG tablet Take 50-100 mg by mouth every 6 (six) hours as needed for pain. 05/16/17   [provider]  TUBERCULIN SYR 1CC/25GX5/8 (B-D TB SYRINGE 1CC/25GX5/8) 25G X 5/8 1 ML MISC USE 1 EVERY 2 WEEKS 03/31/20   Trixie File, MD  VELTASSA 8.4 g packet Take 8.4 g by mouth every other day.    [provider]    Allergies: Tomato, Other, Codeine, and Metoclopramide hcl    Review of Systems  Cardiovascular:  Positive for chest pain.    Updated Vital Signs  Vitals:   03/03/24 1002 03/03/24 1102 03/03/24 1400 03/03/24 1500  BP:  (!) 166/92 (!) 170/95 (!) 179/87  Pulse:  89 (!) 103 (!) 107  Resp:  11 12 10   Temp:  99.1 F (37.3 C)  99.4 F (37.4 C)  TempSrc:  Oral  Oral  SpO2: 96% 99% 98% 94%     Physical Exam Vitals and nursing note reviewed.  Constitutional:      General: He is not in acute distress. HENT:     Head: Normocephalic.  Eyes:     Extraocular Movements: Extraocular movements intact.  Cardiovascular:     Rate and Rhythm: Normal rate and regular rhythm.     Heart sounds: Normal heart sounds.  Pulmonary:     Effort: Pulmonary effort is normal.     Breath sounds: Normal breath sounds.   Abdominal:     Palpations: Abdomen is soft.     Tenderness: There is no abdominal tenderness. There is no guarding.  Musculoskeletal:     Cervical back: Normal range of motion.     Right lower leg: Edema present.     Left lower leg: Edema present.     Comments: Moves all extremity spontaneously without difficulty  Skin:    General: Skin is warm and dry.     Coloration: Skin is pale.  Neurological:     Mental Status: He is alert and oriented to person, place, and time.     (all labs ordered are listed, but only abnormal results are  displayed) Labs Reviewed  BASIC METABOLIC PANEL WITH GFR - Abnormal; Notable for the following components:      Result Value   Chloride 112 (*)    CO2 18 (*)    Glucose, Bld 217 (*)    BUN 38 (*)    Creatinine, Ser 2.38 (*)    GFR, Estimated 33 (*)    All other components within normal limits  CBC - Abnormal; Notable for the following components:   WBC 17.6 (*)    RBC 3.82 (*)    Hemoglobin 11.5 (*)    HCT 34.9 (*)    All other components within normal limits  URINALYSIS, ROUTINE W REFLEX MICROSCOPIC - Abnormal; Notable for the following components:   Glucose, UA >=500 (*)    Hgb urine dipstick SMALL (*)    Protein, ur >=300 (*)    Bacteria, UA RARE (*)    All other components within normal limits  HEPATIC FUNCTION PANEL - Abnormal; Notable for the following components:   Total Protein 5.8 (*)    Albumin 3.0 (*)    All other components within normal limits  BRAIN NATRIURETIC PEPTIDE - Abnormal; Notable for the following components:   B Natriuretic Peptide 434.5 (*)    All other components within normal limits  RESP PANEL BY RT-PCR (RSV, FLU A&B, COVID)  RVPGX2  CULTURE, BLOOD (ROUTINE X 2)  CULTURE, BLOOD (ROUTINE X 2)  LIPASE, BLOOD  I-STAT CG4 LACTIC ACID, ED  I-STAT CG4 LACTIC ACID, ED  I-STAT CG4 LACTIC ACID, ED  I-STAT CG4 LACTIC ACID, ED  TROPONIN I (HIGH SENSITIVITY)  TROPONIN I (HIGH SENSITIVITY)     EKG: None  Radiology: CT CHEST ABDOMEN PELVIS WO CONTRAST Result Date: 03/03/2024 EXAM: CT CHEST, ABDOMEN AND PELVIS WITHOUT CONTRAST 03/03/2024 11:45:17 AM TECHNIQUE: CT of the chest, abdomen and pelvis was performed without the administration of intravenous contrast. Multiplanar reformatted images are provided for review. Automated exposure control, iterative reconstruction, and/or weight based adjustment of the mA/kV was utilized to reduce the radiation dose to as low as reasonably achievable. COMPARISON: 06/27/2016 CLINICAL HISTORY: low grade fever, elevated WBC count, nausea/vomiting/chest pain, h/o pancreas / kidney transplant FINDINGS: CHEST: MEDIASTINUM AND LYMPH NODES: Heart and pericardium are unremarkable. Coronary artery calcifications. The central airways are clear. No mediastinal, hilar or axillary lymphadenopathy. LUNGS AND PLEURA: Mild scarring within the posterior right lung base. No focal consolidation or pulmonary edema. No pleural effusion or pneumothorax. No signs of interstitial edema. ABDOMEN AND PELVIS: LIVER: The liver is unremarkable. GALLBLADDER AND BILE DUCTS: Gallbladder is unremarkable. No biliary ductal dilatation. SPLEEN: The spleen is within normal limits in size and appearance. PANCREAS: The pancreas appears diffusely atrophic. No main duct dilatation, inflammation, or mass. ADRENAL GLANDS: Normal size and morphology bilaterally. No nodule, thickening, or hemorrhage. No periadrenal stranding. KIDNEYS, URETERS AND BLADDER: Status post bilateral nephrectomy. Left lower quadrant renal graft with unchanged mild hydronephrosis but new perinephric soft tissue stranding. Mild stranding around the dome of the bladder is also noted. No stones in the kidneys or ureters. GI AND BOWEL: Stomach demonstrates no acute abnormality. No signs of bowel inflammation or distention. Moderate stool burden identified within the cecum. There is no bowel obstruction. REPRODUCTIVE ORGANS: The  prostate gland appears normal. PERITONEUM AND RETROPERITONEUM: No ascites. No free air. VASCULATURE: Aorta is normal in caliber. ABDOMINAL AND PELVIS LYMPH NODES: Prominent mesenteric and retroperitoneal lymph nodes which appears stable from the prior exam. Left index periaortic lymph node is stable measuring 1  cm, image 76/3. BONES AND SOFT TISSUES: Multilevel thoracic degenerative disc disease. No acute or suspicious osseous abnormality. No focal soft tissue abnormality. IMPRESSION: 1. Left lower quadrant renal graft with unchanged mild hydronephrosis and new perinephric soft tissue stranding. Correlate for any clinical signs or symptoms of renal graft infection 2. Mild stranding around the dome of the bladder, which may reflect underlying cystitis. Electronically signed by: Waddell Calk MD 03/03/2024 12:18 PM EST RP Workstation: [id number]   DG Chest 2 View Result Date: 03/03/2024 EXAM: 2 VIEW(S) XRAY OF THE CHEST 03/03/2024 10:19:45 AM COMPARISON: 11/11/2023 CLINICAL HISTORY: chest pain FINDINGS: LUNGS AND PLEURA: No focal pulmonary opacity. No pulmonary edema. No pleural effusion. No pneumothorax. HEART AND MEDIASTINUM: No acute abnormality of the cardiac and mediastinal silhouettes. BONES AND SOFT TISSUES: No acute osseous abnormality. IMPRESSION: 1. No acute cardiopulmonary process. Electronically signed by: Waddell Calk MD 03/03/2024 10:37 AM EST RP Workstation: [id number]     Procedures   Medications Ordered in the ED  promethazine  (PHENERGAN ) 25 mg in sodium chloride  0.9 % 50 mL IVPB (0 mg Intravenous Stopped 03/03/24 1515)  piperacillin-tazobactam (ZOSYN) IVPB 3.375 g (0 g Intravenous Stopped 03/03/24 1515)    Followed by  piperacillin-tazobactam (ZOSYN) IVPB 3.375 g (has no administration in time range)  ondansetron  (ZOFRAN ) injection 4 mg (4 mg Intravenous Given 03/03/24 1214)  lactated ringers  bolus 1,000 mL (1,000 mLs Intravenous New Bag/Given 03/03/24 1425)                                     Medical Decision Making This patient presents to the ED for concern of chest pain/nausea/vomiting, this involves an extensive number of treatment options, and is a complaint that carries with it a high risk of complications and morbidity.  The differential diagnosis includes ACS, pneumonia, PE, COVID/flu/RSV, transplant complication,   Co morbidities that complicate the patient evaluation  NSTEMI, history of pancreas and renal transplant   Additional history obtained:  Additional history obtained from record review External records from outside source obtained and reviewed including notes from prior hospitalization for NSTEMI   Lab Tests:  I Ordered, and personally interpreted labs.  The pertinent results include: CBC notable for leukocytosis with white blood cell count of 17.6, hemoglobin of 11.5 is improved from most recent baseline.  BMP notable for creatinine of 2.38, this is largely consistent with his most recent baseline of 2.34 from about 3 months ago.  Initial troponin 7, repeat 9.  Lipase within normal limits at 18.  Lactic within normal limits at 0.6.  Urinalysis notable for glucosuria/proteinuria with small RBCs, rare bacteria, low suspicion for urinary tract infection.  BNP elevated at 434.5, up from most recent baseline of 113.2 from 3 months ago.   Imaging Studies ordered:  I ordered imaging studies including CXR, CT chest/abdomen/pelvis I independently visualized and interpreted imaging which showed  - CXR: 1. No acute cardiopulmonary process. - CT chest/abdomen/pelvis: 1. Left lower quadrant renal graft with unchanged mild hydronephrosis and new perinephric soft tissue stranding. Correlate for any clinical signs or symptoms of renal graft infection 2. Mild stranding around the dome of the bladder, which may reflect underlying cystitis.  I agree with the radiologist interpretation   Cardiac Monitoring: / EKG:  The patient was maintained on a cardiac monitor.   I personally viewed and interpreted the cardiac monitored which showed an underlying rhythm of: NSR   Consultations Obtained:  I requested consultation with the nephrology and hospitalist,  and discussed lab and imaging findings as well as pertinent plan - they recommend: I spoke with Dr. Seena with the hospitalist service, he requests that I speak with the nephrologist to determine whether or not this patient needs to be admitted at a transplant facility given his posttransplant status.  I spoke with Dr. Geralynn with nephrology who feels that this patient is appropriate for admission to the hospitalist service here, it appears that patient is only on tacrolimus  in terms of immunosuppressants but advises that we double check to ensure that he is not on any others, he can continue tacrolimus  during his hospitalization.  I discussed this information with Dr. Seena and he is in agreement with this plan.   Problem List / ED Course / Critical interventions / Medication management  I ordered medication including Zofran  for nausea, Zosyn for suspected renal graft infection, LR fluid bolus for rehydration, Phenergan  for nausea Reevaluation of the patient after these medicines showed that the patient improved I have reviewed the patients home medicines and have made adjustments as needed   Test / Admission - Considered:  Physical exam is notable as above.  At time of my initial assessment patient is no longer complaining of chest discomfort nor is he having nausea/vomiting, concern for cardiac origin of his discomfort today given recent NSTEMI in July of this year.  Patient is on aspirin  and Plavix and is followed by cardiology.  Patient is also a transplant patient, underwent renal/pancreatic transplant in 2003, he is followed by [facility]. Cardiac workup is largely reassuring as above, EKG is without ischemic changes, troponin remains flat at 9.  Patient does have leukocytosis of 17.6  as well as a low-grade fever noted to be 100.3 by EMS, he remains borderline febrile in the emergency department today.  He is intermittently tachycardic as well.  BNP is elevated at 434.5, patient is not grossly fluid overloaded but has noted some worsening lower extremity edema over the past several months, this typically is alleviated with elevation and compression.   Given these findings I am concerned for underlying sepsis, will proceed with CT chest abdomen pelvis without contrast given patient's renal function. CT imaging is notable as above, concern for possible perinephric soft tissue stranding which may correlate with a renal graft infection.  UA is without overt signs of infection, creatinine remains largely stable from previous.  I discussed antibiotic choice with pharmacy given these findings, pharmacist recommends Zosyn. I spoke with the nephrologist as above. I spoke with the hospitalist as above who agrees that this patient is appropriate for admission.     Amount and/or Complexity of Data Reviewed Labs: ordered. Radiology: ordered.  Risk Prescription drug management. Decision regarding hospitalization.        Final diagnoses:  Nausea and vomiting, unspecified vomiting type  Complication of transplanted kidney, unspecified complication    ED Discharge Orders     None          Glendia Rocky SAILOR, [place] 03/03/24 1529    Doretha Folks, MD 03/06/24 1648

## 2024-03-03 NOTE — H&P (Signed)
 History and Physical   Daniel Hill FMW:[id number] DOB: [birth year] DOA: 03/03/2024  PCP: Thurmond Cathlyn LABOR., MD   Patient coming from: Home  Chief Complaint: Chest pain, nausea, vomiting  HPI: Daniel Hill is a 48 y.o. male with medical history significant of ESRD status post renal transplant now CKD, status post failed 3 pancreatic transplants, diabetes type 1, hypothyroidism, GERD, CAD, anxiety, depression, OSA, obesity, glaucoma presenting with chest pain, nausea, vomiting.  Patient reportedly noted nausea and vomiting starting overnight which she does have at times in the setting of gastroparesis.  This was followed by burning sensation in his chest that he initially attributed to GERD however the pain was persistent and began to worsen and radiate to his abdomen and shoulder.  Took aspirin  and nitro at home with some improvement.  EMS was called and patient initially was febrile to 100.3.  Patient denies chills, constipation, diarrhea, nausea, vomiting.  Patient notably was seen in July for chest pain and found to have moderate two-vessel disease currently being managed medically.  ED Course: Vital signs in the ED notable for temperature 99.4.  Blood pressure in the 150s-170 systolic.  Heart rate in the 80s-100s.  Respiratory rate in the teens-20s.  Lab workup included CMP with chloride 112, bicarb 18, BUN 38, creatinine 2.38 which is stable, glucose 217, protein 5.8 and albumin 3.0 on LFTs.  CBC with leukocytosis to 17.6 and hemoglobin stable at 11.5.  Troponin negative x 2.  Lipase normal.  BNP  mildly elevated at 434.  Lactic acid normal with repeat pending.  Respiratory panel for flu COVID RSV negative.  Urinalysis with glucose, hemoglobin, protein, bacteria.  Blood culture pending.  Chest x-ray showed no acute Sharol.  CT abdomen pelvis showed patient is status post bilateral nephrectomy with left lower quadrant renal graft showing stable hydronephrosis and new perinephric soft  tissue stranding possibly representing renal graft infection as well as stranding about the dome of the bladder.  Patient received Zosyn, Zofran , 1 L IV fluids, Phenergan  in the ED.  Case briefly discussed with nephrology to ensure patient is appropriate for admission here with his infected distant renal transplant.  And it was determined that it is reasonable to admit patient here.  Review of Systems: As per HPI otherwise all other systems reviewed and are negative.  Past Medical History:  Diagnosis Date   Anxiety    Depression    Diabetes mellitus    GERD (gastroesophageal reflux disease)    Hypothyroidism    Kidney replaced by transplant    Pancreas transplanted [facility])    Thyroid  disease     Past Surgical History:  Procedure Laterality Date   CORONARY ANGIOGRAPHY N/A 11/13/2023   Procedure: CORONARY ANGIOGRAPHY;  Surgeon: Jordan, Peter M, MD;  Location: [facility] INVASIVE CV LAB;  Service: Cardiovascular;  Laterality: N/A;   EYE SURGERY      Social History  reports that he has never smoked. He has never used smokeless tobacco. He reports current alcohol use. He reports that he does not use drugs.  Allergies  Allergen Reactions   Tomato Nausea And Vomiting    Immediate vomiting    Other Nausea And Vomiting    RAW Canteloupe, banana, tomatoes, all raw vegetables  Unless the vegetables have dressing on them.  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   Codeine Nausea And Vomiting   Metoclopramide Hcl Nausea And Vomiting    Family History  Problem Relation Age of Onset   Stroke Mother  Lymphoma Father    Diabetes type II Father    Heart attack Brother    Thyroid  cancer Brother   Reviewed on admission  Prior to Admission medications   Medication Sig Start Date End Date Taking? Authorizing Provider  acetaminophen  (TYLENOL ) 500 MG tablet Take 1,000 mg by mouth every 6 (six) hours as needed for moderate pain (pain score 4-6).   Yes [provider]  amLODipine   (NORVASC ) 10 MG tablet Take 1 tablet (10 mg total) by mouth daily. 12/14/23  Yes Ladona Heinz, MD  aspirin  EC 81 MG tablet Take 1 tablet (81 mg total) by mouth daily. Swallow whole. 11/15/23  Yes Amin, Ankit C, MD  cholecalciferol (VITAMIN D3) 25 MCG (1000 UNIT) tablet Take 1,000 Units by mouth daily.   Yes [provider]  esomeprazole  (NEXIUM ) 40 MG capsule Take 40 mg by mouth 2 (two) times daily.   Yes [provider]  FARXIGA  10 MG TABS tablet Take 1 tablet (10 mg total) by mouth daily. 12/05/23  Yes Ganji, Jay, MD  fluticasone (FLONASE) 50 MCG/ACT nasal spray Place 1 spray into both nostrils daily as needed for allergies. 06/12/23  Yes [provider]  furosemide  (LASIX ) 20 MG tablet Take 20 mg by mouth daily as needed for edema (for legs).   Yes [provider]  Insulin  Aspart, w/Niacinamide, (FIASP ) 100 UNIT/ML SOLN INJECT UP TO 50 UNITS UNDER THE SKIN VIA INSULIN  PUMP DAILY 11/14/23  Yes Trixie File, MD  isosorbide  mononitrate (IMDUR ) 60 MG 24 hr tablet Take 1 tablet (60 mg total) by mouth daily. 12/14/23  Yes Ladona Heinz, MD  levothyroxine  (SYNTHROID ) 75 MCG tablet TAKE 1 TABLET(75 MCG) BY MOUTH DAILY 02/11/22  Yes Trixie File, MD  MAGNESIUM  GLYCINATE PLUS PO Take 1 capsule by mouth daily.   Yes [provider]  metoprolol  succinate (TOPROL -XL) 50 MG 24 hr tablet Take 1 tablet (50 mg total) by mouth daily. Take with or immediately following a meal. 12/05/23  Yes Ladona Heinz, MD  Multiple Vitamins-Minerals (MULTIVITAMINS THER. W/MINERALS) TABS Take 1 tablet by mouth daily.   Yes [provider]  nitroGLYCERIN  (NITROSTAT ) 0.4 MG SL tablet Place 1 tablet (0.4 mg total) under the tongue every 5 (five) minutes as needed for chest pain. 11/14/23 11/13/24 Yes Amin, Ankit C, MD  prasugrel  (EFFIENT ) 10 MG TABS tablet Take 1 tablet (10 mg total) by mouth daily. 12/05/23  Yes Ladona Heinz, MD  promethazine  (PHENERGAN ) 25 MG tablet Take 25 mg by mouth  every 6 (six) hours as needed for nausea.   Yes [provider]  tacrolimus  (PROGRAF ) 1 MG capsule Take 1-2 mg by mouth See admin instructions. Take 2 capsules by mouth in the morning and 1 capsule at night   Yes [provider]  traMADol (ULTRAM) 50 MG tablet Take 50 mg by mouth every 6 (six) hours as needed for pain. 05/16/17  Yes [provider]  VELTASSA 8.4 g packet Take 8.4 g by mouth every other day.   Yes [provider]  Continuous Glucose Sensor (DEXCOM G6 SENSOR) MISC Use as instructed to check blood sugar. Change every 10 days 05/24/23   Trixie File, MD  Continuous Glucose Transmitter (DEXCOM G6 TRANSMITTER) MISC Use as instructed. Change every 90 days 05/24/23   Trixie File, MD  Glucagon  (BAQSIMI  TWO PACK) 3 MG/DOSE POWD Place 3 mg into the nose once as needed for up to 1 dose. Patient not taking: Reported on 03/03/2024 03/16/20   Trixie File,  MD  Insulin  Disposable Pump (OMNIPOD 5 DEXG7G6 PODS GEN 5) MISC Use 1 pod every 3 days 05/24/23   Trixie File, MD  METHYLENE BLUE, BULK-SOLID, POWD Take 1 Scoop by mouth daily. Mix with a beverage and drink    [provider]  SYRINGE/NEEDLE, DISP, 1 ML 23G X 1 1 ML MISC Use every week 07/05/19   Trixie File, MD  TUBERCULIN SYR 1CC/25GX5/8 (B-D TB SYRINGE 1CC/25GX5/8) 25G X 5/8 1 ML MISC USE 1 EVERY 2 WEEKS 03/31/20   Trixie File, MD    Physical Exam: Vitals:   03/03/24 1002 03/03/24 1102 03/03/24 1400 03/03/24 1500  BP:  (!) 166/92 (!) 170/95 (!) 179/87  Pulse:  89 (!) 103 (!) 107  Resp:  11 12 10   Temp:  99.1 F (37.3 C)  99.4 F (37.4 C)  TempSrc:  Oral  Oral  SpO2: 96% 99% 98% 94%    Physical Exam Constitutional:      General: He is not in acute distress.    Appearance: Normal appearance. He is ill-appearing.  HENT:     Head: Normocephalic and atraumatic.     Mouth/Throat:     Mouth: Mucous membranes are moist.     Pharynx: Oropharynx is clear.   Eyes:     Extraocular Movements: Extraocular movements intact.     Pupils: Pupils are equal, round, and reactive to light.  Cardiovascular:     Rate and Rhythm: Regular rhythm. Tachycardia present.     Pulses: Normal pulses.     Heart sounds: Normal heart sounds.  Pulmonary:     Effort: Pulmonary effort is normal. No respiratory distress.     Breath sounds: Normal breath sounds.  Abdominal:     General: Bowel sounds are normal. There is no distension.     Palpations: Abdomen is soft.     Tenderness: There is no abdominal tenderness.  Musculoskeletal:        General: No swelling or deformity.  Skin:    General: Skin is warm and dry.  Neurological:     General: No focal deficit present.     Mental Status: Mental status is at baseline.    Labs on Admission: I have personally reviewed following labs and imaging studies  CBC: Recent Labs  Lab 03/03/24 0934  WBC 17.6*  HGB 11.5*  HCT 34.9*  MCV 91.4  PLT 329    Basic Metabolic Panel: Recent Labs  Lab 03/03/24 0934  NA 139  K 4.4  CL 112*  CO2 18*  GLUCOSE 217*  BUN 38*  CREATININE 2.38*  CALCIUM  8.9    GFR: CrCl cannot be calculated (Unknown ideal weight.).  Liver Function Tests: Recent Labs  Lab 03/03/24 1031  AST 20  ALT 19  ALKPHOS 88  BILITOT 0.4  PROT 5.8*  ALBUMIN 3.0*    Urine analysis:    Component Value Date/Time   COLORURINE YELLOW 03/03/2024 1031   APPEARANCEUR CLEAR 03/03/2024 1031   LABSPEC 1.011 03/03/2024 1031   PHURINE 5.0 03/03/2024 1031   GLUCOSEU >=500 (A) 03/03/2024 1031   HGBUR SMALL (A) 03/03/2024 1031   BILIRUBINUR NEGATIVE 03/03/2024 1031   KETONESUR NEGATIVE 03/03/2024 1031   PROTEINUR >=300 (A) 03/03/2024 1031   UROBILINOGEN 0.2 05/19/2011 0014   NITRITE NEGATIVE 03/03/2024 1031   LEUKOCYTESUR NEGATIVE 03/03/2024 1031    Radiological Exams on Admission: CT CHEST ABDOMEN PELVIS WO CONTRAST Result Date: 03/03/2024 EXAM: CT CHEST, ABDOMEN AND PELVIS WITHOUT  CONTRAST 03/03/2024 11:45:17 AM TECHNIQUE: CT  of the chest, abdomen and pelvis was performed without the administration of intravenous contrast. Multiplanar reformatted images are provided for review. Automated exposure control, iterative reconstruction, and/or weight based adjustment of the mA/kV was utilized to reduce the radiation dose to as low as reasonably achievable. COMPARISON: 06/27/2016 CLINICAL HISTORY: low grade fever, elevated WBC count, nausea/vomiting/chest pain, h/o pancreas / kidney transplant FINDINGS: CHEST: MEDIASTINUM AND LYMPH NODES: Heart and pericardium are unremarkable. Coronary artery calcifications. The central airways are clear. No mediastinal, hilar or axillary lymphadenopathy. LUNGS AND PLEURA: Mild scarring within the posterior right lung base. No focal consolidation or pulmonary edema. No pleural effusion or pneumothorax. No signs of interstitial edema. ABDOMEN AND PELVIS: LIVER: The liver is unremarkable. GALLBLADDER AND BILE DUCTS: Gallbladder is unremarkable. No biliary ductal dilatation. SPLEEN: The spleen is within normal limits in size and appearance. PANCREAS: The pancreas appears diffusely atrophic. No main duct dilatation, inflammation, or mass. ADRENAL GLANDS: Normal size and morphology bilaterally. No nodule, thickening, or hemorrhage. No periadrenal stranding. KIDNEYS, URETERS AND BLADDER: Status post bilateral nephrectomy. Left lower quadrant renal graft with unchanged mild hydronephrosis but new perinephric soft tissue stranding. Mild stranding around the dome of the bladder is also noted. No stones in the kidneys or ureters. GI AND BOWEL: Stomach demonstrates no acute abnormality. No signs of bowel inflammation or distention. Moderate stool burden identified within the cecum. There is no bowel obstruction. REPRODUCTIVE ORGANS: The prostate gland appears normal. PERITONEUM AND RETROPERITONEUM: No ascites. No free air. VASCULATURE: Aorta is normal in caliber. ABDOMINAL  AND PELVIS LYMPH NODES: Prominent mesenteric and retroperitoneal lymph nodes which appears stable from the prior exam. Left index periaortic lymph node is stable measuring 1 cm, image 76/3. BONES AND SOFT TISSUES: Multilevel thoracic degenerative disc disease. No acute or suspicious osseous abnormality. No focal soft tissue abnormality. IMPRESSION: 1. Left lower quadrant renal graft with unchanged mild hydronephrosis and new perinephric soft tissue stranding. Correlate for any clinical signs or symptoms of renal graft infection 2. Mild stranding around the dome of the bladder, which may reflect underlying cystitis. Electronically signed by: Waddell Calk MD 03/03/2024 12:18 PM EST RP Workstation: [id number]   DG Chest 2 View Result Date: 03/03/2024 EXAM: 2 VIEW(S) XRAY OF THE CHEST 03/03/2024 10:19:45 AM COMPARISON: 11/11/2023 CLINICAL HISTORY: chest pain FINDINGS: LUNGS AND PLEURA: No focal pulmonary opacity. No pulmonary edema. No pleural effusion. No pneumothorax. HEART AND MEDIASTINUM: No acute abnormality of the cardiac and mediastinal silhouettes. BONES AND SOFT TISSUES: No acute osseous abnormality. IMPRESSION: 1. No acute cardiopulmonary process. Electronically signed by: Waddell Calk MD 03/03/2024 10:37 AM EST RP Workstation: [id number]   EKG: Independently reviewed.  Sinus tachycardia 113 beats minute.  Largely nonspecific T wave changes, with questionable ant tear/septal T wave depressions.  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  CAD addressed below.  Found to have evidence of infected renal transplant with stranding about the renal transplant and dome of the bladder on CT.  Leukocytosis to 17.6.  Fever for EMS to 100.3 temperature in the ED 99.4. > Urinalysis with glucose, hemoglobin, protein, bacteria.  Blood cultures pending.  Will add on urine cultures. - Monitor on telemetry - Continue with Zosyn as ordered in the ED - Follow-up urine culture, blood culture - Trend fever curve and WBC - Supportive care  Chest pain CAD > Known CAD with cath in July of this year with moderate two-vessel disease with recommendation for medical therapy. > EKG had questionable T wave depression in anterior/septal leads, however troponin negative x 2. - Continue to monitor, repeat repeat troponin if recurrent cardiac symptoms Continue home aspirin , prasugrel ,-metoprolol , Imdur   CKD 3 Status post renal transplant > History of ESRD, now status post bilateral nephrectomy and renal transplant. > Remains on tacrolimus .  Last followed with [facility] last year.  Transplant was 2003. > Creatinine stable at 2.38. - Continue tacrolimus  - Trend renal function and electrolytes  Status post failed pancreatic transplant Type 1 diabetes > Continue tacrolimus  - Continue insulin  pump with glucose monitoring  Hypothyroidism - Continue Synthroid   GERD - Continue PPI   DVT prophylaxis: Heparin  Code Status:   Full Family Communication:  None on admission  Disposition Plan:   Patient is from:  Home  Anticipated DC to:  Home  Anticipated DC date:  1 to 4 days  Anticipated DC barriers: None  Consults called:  Brief discussed with nephrology in the ED to confirm appropriate for admission,  not following Admission status:  Observation, telemetry  Severity of Illness: The appropriate patient status for this patient is OBSERVATION. Observation status is judged to be reasonable and necessary in order to provide the required intensity of service to ensure the patient's safety. The patient's presenting symptoms, physical exam findings, and initial radiographic and laboratory data in the context of their medical condition is felt to place them at decreased risk for further clinical deterioration. Furthermore, it is anticipated that the patient will be medically stable for discharge from the hospital within 2 midnights of admission.    Marsa KATHEE Scurry MD [facility]  How to contact the [facility] Attending or Consulting provider 7A - 7P or covering provider during after hours 7P -7A, for this patient?   Check the care team in [facility] and look for a) attending/consulting [facility] provider listed and b) the [facility] team listed Log into [email or web address] and use [place]'s universal password to access. If you do not have the password, please contact the hospital operator. Locate the [facility] provider you are looking for under [facility] and page to a number that you can be directly reached. If you still have difficulty reaching the provider, please page the [facility] (Director on Call) for the Hospitalists listed on amion for assistance.  03/03/2024, 3:34 PM

## 2024-03-04 ENCOUNTER — Encounter (HOSPITAL_COMMUNITY): Payer: Self-pay | Admitting: Internal Medicine

## 2024-03-04 DIAGNOSIS — Y830 Surgical operation with transplant of whole organ as the cause of abnormal reaction of the patient, or of later complication, without mention of misadventure at the time of the procedure: Secondary | ICD-10-CM | POA: Diagnosis present

## 2024-03-04 DIAGNOSIS — Z8249 Family history of ischemic heart disease and other diseases of the circulatory system: Secondary | ICD-10-CM | POA: Diagnosis not present

## 2024-03-04 DIAGNOSIS — Z7982 Long term (current) use of aspirin: Secondary | ICD-10-CM | POA: Diagnosis not present

## 2024-03-04 DIAGNOSIS — Z1152 Encounter for screening for COVID-19: Secondary | ICD-10-CM | POA: Diagnosis not present

## 2024-03-04 DIAGNOSIS — K219 Gastro-esophageal reflux disease without esophagitis: Secondary | ICD-10-CM | POA: Diagnosis present

## 2024-03-04 DIAGNOSIS — E1043 Type 1 diabetes mellitus with diabetic autonomic (poly)neuropathy: Secondary | ICD-10-CM | POA: Diagnosis present

## 2024-03-04 DIAGNOSIS — N1330 Unspecified hydronephrosis: Secondary | ICD-10-CM | POA: Diagnosis present

## 2024-03-04 DIAGNOSIS — N179 Acute kidney failure, unspecified: Secondary | ICD-10-CM | POA: Diagnosis present

## 2024-03-04 DIAGNOSIS — R079 Chest pain, unspecified: Secondary | ICD-10-CM

## 2024-03-04 DIAGNOSIS — D849 Immunodeficiency, unspecified: Secondary | ICD-10-CM | POA: Diagnosis present

## 2024-03-04 DIAGNOSIS — E1022 Type 1 diabetes mellitus with diabetic chronic kidney disease: Secondary | ICD-10-CM | POA: Diagnosis present

## 2024-03-04 DIAGNOSIS — Z7902 Long term (current) use of antithrombotics/antiplatelets: Secondary | ICD-10-CM | POA: Diagnosis not present

## 2024-03-04 DIAGNOSIS — T8613 Kidney transplant infection: Secondary | ICD-10-CM | POA: Diagnosis present

## 2024-03-04 DIAGNOSIS — Z79899 Other long term (current) drug therapy: Secondary | ICD-10-CM | POA: Diagnosis not present

## 2024-03-04 DIAGNOSIS — T8619 Other complication of kidney transplant: Secondary | ICD-10-CM | POA: Diagnosis present

## 2024-03-04 DIAGNOSIS — E063 Autoimmune thyroiditis: Secondary | ICD-10-CM | POA: Diagnosis present

## 2024-03-04 DIAGNOSIS — Z833 Family history of diabetes mellitus: Secondary | ICD-10-CM | POA: Diagnosis not present

## 2024-03-04 DIAGNOSIS — F330 Major depressive disorder, recurrent, mild: Secondary | ICD-10-CM | POA: Diagnosis present

## 2024-03-04 DIAGNOSIS — Z79890 Hormone replacement therapy (postmenopausal): Secondary | ICD-10-CM | POA: Diagnosis not present

## 2024-03-04 DIAGNOSIS — R112 Nausea with vomiting, unspecified: Secondary | ICD-10-CM | POA: Diagnosis not present

## 2024-03-04 DIAGNOSIS — N12 Tubulo-interstitial nephritis, not specified as acute or chronic: Secondary | ICD-10-CM | POA: Diagnosis present

## 2024-03-04 DIAGNOSIS — G4733 Obstructive sleep apnea (adult) (pediatric): Secondary | ICD-10-CM | POA: Diagnosis present

## 2024-03-04 DIAGNOSIS — Z9483 Pancreas transplant status: Secondary | ICD-10-CM | POA: Diagnosis not present

## 2024-03-04 DIAGNOSIS — I129 Hypertensive chronic kidney disease with stage 1 through stage 4 chronic kidney disease, or unspecified chronic kidney disease: Secondary | ICD-10-CM | POA: Diagnosis present

## 2024-03-04 DIAGNOSIS — F411 Generalized anxiety disorder: Secondary | ICD-10-CM | POA: Diagnosis present

## 2024-03-04 DIAGNOSIS — N1832 Chronic kidney disease, stage 3b: Secondary | ICD-10-CM | POA: Diagnosis present

## 2024-03-04 DIAGNOSIS — Z940 Kidney transplant status: Secondary | ICD-10-CM | POA: Diagnosis not present

## 2024-03-04 DIAGNOSIS — I2510 Atherosclerotic heart disease of native coronary artery without angina pectoris: Secondary | ICD-10-CM | POA: Diagnosis present

## 2024-03-04 LAB — GLUCOSE, CAPILLARY
Glucose-Capillary: 178 mg/dL — ABNORMAL HIGH (ref 70–99)
Glucose-Capillary: 136 mg/dL — ABNORMAL HIGH (ref 70–99)
Glucose-Capillary: 161 mg/dL — ABNORMAL HIGH (ref 70–99)
Glucose-Capillary: 216 mg/dL — ABNORMAL HIGH (ref 70–99)
Glucose-Capillary: 247 mg/dL — ABNORMAL HIGH (ref 70–99)

## 2024-03-04 LAB — CBC
HCT: 32.5 % — ABNORMAL LOW (ref 39.0–52.0)
Hemoglobin: 10.7 g/dL — ABNORMAL LOW (ref 13.0–17.0)
MCH: 30 pg (ref 26.0–34.0)
MCHC: 32.9 g/dL (ref 30.0–36.0)
MCV: 91 fL (ref 80.0–100.0)
Platelets: 293 K/uL (ref 150–400)
RBC: 3.57 MIL/uL — ABNORMAL LOW (ref 4.22–5.81)
RDW: 14 % (ref 11.5–15.5)
WBC: 20.6 K/uL — ABNORMAL HIGH (ref 4.0–10.5)
nRBC: 0 % (ref 0.0–0.2)

## 2024-03-04 LAB — COMPREHENSIVE METABOLIC PANEL WITH GFR
ALT: 24 U/L (ref 0–44)
AST: 26 U/L (ref 15–41)
Albumin: 2.4 g/dL — ABNORMAL LOW (ref 3.5–5.0)
Alkaline Phosphatase: 82 U/L (ref 38–126)
Anion gap: 8 (ref 5–15)
BUN: 43 mg/dL — ABNORMAL HIGH (ref 6–20)
CO2: 20 mmol/L — ABNORMAL LOW (ref 22–32)
Calcium: 9.1 mg/dL (ref 8.9–10.3)
Chloride: 110 mmol/L (ref 98–111)
Creatinine, Ser: 2.83 mg/dL — ABNORMAL HIGH (ref 0.61–1.24)
GFR, Estimated: 26 mL/min — ABNORMAL LOW (ref >60)
Glucose, Bld: 183 mg/dL — ABNORMAL HIGH (ref 70–99)
Potassium: 4.5 mmol/L (ref 3.5–5.1)
Sodium: 138 mmol/L (ref 135–145)
Total Bilirubin: 0.4 mg/dL (ref 0.0–1.2)
Total Protein: 5.5 g/dL — ABNORMAL LOW (ref 6.5–8.1)

## 2024-03-04 LAB — PROCALCITONIN: Procalcitonin: 4.12 ng/mL

## 2024-03-04 LAB — C-REACTIVE PROTEIN: CRP: 8.3 mg/dL — ABNORMAL HIGH (ref <1.0)

## 2024-03-04 MED ORDER — LACTATED RINGERS IV SOLN: INTRAVENOUS | Status: DC

## 2024-03-04 MED ORDER — HYDRALAZINE HCL 20 MG/ML IJ SOLN
10.0000 mg | Freq: Four times a day (QID) | INTRAMUSCULAR | Status: DC | PRN
  Administered 2024-03-04 – 2024-03-05 (×2): 10 mg via INTRAVENOUS
  Filled 2024-03-04 (×2): qty 1

## 2024-03-04 NOTE — Evaluation (Signed)
 Physical Therapy Evaluation & Discharge Patient Details Name: Daniel Hill MRN: [id number] DOB: 1976/02/08 Today's Date: 03/04/2024  History of Present Illness  Pt is a 48 y.o. male who presented 03/03/24 with chest pain, nausea, and vomiting. Pt admitted with pyelonephritis/infected renal transplant. PMH: ESRD status post renal transplant now CKD, status post failed 3 pancreatic transplants, diabetes type 1, hypothyroidism, GERD, CAD, anxiety, depression, OSA, obesity, glaucoma   Clinical Impression  Pt presents with condition above. The pt appears and reports to be back to his baseline functional status, mobilizing independently without DME or LOB. Encouraged pt to walk the halls 3x/day at least and educated him on how to [facility] and rehook his lines/leads. RN aware and agrees with plan. No further PT services needed at this time. All education completed and questions answered. PT will sign off. Thank you for this referral.        If plan is discharge home, recommend the following:  (N/A)   Can travel by private vehicle        Equipment Recommendations None recommended by PT  Recommendations for Other Services       Functional Status Assessment Patient has not had a recent decline in their functional status     Precautions / Restrictions Precautions Precautions: None Restrictions Weight Bearing Restrictions Per Provider Order: No      Mobility  Bed Mobility Overal bed mobility: Modified Independent             General bed mobility comments: HOB elevated, no assistance needed    Transfers Overall transfer level: Independent Equipment used: None               General transfer comment: No LOB, no assistance needed    Ambulation/Gait Ambulation/Gait assistance: Independent Gait Distance (Feet): 210 Feet Assistive device: None Gait Pattern/deviations: WFL(Within Functional Limits) Gait velocity: WFL     General Gait Details: No drastic gait deviations  noted. No LOB  Stairs            Wheelchair Mobility     Tilt Bed    Modified Rankin (Stroke Patients Only)       Balance Overall balance assessment: No apparent balance deficits (not formally assessed)                                           Pertinent Vitals/Pain Pain Assessment Pain Assessment: Faces Faces Pain Scale: No hurt Pain Intervention(s): Monitored during session    Home Living Family/patient expects to be discharged to:: Private residence Living Arrangements: Spouse/significant other;Children (x2 daughters) Available Help at Discharge: Family;Available 24 hours/day Type of Home: House Home Access: Stairs to enter Entrance Stairs-Rails: None Entrance Stairs-Number of Steps: 3   Home Layout: Two level;Able to live on main level with bedroom/bathroom (upstairs is unfinished attic) Home Equipment: None      Prior Function Prior Level of Function : Independent/Modified Independent;Working/employed               ADLs Comments: Works at [employer] in [place] Extremity Assessment Upper Extremity Assessment: Overall [facility] for tasks assessed    Lower Extremity Assessment Lower Extremity Assessment: Overall WFL for tasks assessed    Cervical / Trunk Assessment Cervical / Trunk Assessment: Normal  Communication   Communication Communication: No apparent difficulties    Cognition Arousal: Alert Behavior During Therapy:  WFL for tasks assessed/performed   PT - Cognitive impairments: No apparent impairments                         Following commands: Intact       Cueing Cueing Techniques: Verbal cues     General Comments General comments (skin integrity, edema, etc.): VSS on RA; Encouraged pt to walk the halls 3x/day at least and educated him on how to [facility] and rehook his lines/leads. RN aware and agrees with plan.    Exercises     Assessment/Plan    PT Assessment  Patient does not need any further PT services  PT Problem List         PT Treatment Interventions      PT Goals (Current goals can be found in the Care Plan section)  Acute Rehab PT Goals Patient Stated Goal: to continue to improve PT Goal Formulation: All assessment and education complete, DC therapy Time For Goal Achievement: 03/05/24 Potential to Achieve Goals: Good    Frequency       Co-evaluation               AM-PAC PT 6 Clicks Mobility  Outcome Measure Help needed turning from your back to your side while in a flat bed without using bedrails?: None Help needed moving from lying on your back to sitting on the side of a flat bed without using bedrails?: None Help needed moving to and from a bed to a chair (including a wheelchair)?: None Help needed standing up from a chair using your arms (e.g., wheelchair or bedside chair)?: None Help needed to walk in hospital room?: None Help needed climbing 3-5 steps with a railing? : None 6 Click Score: 24    End of Session   Activity Tolerance: Patient tolerated treatment well Patient left: in bed;with call bell/phone within reach;with family/visitor present Nurse Communication: Mobility status PT Visit Diagnosis: Pain Pain - part of body:  (chest per admitting presentation)    Time: 0821-0834 PT Time Calculation (min) (ACUTE ONLY): 13 min   Charges:   PT Evaluation $PT Eval Low Complexity: 1 Low   PT General Charges $$ ACUTE PT VISIT: 1 Visit         Theo Ferretti, PT, DPT Acute Rehabilitation Services  Office: [phone]   Theo CHRISTELLA Ferretti 03/04/2024, 8:45 AM

## 2024-03-04 NOTE — Consult Note (Signed)
 [facility]    Date of Admission:  03/03/2024     Total days of antibiotics 2               Reason for Consult: Renal Transplant Infection   Referring Provider: Dr. Lavada Stank Primary Care Provider: Thurmond Cathlyn LABOR., MD   ASSESSMENT:  Daniel Hill is a 48 year old Caucasian male with history of renal transplant and multiple pancreatic transplants presenting with nausea, vomiting, and chest pain and imaging concerning for possible transplant graft infection and cystitis.  Urine does not appear consistent with infection and has no current urinary symptoms.  Blood cultures are without growth and no evidence of bacteremia.  Has had low-grade fevers at times but would suspect he would have bacteremia given extended duration of time if his graft were to be infected.  Discussed recommended plan of care to continue monitoring cultures and will continue with current dose of piperacillin-tazobactam.  Cannot exclude a viral syndrome.  Does appear to be improving at this point.  Continue standard/universal precautions.  Remaining medical and supportive care per internal medicine.  PLAN:  Continue current dose of piperacillin-tazobactam. Monitor blood cultures for evidence of bacteremia. Renal transplant per nephrology. Standard/universal precautions. Remaining medical and supportive care per internal medicine.   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 male with previous medical history of kidney and pancreas (x3) transplant, depression, diabetes, glaucoma, obstructive sleep apnea, and hypothyroidism presenting from home with nausea, vomiting, and chest pain.  Daniel Hill began noticing nausea and vomiting overnight prior to arrival in the setting of previously diagnosed gastroparesis describing a burning sensation in his chest and left upper extremity and took aspirin  and nitroglycerin  with some improvement. Initially afebrile with WBC count of 20,600. Started on broad spectrum antibiotics with piperacillin-tazobactam.  Chest x-ray with no acute cardiopulmonary process.  CT chest/abdomen/pelvis with left lower quadrant renal graft with unchanged mild hydronephrosis and new perinephric soft tissue stranding and concern for renal graft infection and mild stranding around the dome of the bladder possibly representing cystitis.  Blood cultures have remained without growth since admission.  Has been afebrile.  Currently on day 2 of piperacillin-tazobactam.  No current urinary symptoms with urinalysis showing glucosuria and proteinuria without any significant evidence of infection.  ID has been asked for antibiotic recommendations.     Review of Systems: Review of Systems  Constitutional:  Negative for chills, fever and weight loss.  Respiratory:  Negative for cough, shortness of breath and  wheezing.   Cardiovascular:  Negative for chest pain and leg swelling.  Gastrointestinal:  Negative for abdominal pain, constipation, diarrhea, nausea and vomiting.  Skin:  Negative for rash.     Past Medical History:  Diagnosis Date   Anxiety    Depression    Diabetes mellitus    GERD (gastroesophageal reflux disease)    Hypothyroidism    Kidney replaced by transplant    Pancreas transplanted (HCC)    Thyroid  disease     Social History   Tobacco Use   Smoking status: Never   Smokeless tobacco: Never  Vaping Use   Vaping status: Never Used  Substance Use Topics   Alcohol use: Yes    Comment: Rarely - maybe once a year   Drug use: No    Family History  Problem Relation Age of Onset   Stroke Mother    Lymphoma Father    Diabetes type II Father    Heart attack Brother    Thyroid  cancer Brother     Allergies  Allergen Reactions   Other Nausea And Vomiting    RAW Canteloupe, banana, tomatoes, all raw vegetables  Unless the vegetables have dressing on them.  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   Codeine Nausea And Vomiting   Metoclopramide Hcl Nausea And Vomiting    OBJECTIVE: Blood pressure (!) 172/87, pulse 75, temperature 98.1 F (36.7 C), temperature source Oral, resp. rate 20, height 5' 9 (1.753 m), weight 92.1 kg, SpO2 93%.  Physical Exam Constitutional:      General: He is not in acute distress.    Appearance: He is well-developed.  Cardiovascular:     Rate and Rhythm: Normal rate and regular rhythm.     Heart sounds: Normal heart sounds.  Pulmonary:     Effort: Pulmonary effort is normal.     Breath sounds: Normal breath sounds.  Skin:    General: Skin is warm and dry.  Neurological:     Mental Status: He is alert and oriented to person, place, and time.     Lab Results Lab Results  Component Value Date   WBC 20.6 (H) 03/04/2024   HGB 10.7 (L) 03/04/2024   HCT 32.5 (L) 03/04/2024   MCV 91.0 03/04/2024   PLT 293 03/04/2024     Lab Results  Component Value Date   CREATININE 2.83 (H) 03/04/2024   BUN 43 (H) 03/04/2024   NA 138 03/04/2024   K 4.5 03/04/2024   CL 110 03/04/2024   CO2 20 (L) 03/04/2024    Lab Results  Component Value Date   ALT 24 03/04/2024   AST 26 03/04/2024   ALKPHOS 82 03/04/2024   BILITOT 0.4 03/04/2024     Microbiology: Recent Results (from the past 240 hours)  Blood Culture (routine x 2)     Status: None (Preliminary result)   Collection Time: 03/03/24 10:32 AM   Specimen: BLOOD RIGHT HAND  Result Value Ref Range Status   Specimen Description BLOOD RIGHT HAND  Final   Special Requests   Final    BOTTLES DRAWN AEROBIC AND ANAEROBIC Blood Culture adequate volume   Culture   Final  NO GROWTH < 24 HOURS Performed at [facility], [address] [place], [place] [address]    Report Status PENDING  Incomplete  Blood Culture (routine x 2)     Status: None (Preliminary result)   Collection Time: 03/03/24 10:42 AM   Specimen: BLOOD RIGHT HAND  Result Value Ref Range Status   Specimen Description BLOOD RIGHT HAND  Final   Special Requests   Final    BOTTLES DRAWN AEROBIC AND ANAEROBIC Blood Culture adequate volume   Culture   Final    NO GROWTH < 24 HOURS Performed at [facility], [address], [place], [place] [address]    Report Status PENDING  Incomplete  Resp panel by RT-PCR (RSV, Flu A&B, Covid) Anterior Nasal Swab     Status: None   Collection Time: 03/03/24 11:29 AM   Specimen: Anterior Nasal Swab  Result Value Ref Range Status   SARS Coronavirus 2 by RT PCR NEGATIVE NEGATIVE Final   Influenza A by PCR NEGATIVE NEGATIVE Final   Influenza B by PCR NEGATIVE NEGATIVE Final    Comment: (NOTE) The Xpert Xpress SARS-CoV-2/FLU/RSV plus assay is intended as an aid in the diagnosis of influenza from Nasopharyngeal swab specimens and should not be used as a sole basis for treatment. Nasal washings and aspirates are unacceptable for Xpert Xpress  SARS-CoV-2/FLU/RSV testing.  Fact Sheet for Patients: bloggercourse.com  Fact Sheet for Healthcare Providers: seriousbroker.it  This test is not yet approved or cleared by the [place]  FDA and has been authorized for detection and/or diagnosis of SARS-CoV-2 by FDA under an Emergency Use Authorization (EUA). This EUA will remain in effect (meaning this test can be used) for the duration of the COVID-19 declaration under Section 564(b)(1) of the Act, 21 U.S.C. section 360bbb-3(b)(1), unless the authorization is terminated or revoked.     Resp Syncytial Virus by PCR NEGATIVE NEGATIVE Final    Comment: (NOTE) Fact Sheet for Patients: bloggercourse.com  Fact Sheet for Healthcare Providers: seriousbroker.it  This test is not yet approved or cleared by the [place]  FDA and has been authorized for detection and/or diagnosis of SARS-CoV-2 by FDA under an Emergency Use Authorization (EUA). This EUA will remain in effect (meaning this test can be used) for the duration of the COVID-19 declaration under Section 564(b)(1) of the Act, 21 U.S.C. section 360bbb-3(b)(1), unless the authorization is terminated or revoked.  Performed at [facility], [address] [place], [place] [address]      Cathlyn July, NP [facility]  03/04/2024  3:53 PM

## 2024-03-04 NOTE — Consult Note (Signed)
 [facility] Nephrology Consultation Note  Requesting MD: Dr. Dennise, Lavada Reason for consult: Kidney transplant pt  HPI:  Daniel Hill is a 48 y.o. male with past medical history significant for type 1 diabetes status post deceased donor kidney transplant 2003, multiple pancreatic transplant which was failed, acid reflux, CAD, anxiety depression who was presented with nausea vomiting and chest pain seen as a consultation for care surrounding kidney transplant and immunosuppression. The patient has CKD stage IIIb with a baseline serum creatinine level fluctuating around 1.59-2.09 and follows with Dr. Tobie at [facility]  Noted some of the creatinine level were up to 2.34 recently.  Apparently he developed adverse side effect/diarrhea with CellCept and currently only on Prograf  2 mg in the morning and 1 mg at night. He reported severe nausea vomiting and chest pain associated with generalized weakness and just not feeling well.  He was febrile in ER.  The workup noted possible kidney transplant pyelonephritis. Apparently the urine is unremarkable the CT scan consistent with unchanged mild hydronephrosis but new evidence of possible pyelonephritis.  The blood cultures were sent and patient was treated with IV Zosyn. On admission the creatinine level was around 2.38 which was elevated to 2.83 today.  He had urine output of around 1 L in 24 hours.  The patient was not hypotensive. Currently he reports feeling better.  Denies nausea, vomiting, diarrhea, chest pain, or shortness of breath.  Denies dysuria, urgency, pelvic or flank pain. PMHx:   Past Medical History:  Diagnosis Date   Anxiety    Depression    Diabetes mellitus    GERD (gastroesophageal reflux disease)    Hypothyroidism    Kidney replaced by transplant    Pancreas transplanted [facility])    Thyroid  disease     Past Surgical History:  Procedure Laterality Date   CORONARY ANGIOGRAPHY N/A 11/13/2023    Procedure: CORONARY ANGIOGRAPHY;  Surgeon: Jordan, Peter M, MD;  Location: [facility] INVASIVE CV LAB;  Service: Cardiovascular;  Laterality: N/A;   EYE SURGERY      Family Hx:  Family History  Problem Relation Age of Onset   Stroke Mother    Lymphoma Father    Diabetes type II Father    Heart attack Brother    Thyroid  cancer Brother     Social History:  reports that he has never smoked. He has never used smokeless tobacco. He reports current alcohol use. He reports that he does not use drugs.  Allergies:  Allergies  Allergen Reactions   Other Nausea And Vomiting    RAW Canteloupe, banana, tomatoes, all raw vegetables  Unless the vegetables have dressing on them.  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   Codeine Nausea And Vomiting   Metoclopramide Hcl Nausea And Vomiting    Medications: Prior to Admission medications   Medication Sig Start Date End Date Taking? Authorizing Provider  acetaminophen  (TYLENOL ) 500 MG tablet Take 1,000 mg by mouth every 6 (six) hours as needed for moderate pain (pain score 4-6).   Yes [provider]  amLODipine  (NORVASC ) 10 MG tablet Take 1 tablet (10 mg total) by mouth daily. 12/14/23  Yes Ladona Heinz, MD  aspirin  EC 81 MG tablet Take 1 tablet (81 mg total) by mouth daily. Swallow whole. 11/15/23  Yes Amin, Ankit C, MD  cholecalciferol (VITAMIN D3) 25 MCG (1000 UNIT) tablet Take 1,000 Units by mouth daily.   Yes [provider]  esomeprazole  (NEXIUM ) 40 MG capsule Take 40  mg by mouth 2 (two) times daily.   Yes [provider]  FARXIGA  10 MG TABS tablet Take 1 tablet (10 mg total) by mouth daily. 12/05/23  Yes Ganji, Jay, MD  fluticasone (FLONASE) 50 MCG/ACT nasal spray Place 1 spray into both nostrils daily as needed for allergies. 06/12/23  Yes [provider]  furosemide  (LASIX ) 20 MG tablet Take 20 mg by mouth daily as needed for edema (for legs).   Yes [provider]  Insulin  Aspart, w/Niacinamide,  (FIASP ) 100 UNIT/ML SOLN INJECT UP TO 50 UNITS UNDER THE SKIN VIA INSULIN  PUMP DAILY 11/14/23  Yes Trixie File, MD  isosorbide  mononitrate (IMDUR ) 60 MG 24 hr tablet Take 1 tablet (60 mg total) by mouth daily. 12/14/23  Yes Ladona Heinz, MD  levothyroxine  (SYNTHROID ) 75 MCG tablet TAKE 1 TABLET(75 MCG) BY MOUTH DAILY 02/11/22  Yes Trixie File, MD  MAGNESIUM  GLYCINATE PLUS PO Take 1 capsule by mouth daily.   Yes [provider]  metoprolol  succinate (TOPROL -XL) 50 MG 24 hr tablet Take 1 tablet (50 mg total) by mouth daily. Take with or immediately following a meal. 12/05/23  Yes Ladona Heinz, MD  Multiple Vitamins-Minerals (MULTIVITAMINS THER. W/MINERALS) TABS Take 1 tablet by mouth daily.   Yes [provider]  nitroGLYCERIN  (NITROSTAT ) 0.4 MG SL tablet Place 1 tablet (0.4 mg total) under the tongue every 5 (five) minutes as needed for chest pain. 11/14/23 11/13/24 Yes Amin, Ankit C, MD  prasugrel  (EFFIENT ) 10 MG TABS tablet Take 1 tablet (10 mg total) by mouth daily. 12/05/23  Yes Ladona Heinz, MD  promethazine  (PHENERGAN ) 25 MG tablet Take 25 mg by mouth every 6 (six) hours as needed for nausea.   Yes [provider]  tacrolimus  (PROGRAF ) 1 MG capsule Take 1-2 mg by mouth See admin instructions. Take 2 capsules by mouth in the morning and 1 capsule at night   Yes [provider]  traMADol (ULTRAM) 50 MG tablet Take 50 mg by mouth every 6 (six) hours as needed for pain. 05/16/17  Yes [provider]  VELTASSA 8.4 g packet Take 8.4 g by mouth every other day.   Yes [provider]  Continuous Glucose Sensor (DEXCOM G6 SENSOR) MISC Use as instructed to check blood sugar. Change every 10 days 05/24/23   Trixie File, MD  Continuous Glucose Transmitter (DEXCOM G6 TRANSMITTER) MISC Use as instructed. Change every 90 days 05/24/23   Trixie File, MD  Glucagon  (BAQSIMI  TWO PACK) 3 MG/DOSE POWD Place 3 mg into the nose once as needed for up to 1  dose. Patient not taking: Reported on 03/03/2024 03/16/20   Trixie File, MD  Insulin  Disposable Pump (OMNIPOD 5 DEXG7G6 PODS GEN 5) MISC Use 1 pod every 3 days 05/24/23   Trixie File, MD  METHYLENE BLUE, BULK-SOLID, POWD Take 1 Scoop by mouth daily. Mix with a beverage and drink    [provider]  SYRINGE/NEEDLE, DISP, 1 ML 23G X 1 1 ML MISC Use every week 07/05/19   Trixie File, MD  TUBERCULIN SYR 1CC/25GX5/8 (B-D TB SYRINGE 1CC/25GX5/8) 25G X 5/8 1 ML MISC USE 1 EVERY 2 WEEKS 03/31/20   Trixie File, MD    I have reviewed the patient's current medications.  Labs: Renal Panel: Recent Labs  Lab 03/03/24 0934 03/04/24 0352  NA 139 138  K 4.4 4.5  CL 112* 110  CO2 18* 20*  GLUCOSE 217* 183*  BUN 38* 43*  CREATININE 2.38* 2.83*  CALCIUM  8.9  9.1     CBC:    Latest Ref Rng & Units 03/04/2024    3:52 AM 03/03/2024    9:34 AM 11/14/2023    2:40 AM  CBC  WBC 4.0 - 10.5 K/uL 20.6  17.6  7.4   Hemoglobin 13.0 - 17.0 g/dL 89.2  88.4  9.0   Hematocrit 39.0 - 52.0 % 32.5  34.9  27.4   Platelets 150 - 400 K/uL 293  329  266      Anemia Panel:  Recent Labs    11/13/23 0244 11/13/23 1441 11/14/23 0240 03/03/24 0934 03/04/24 0352  HGB 9.7* 10.0* 9.0* 11.5* 10.7*  MCV 94.6 95.6 95.8 91.4 91.0    Recent Labs  Lab 03/03/24 1031 03/04/24 0352  AST 20 26  ALT 19 24  ALKPHOS 88 82  BILITOT 0.4 0.4  PROT 5.8* 5.5*  ALBUMIN 3.0* 2.4*    Lab Results  Component Value Date   HGBA1C 6.4 (H) 11/11/2023    ROS:  Pertinent items noted in HPI and remainder of comprehensive ROS otherwise negative.  Physical Exam: Vitals:   03/04/24 0742 03/04/24 1144  BP: (!) 186/89 (!) 172/87  Pulse: 80 75  Resp:  20  Temp: 97.9 F (36.6 C) 98.1 F (36.7 C)  SpO2: 97% 93%     General exam: Appears calm and comfortable  Respiratory system: Clear to auscultation. Respiratory effort normal. No wheezing or crackle Cardiovascular system: S1 & S2 heard,  RRR.  No pedal edema. Gastrointestinal system: Abdomen is nondistended, soft and nontender. Normal bowel sounds heard.  No allograft tenderness. Central nervous system: Alert and oriented. No focal neurological deficits. Extremities: Symmetric 5 x 5 power. Skin: No rashes, lesions or ulcers Psychiatry: Judgement and insight appear normal. Mood & affect appropriate.   Assessment/Plan:  # Presumed renal transplant pyelonephritis: Patient presented with fever and CT scan finding of possible pyelo-.  Chronic mild hydronephrosis is unchanged from before.  Urine showed proteinuria but no UTI.  Pending blood cultures.  Agree with continuing antibiotics and ID consult.  Continue supportive care.  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  Continuing IV fluid and monitor lab.  # Kidney transplant DDRT in 2003 with baseline CKD/immunosuppression: Continue Prograf  at home dose.  No need for stress dose steroid.  # Hypertension: Resume home medication, monitor BP.  Avoid hypotensive episode.  Thank you for the consult, we will continue to follow.   Gwenna Fuston Amelie Romney 03/04/2024, 2:30 PM  [employer].

## 2024-03-04 NOTE — Plan of Care (Signed)

## 2024-03-04 NOTE — Inpatient Diabetes Management (Addendum)
 Inpatient Diabetes Program Recommendations  AACE/ADA: New Consensus Statement on Inpatient Glycemic Control (2015)  Target Ranges:  Prepandial:   less than 140 mg/dL      Peak postprandial:   less than 180 mg/dL (1-2 hours)      Critically ill patients:  140 - 180 mg/dL   Lab Results  Component Value Date   GLUCAP 136 (H) 03/04/2024   HGBA1C 6.4 (H) 11/11/2023    Review of Glycemic Control  Diabetes history: type 1 Outpatient Diabetes medications:  Current orders for Inpatient glycemic control: insulin  pump  Inpatient Diabetes Program Recommendations:   Spoke with patient at the bedside. States that he changed his insulin  pump site last night. Reminded the staff RN to follow up with patient contract signed by patient and to answer questions about the pump on the flowsheet under insulin  pump management per shift.  Patient is very knowledgeable on how to control his diabetes. States that his last A1C was around 7%. Endocrinologist is Dr. KYM Lands.    Pump settings: (July 19,2025) patient states that these rates have not changed.  - basal rates: 12 am: 0.8 >> 0.85 5 am: 0.8 >> 0.75 1 pm: 1 3 pm: 0.9 >> 1 6 pm: 1.1 >> 1.2 >>  1.0 - ICR  12 am: 1:7 >> 8  11 am: 1:7  >> 8 - target: 110-120 >> 110-140 - ISF: 12 am: 50 8 pm: 70 - active insulin  time: 4h TDD from basal insulin : ~11 >> 17-19 units/day >> 57% >> 47% (18.5 units) >> 69% (22.1 units) TDD from bolus insulin : ~21 units >> 4-14 units/day >> 43% >> 53% >> 31%.  9.9 units) Total daily dose 35-60 >> 32-60 units a day   Will continue to monitor blood sugars while in the hospital.  Marjorie Lunger RN BSN CDE Diabetes Coordinator Pager: [phone]  8am-5pm

## 2024-03-04 NOTE — Progress Notes (Signed)
 PROGRESS NOTE                                                                                                                                                                                                             Patient Demographics:    Daniel Hill, is a 48 y.o. male, DOB - 1975/06/23, FMW:[id number]  Outpatient Primary MD for the patient is Thurmond Cathlyn LABOR., MD    LOS - 0  Admit date - 03/03/2024    Chief Complaint  Patient presents with   Chest Pain       Brief Narrative (HPI from H&P)   Daniel Hill is a 48 y.o. male with medical history significant of ESRD status post renal transplant now CKD, status post failed 3 pancreatic transplants, diabetes type 1, hypothyroidism, GERD, CAD, anxiety, depression, OSA, obesity, glaucoma presenting with chest pain, nausea, vomiting.   Patient reportedly noted nausea and vomiting starting overnight which she does have at times in the setting of gastroparesis.  This was followed by burning sensation in his chest that he initially attributed to GERD however the pain was persistent and began to worsen and radiate to his abdomen and shoulder.  Took aspirin  and nitro at home with some improvement.  EMS was called and patient initially was febrile to 100.3.   Subjective:    Daniel Hill today has, No headache, No chest pain, resolved abdominal pain no nausea, No new weakness tingling or numbness, no shortness of breath   Assessment  & Plan :   Hydronephrosis Pyelonephritis/infected renal transplant > Patient presented with chest pain radiating to abdomen and shoulder and persistent nausea. Found to have evidence of infected renal transplant with stranding about the renal transplant and dome of the bladder on CT.  Leukocytosis to 17.6.  Fever for EMS to 100.3 temperature in the ED 99.4. > He has suspected infection to the renal transplant site, has been placed on empiric  antibiotics, has been hydrated, blood cultures pending, currently significantly improved, afebrile with no nausea and no abdominal pain.  Nephrology and ID to see.   Chest pain at home prior to admission and in the ER at the time of admission, clear radiation from his abdominal discomfort and nausea History of CAD > Known CAD with cath in July of this year with moderate two-vessel disease with recommendation  for medical therapy. > EKG had questionable T wave depression in anterior/septal leads, however troponin negative x 2. - All symptoms resolved Continue home aspirin , prasugrel ,-metoprolol , Imdur , follow-up with cardiology postdischarge   Hypertension.  On combination of beta-blocker, Imdur  and Norvasc , add as needed hydralazine  and monitor.    CKD 3 Status post renal transplant > History of ESRD, now status post bilateral nephrectomy and renal transplant. > Remains on tacrolimus .  Last followed with [facility] last year.  Transplant was 2003.  Follows with Dr. DOROTHA Blanch locally. > Renal function close to baseline nephrology consulted will follow   Hypothyroidism - Continue Synthroid    GERD - Continue PPI  Status post failed pancreatic transplant Type 1 diabetes > Continue tacrolimus  - Continue insulin  pump with glucose monitoring  Lab Results  Component Value Date   HGBA1C 6.4 (H) 11/11/2023    CBG (last 3)  Recent Labs    03/03/24 2003 03/04/24 0305  GLUCAP 279* 178*           Condition -guarded  Family Communication  : Wife bedside  Code Status : Full code  Consults  : ID, nephrology  PUD Prophylaxis : PPI   Procedures  :     CT - 1. Left lower quadrant renal graft with unchanged mild hydronephrosis and new perinephric soft tissue stranding. Correlate for any clinical signs or symptoms of renal graft infection 2. Mild stranding around the dome of the bladder, which may reflect underlying cystitis      Disposition Plan  :    Status is:  Observation  DVT Prophylaxis  :    heparin  injection 5,000 Units Start: 03/03/24 2200    Lab Results  Component Value Date   PLT 293 03/04/2024    Diet :  Diet Order             Diet Carb Modified Fluid consistency: Thin; Room service appropriate? Yes; Fluid restriction: 2000 mL Fluid  Diet effective now                    Inpatient Medications  Scheduled Meds:  amLODipine   10 mg Oral Daily   aspirin  EC  81 mg Oral Daily   heparin   5,000 Units Subcutaneous Q8H   insulin  pump   Subcutaneous TID WC, HS, 0200   isosorbide  mononitrate  60 mg Oral Daily   levothyroxine   75 mcg Oral Q0600   metoprolol  succinate  50 mg Oral Daily   pantoprazole   40 mg Oral Daily   prasugrel   10 mg Oral Daily   scopolamine  1 patch Transdermal Once   sodium chloride  flush  3 mL Intravenous Q12H   tacrolimus   1 mg Oral QHS   tacrolimus   2 mg Oral q AM   Continuous Infusions:  piperacillin-tazobactam 3.375 g (03/04/24 0522)   promethazine  (PHENERGAN ) injection (IM or IVPB) Stopped (03/03/24 1515)   PRN Meds:.acetaminophen  **OR** acetaminophen , labetalol , polyethylene glycol, promethazine  (PHENERGAN ) injection (IM or IVPB), traMADol  Antibiotics  :    Anti-infectives (From admission, onward)    Start     Dose/Rate Route Frequency Ordered Stop   03/03/24 2100  piperacillin-tazobactam (ZOSYN) IVPB 3.375 g       Placed in Followed by Linked Group   3.375 g 12.5 mL/hr over 240 Minutes Intravenous Every 8 hours 03/03/24 1322     03/03/24 1330  piperacillin-tazobactam (ZOSYN) IVPB 3.375 g       Placed in Followed by Linked Group   3.375  g 100 mL/hr over 30 Minutes Intravenous  Once 03/03/24 1322 03/03/24 1515         Objective:   Vitals:   03/03/24 1947 03/03/24 2000 03/03/24 2335 03/04/24 0400  BP:  (!) 157/78 (!) 166/82 (!) 161/75  Pulse:  85 78 77  Resp:  12  (!) 21  Temp: 99.2 F (37.3 C)  98.3 F (36.8 C) 98.2 F (36.8 C)  TempSrc: Oral  Oral Oral  SpO2:  97% 96%  94%    Wt Readings from Last 3 Encounters:  12/05/23 96.5 kg  11/24/23 92.5 kg  11/13/23 95.3 kg     Intake/Output Summary (Last 24 hours) at 03/04/2024 0730 Last data filed at 03/04/2024 0522 Gross per 24 hour  Intake --  Output 1050 ml  Net -1050 ml     Physical Exam  Awake Alert, No new F.N deficits, Normal affect [place].AT,PERRAL Supple Neck, No JVD,   Symmetrical Chest wall movement, Good air movement bilaterally, CTAB RRR,No Gallops,Rubs or new Murmurs,  +ve B.Sounds, Abd Soft, No tenderness,   No Cyanosis, Clubbing or edema     RN pressure injury documentation:      Data Review:    Recent Labs  Lab 03/03/24 0934 03/04/24 0352  WBC 17.6* 20.6*  HGB 11.5* 10.7*  HCT 34.9* 32.5*  PLT 329 293  MCV 91.4 91.0  MCH 30.1 30.0  MCHC 33.0 32.9  RDW 13.9 14.0    Recent Labs  Lab 03/03/24 0934 03/03/24 1031 03/03/24 1055 03/04/24 0352  NA 139  --   --  138  K 4.4  --   --  4.5  CL 112*  --   --  110  CO2 18*  --   --  20*  ANIONGAP 9  --   --  8  GLUCOSE 217*  --   --  183*  BUN 38*  --   --  43*  CREATININE 2.38*  --   --  2.83*  AST  --  20  --  26  ALT  --  19  --  24  ALKPHOS  --  88  --  82  BILITOT  --  0.4  --  0.4  ALBUMIN  --  3.0*  --  2.4*  LATICACIDVEN  --   --  0.6  --   BNP  --  434.5*  --   --   CALCIUM  8.9  --   --  9.1      Recent Labs  Lab 03/03/24 0934 03/03/24 1031 03/03/24 1055 03/04/24 0352  LATICACIDVEN  --   --  0.6  --   BNP  --  434.5*  --   --   CALCIUM  8.9  --   --  9.1    --------------------------------------------------------------------------------------------------------------- Lab Results  Component Value Date   CHOL 165 11/11/2023   HDL 54 11/11/2023   LDLCALC 104 (H) 11/11/2023   TRIG 35 11/11/2023   CHOLHDL 3.1 11/11/2023    Lab Results  Component Value Date   HGBA1C 6.4 (H) 11/11/2023   No results for input(s): TSH, T4TOTAL, FREET4, T3FREE, THYROIDAB in the last 72 hours. No  results for input(s): VITAMINB12, FOLATE, FERRITIN, TIBC, IRON, RETICCTPCT in the last 72 hours. ------------------------------------------------------------------------------------------------------------------ Cardiac Enzymes No results for input(s): CKMB, TROPONINI, MYOGLOBIN in the last 168 hours.  Invalid input(s): CK  Micro Results Recent Results (from the past 240 hours)  Blood Culture (routine x 2)     Status:  None (Preliminary result)   Collection Time: 03/03/24 10:32 AM   Specimen: BLOOD RIGHT HAND  Result Value Ref Range Status   Specimen Description BLOOD RIGHT HAND  Final   Special Requests   Final    BOTTLES DRAWN AEROBIC AND ANAEROBIC Blood Culture adequate volume   Culture   Final    NO GROWTH < 24 HOURS Performed at [facility], [address], [place], [place] [address]    Report Status PENDING  Incomplete  Blood Culture (routine x 2)     Status: None (Preliminary result)   Collection Time: 03/03/24 10:42 AM   Specimen: BLOOD RIGHT HAND  Result Value Ref Range Status   Specimen Description BLOOD RIGHT HAND  Final   Special Requests   Final    BOTTLES DRAWN AEROBIC AND ANAEROBIC Blood Culture adequate volume   Culture   Final    NO GROWTH < 24 HOURS Performed at [facility], [address], [place], [place] [address]    Report Status PENDING  Incomplete  Resp panel by RT-PCR (RSV, Flu A&B, Covid) Anterior Nasal Swab     Status: None   Collection Time: 03/03/24 11:29 AM   Specimen: Anterior Nasal Swab  Result Value Ref Range Status   SARS Coronavirus 2 by RT PCR NEGATIVE NEGATIVE Final   Influenza A by PCR NEGATIVE NEGATIVE Final   Influenza B by PCR NEGATIVE NEGATIVE Final    Comment: (NOTE) The Xpert Xpress SARS-CoV-2/FLU/RSV plus assay is intended as an aid in the diagnosis of influenza from Nasopharyngeal swab specimens and should not be used as a sole basis for treatment. Nasal washings and aspirates are unacceptable  for Xpert Xpress SARS-CoV-2/FLU/RSV testing.  Fact Sheet for Patients: bloggercourse.com  Fact Sheet for Healthcare Providers: seriousbroker.it  This test is not yet approved or cleared by the [place]  FDA and has been authorized for detection and/or diagnosis of SARS-CoV-2 by FDA under an Emergency Use Authorization (EUA). This EUA will remain in effect (meaning this test can be used) for the duration of the COVID-19 declaration under Section 564(b)(1) of the Act, 21 U.S.C. section 360bbb-3(b)(1), unless the authorization is terminated or revoked.     Resp Syncytial Virus by PCR NEGATIVE NEGATIVE Final    Comment: (NOTE) Fact Sheet for Patients: bloggercourse.com  Fact Sheet for Healthcare Providers: seriousbroker.it  This test is not yet approved or cleared by the [place]  FDA and has been authorized for detection and/or diagnosis of SARS-CoV-2 by FDA under an Emergency Use Authorization (EUA). This EUA will remain in effect (meaning this test can be used) for the duration of the COVID-19 declaration under Section 564(b)(1) of the Act, 21 U.S.C. section 360bbb-3(b)(1), unless the authorization is terminated or revoked.  Performed at [facility], [address], [place], [place] [address]     Radiology Report CT CHEST ABDOMEN PELVIS WO CONTRAST Result Date: 03/03/2024 EXAM: CT CHEST, ABDOMEN AND PELVIS WITHOUT CONTRAST 03/03/2024 11:45:17 AM TECHNIQUE: CT of the chest, abdomen and pelvis was performed without the administration of intravenous contrast. Multiplanar reformatted images are provided for review. Automated exposure control, iterative reconstruction, and/or weight based adjustment of the mA/kV was utilized to reduce the radiation dose to as low as reasonably achievable. COMPARISON: 06/27/2016 CLINICAL HISTORY: low grade fever, elevated WBC count,  nausea/vomiting/chest pain, h/o pancreas / kidney transplant FINDINGS: CHEST: MEDIASTINUM AND LYMPH NODES: Heart and pericardium are unremarkable. Coronary artery calcifications. The central airways are clear. No mediastinal,  hilar or axillary lymphadenopathy. LUNGS AND PLEURA: Mild scarring within the posterior right lung base. No focal consolidation or pulmonary edema. No pleural effusion or pneumothorax. No signs of interstitial edema. ABDOMEN AND PELVIS: LIVER: The liver is unremarkable. GALLBLADDER AND BILE DUCTS: Gallbladder is unremarkable. No biliary ductal dilatation. SPLEEN: The spleen is within normal limits in size and appearance. PANCREAS: The pancreas appears diffusely atrophic. No main duct dilatation, inflammation, or mass. ADRENAL GLANDS: Normal size and morphology bilaterally. No nodule, thickening, or hemorrhage. No periadrenal stranding. KIDNEYS, URETERS AND BLADDER: Status post bilateral nephrectomy. Left lower quadrant renal graft with unchanged mild hydronephrosis but new perinephric soft tissue stranding. Mild stranding around the dome of the bladder is also noted. No stones in the kidneys or ureters. GI AND BOWEL: Stomach demonstrates no acute abnormality. No signs of bowel inflammation or distention. Moderate stool burden identified within the cecum. There is no bowel obstruction. REPRODUCTIVE ORGANS: The prostate gland appears normal. PERITONEUM AND RETROPERITONEUM: No ascites. No free air. VASCULATURE: Aorta is normal in caliber. ABDOMINAL AND PELVIS LYMPH NODES: Prominent mesenteric and retroperitoneal lymph nodes which appears stable from the prior exam. Left index periaortic lymph node is stable measuring 1 cm, image 76/3. BONES AND SOFT TISSUES: Multilevel thoracic degenerative disc disease. No acute or suspicious osseous abnormality. No focal soft tissue abnormality. IMPRESSION: 1. Left lower quadrant renal graft with unchanged mild hydronephrosis and new perinephric soft tissue  stranding. Correlate for any clinical signs or symptoms of renal graft infection 2. Mild stranding around the dome of the bladder, which may reflect underlying cystitis. Electronically signed by: Waddell Calk MD 03/03/2024 12:18 PM EST RP Workstation: [id number]   DG Chest 2 View Result Date: 03/03/2024 EXAM: 2 VIEW(S) XRAY OF THE CHEST 03/03/2024 10:19:45 AM COMPARISON: 11/11/2023 CLINICAL HISTORY: chest pain FINDINGS: LUNGS AND PLEURA: No focal pulmonary opacity. No pulmonary edema. No pleural effusion. No pneumothorax. HEART AND MEDIASTINUM: No acute abnormality of the cardiac and mediastinal silhouettes. BONES AND SOFT TISSUES: No acute osseous abnormality. IMPRESSION: 1. No acute cardiopulmonary process. Electronically signed by: Waddell Calk MD 03/03/2024 10:37 AM EST RP Workstation: [id number]     Signature  -   Lavada Stank M.D on 03/04/2024 at 7:30 AM   -  To page go to [email or web address]

## 2024-03-04 NOTE — Progress Notes (Signed)
Patient placed himself on home cpap unit for the night.

## 2024-03-05 DIAGNOSIS — Z9483 Pancreas transplant status: Secondary | ICD-10-CM | POA: Diagnosis not present

## 2024-03-05 DIAGNOSIS — Z940 Kidney transplant status: Secondary | ICD-10-CM | POA: Diagnosis not present

## 2024-03-05 DIAGNOSIS — N12 Tubulo-interstitial nephritis, not specified as acute or chronic: Secondary | ICD-10-CM | POA: Diagnosis not present

## 2024-03-05 LAB — COMPREHENSIVE METABOLIC PANEL WITH GFR
ALT: 22 U/L (ref 0–44)
AST: 23 U/L (ref 15–41)
Albumin: 2.7 g/dL — ABNORMAL LOW (ref 3.5–5.0)
Alkaline Phosphatase: 72 U/L (ref 38–126)
Anion gap: 7 (ref 5–15)
BUN: 44 mg/dL — ABNORMAL HIGH (ref 6–20)
CO2: 20 mmol/L — ABNORMAL LOW (ref 22–32)
Calcium: 9.1 mg/dL (ref 8.9–10.3)
Chloride: 111 mmol/L (ref 98–111)
Creatinine, Ser: 3.08 mg/dL — ABNORMAL HIGH (ref 0.61–1.24)
GFR, Estimated: 24 mL/min — ABNORMAL LOW (ref >60)
Glucose, Bld: 161 mg/dL — ABNORMAL HIGH (ref 70–99)
Potassium: 4.3 mmol/L (ref 3.5–5.1)
Sodium: 138 mmol/L (ref 135–145)
Total Bilirubin: 0.3 mg/dL (ref 0.0–1.2)
Total Protein: 5.9 g/dL — ABNORMAL LOW (ref 6.5–8.1)

## 2024-03-05 LAB — CBC WITH DIFFERENTIAL/PLATELET
Abs Immature Granulocytes: 0.05 K/uL (ref 0.00–0.07)
Basophils Absolute: 0.1 K/uL (ref 0.0–0.1)
Basophils Relative: 1 %
Eosinophils Absolute: 0.3 K/uL (ref 0.0–0.5)
Eosinophils Relative: 2 %
HCT: 35.4 % — ABNORMAL LOW (ref 39.0–52.0)
Hemoglobin: 11.7 g/dL — ABNORMAL LOW (ref 13.0–17.0)
Immature Granulocytes: 0 %
Lymphocytes Relative: 22 %
Lymphs Abs: 2.9 K/uL (ref 0.7–4.0)
MCH: 30.2 pg (ref 26.0–34.0)
MCHC: 33.1 g/dL (ref 30.0–36.0)
MCV: 91.2 fL (ref 80.0–100.0)
Monocytes Absolute: 1.5 K/uL — ABNORMAL HIGH (ref 0.1–1.0)
Monocytes Relative: 11 %
Neutro Abs: 8.7 K/uL — ABNORMAL HIGH (ref 1.7–7.7)
Neutrophils Relative %: 64 %
Platelets: 323 K/uL (ref 150–400)
RBC: 3.88 MIL/uL — ABNORMAL LOW (ref 4.22–5.81)
RDW: 14.1 % (ref 11.5–15.5)
WBC: 13.6 K/uL — ABNORMAL HIGH (ref 4.0–10.5)
nRBC: 0 % (ref 0.0–0.2)

## 2024-03-05 LAB — GLUCOSE, CAPILLARY
Glucose-Capillary: 162 mg/dL — ABNORMAL HIGH (ref 70–99)
Glucose-Capillary: 155 mg/dL — ABNORMAL HIGH (ref 70–99)
Glucose-Capillary: 233 mg/dL — ABNORMAL HIGH (ref 70–99)
Glucose-Capillary: 198 mg/dL — ABNORMAL HIGH (ref 70–99)
Glucose-Capillary: 287 mg/dL — ABNORMAL HIGH (ref 70–99)
Glucose-Capillary: 279 mg/dL — ABNORMAL HIGH (ref 70–99)

## 2024-03-05 LAB — C-REACTIVE PROTEIN: CRP: 5.7 mg/dL — ABNORMAL HIGH (ref <1.0)

## 2024-03-05 LAB — MAGNESIUM: Magnesium: 2.3 mg/dL (ref 1.7–2.4)

## 2024-03-05 MED ORDER — OXYMETAZOLINE HCL 0.05 % NA SOLN
1.0000 | Freq: Two times a day (BID) | NASAL | Status: DC
Start: 2024-03-05 — End: 2024-03-08
  Administered 2024-03-05: 1 via NASAL
  Filled 2024-03-05: qty 30

## 2024-03-05 MED ORDER — ALUM & MAG HYDROXIDE-SIMETH 200-200-20 MG/5ML PO SUSP
30.0000 mL | ORAL | Status: DC | PRN
  Administered 2024-03-05: 30 mL via ORAL
  Filled 2024-03-05: qty 30

## 2024-03-05 MED ORDER — SALINE SPRAY 0.65 % NA SOLN
2.0000 | NASAL | Status: DC
  Administered 2024-03-05 – 2024-03-06 (×3): 2 via NASAL
  Filled 2024-03-05: qty 44

## 2024-03-05 MED ORDER — HEPARIN SODIUM (PORCINE) 5000 UNIT/ML IJ SOLN
5000.0000 [IU] | Freq: Three times a day (TID) | INTRAMUSCULAR | Status: DC
  Filled 2024-03-05: qty 1

## 2024-03-05 NOTE — Progress Notes (Signed)
 Patient had an episode of continuous nasal bleed for around half an hour.MD paged.Packed with afrin soaked gauze which eventually stopped bleeding.

## 2024-03-05 NOTE — Progress Notes (Signed)
 [facility]  Date of Admission:  03/03/2024     Reason for Follow Up: Pyelonephritis  Total days of antibiotics 3         ASSESSMENT:  Daniel Hill is a 48 year old Caucasian male with history of renal transplant and multiple pancreatic transplants presenting with nausea, vomiting, and chest pain and imaging concerning for possible transplant graft infection and cystitis.   Feeling better and able to eat more with no additional nausea. Has current nosebleed and applying direct pressure. Discussed plan of care to continue antibiotics through tomorrow with no clear source of bacterial infection at this point as blood cultures remain without growth and white blood cell count is improving. Anticipate likely be able to stop antibiotics tomorrow. Renal transplant care per Nephrology and remaining medical and supportive care per Internal Medicine.   PLAN:  Continue current dose of piperacillin-tazobactam. Monitor blood cultures for bacteremia.  Monitor fever curve and WBC count.  Renal transplant care per Nephrology. Remaining medical and supportive care per Internal Medicine.   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   Mild episode of recurrent major depressive disorder   OSA on CPAP    amLODipine   10 mg Oral Daily   aspirin  EC  81 mg Oral Daily   [START ON 03/06/2024] heparin   5,000 Units Subcutaneous Q8H   insulin  pump   Subcutaneous TID WC, HS, 0200   isosorbide  mononitrate   60 mg Oral Daily   levothyroxine   75 mcg Oral Q0600   metoprolol  succinate  50 mg Oral Daily   oxymetazoline  1 spray Each Nare BID   pantoprazole   40 mg Oral Daily   prasugrel   10 mg Oral Daily   scopolamine  1 patch Transdermal Once   sodium chloride   2 spray Each Nare Q4H   sodium chloride  flush  3 mL Intravenous Q12H   tacrolimus   1 mg Oral QHS   tacrolimus   2 mg Oral q AM    SUBJECTIVE:  Afebrile overnight with no acute events. Tolerating antibiotics with no adverse side effects. Nausea improved with increased appetite. Has a nose bleed currently.   Allergies  Allergen Reactions   Other Nausea And Vomiting    RAW Canteloupe, banana, tomatoes, all raw vegetables  Unless the vegetables have dressing on them.  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   Codeine Nausea And Vomiting   Metoclopramide Hcl Nausea And Vomiting     Review of Systems: Review of Systems  Constitutional:  Negative for chills, fever and weight loss.  HENT:  Positive for nosebleeds.   Respiratory:  Negative for cough, shortness of breath and wheezing.   Cardiovascular:  Negative for chest pain and leg swelling.  Gastrointestinal:  Negative for abdominal pain, constipation, diarrhea, nausea and vomiting.  Skin:  Negative for rash.      OBJECTIVE: Vitals:   03/05/24 0355 03/05/24 0700 03/05/24 0855 03/05/24 1100  BP: (!) 156/90 (!) 162/82 (!) 163/88 (!) 150/83  Pulse: 84  Resp: 19  13   Temp: 98.5 F (36.9 C) 98.6 F (37 C)  98.5 F (36.9 C)  TempSrc: Oral Oral  Oral  SpO2: 95%     Weight:      Height:       Body mass index is 29.98 kg/m.  Physical Exam Constitutional:      General: He is not in acute distress.    Appearance: He is well-developed.  Cardiovascular:     Rate and Rhythm: Normal rate and regular rhythm.     Heart sounds: Normal heart sounds.  Pulmonary:     Effort: Pulmonary effort is normal.     Breath sounds: Normal breath sounds.  Skin:    General:  Skin is warm and dry.  Neurological:     Mental Status: He is alert and oriented to person, place, and time.     Lab Results Lab Results  Component Value Date   WBC 13.6 (H) 03/05/2024   HGB 11.7 (L) 03/05/2024   HCT 35.4 (L) 03/05/2024   MCV 91.2 03/05/2024   PLT 323 03/05/2024    Lab Results  Component Value Date   CREATININE 3.08 (H) 03/05/2024   BUN 44 (H) 03/05/2024   NA 138 03/05/2024   K 4.3 03/05/2024   CL 111 03/05/2024   CO2 20 (L) 03/05/2024    Lab Results  Component Value Date   ALT 22 03/05/2024   AST 23 03/05/2024   ALKPHOS 72 03/05/2024   BILITOT 0.3 03/05/2024     Microbiology: Recent Results (from the past 240 hours)  Blood Culture (routine x 2)     Status: None (Preliminary result)   Collection Time: 03/03/24 10:32 AM   Specimen: BLOOD RIGHT HAND  Result Value Ref Range Status   Specimen Description BLOOD RIGHT HAND  Final   Special Requests   Final    BOTTLES DRAWN AEROBIC AND ANAEROBIC Blood Culture adequate volume   Culture   Final    NO GROWTH 2 DAYS Performed at [facility], [address] [place], [place] [address]    Report Status PENDING  Incomplete  Blood Culture (routine x 2)     Status: None (Preliminary result)   Collection Time: 03/03/24 10:42 AM   Specimen: BLOOD RIGHT HAND  Result Value Ref Range Status   Specimen Description BLOOD RIGHT HAND  Final   Special Requests   Final    BOTTLES DRAWN AEROBIC AND ANAEROBIC Blood Culture adequate volume   Culture   Final    NO GROWTH 2 DAYS Performed at [facility], [address], [place], [place] [address]    Report Status PENDING  Incomplete  Resp panel by RT-PCR (RSV, Flu A&B, Covid) Anterior Nasal Swab     Status: None   Collection Time: 03/03/24 11:29 AM   Specimen: Anterior Nasal Swab  Result Value Ref Range Status   SARS Coronavirus 2 by RT PCR NEGATIVE NEGATIVE Final   Influenza A by PCR NEGATIVE NEGATIVE Final   Influenza B by PCR NEGATIVE NEGATIVE Final     Comment: (NOTE) The Xpert Xpress SARS-CoV-2/FLU/RSV plus assay is intended as an aid in the diagnosis of influenza from Nasopharyngeal swab specimens and should not be used as a sole basis for treatment. Nasal washings and aspirates are unacceptable for Xpert Xpress SARS-CoV-2/FLU/RSV testing.  Fact Sheet for Patients: bloggercourse.com  Fact Sheet for Healthcare Providers: seriousbroker.it  This test is not yet approved or cleared by the [place]  [place] FDA and has been authorized for detection and/or diagnosis of SARS-CoV-2 by FDA under an Emergency Use Authorization (EUA). This EUA will remain in effect (meaning this test can be used) for the duration of the COVID-19 declaration under Section 564(b)(1) of the Act, 21 U.S.C. section 360bbb-3(b)(1), unless the authorization is terminated or revoked.     Resp Syncytial Virus by PCR NEGATIVE NEGATIVE Final    Comment: (NOTE) Fact Sheet for Patients: bloggercourse.com  Fact Sheet for Healthcare Providers: seriousbroker.it  This test is not yet approved or cleared by the [place]  FDA and has been authorized for detection and/or diagnosis of SARS-CoV-2 by FDA under an Emergency Use Authorization (EUA). This EUA will remain in effect (meaning this test can be used) for the duration of the COVID-19 declaration under Section 564(b)(1) of the Act, 21 U.S.C. section 360bbb-3(b)(1), unless the authorization is terminated or revoked.  Performed at [facility], [address] [place], [place] [address]      Cathlyn July, NP [facility]  03/05/2024  3:13 PM

## 2024-03-05 NOTE — Progress Notes (Signed)
 [facility] NEPHROLOGY PROGRESS NOTE  Assessment/ Plan: Pt is a 48 y.o. 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   # Suspected renal transplant infection: Patient presented with fever and CT scan finding of possible pyelo-.  Chronic mild hydronephrosis is unchanged from before.  Urine showed proteinuria but no UTI, ? Viral infection. Pending blood cultures.  Agree with continuing antibiotics and ID consult.  Continue supportive care.   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  Urine output is 2.3 L.  Noted uptrending of creatinine level but overall clinically doing well.  No further need of IV fluid since he can take orally.  Monitor lab and urine output.   # Kidney transplant DDRT in 2003 with baseline CKD/immunosuppression: Continue Prograf  at home dose.  No need for stress dose steroid.   # Hypertension: Resume home medication, monitor BP.  Avoid hypotensive episode.   Subjective: Seen and examined at the bedside.  He has urine output around 2.3 L in 24 hours.  No event overnight.  Uncomfortable bed but denies nausea, vomiting, chest pain or shortness of breath.  He was afebrile. Objective Vital signs in last 24 hours: Vitals:   03/05/24 0121 03/05/24 0355 03/05/24 0700 03/05/24 0855  BP: (!) 163/98 (!) 156/90 (!) 162/82 (!) 163/88  Pulse: 78 84    Resp: 14 19  13   Temp: 98.6 F (37 C) 98.5 F (36.9 C) 98.6 F (37 C)   TempSrc: Oral Oral Oral   SpO2: 98% 95%    Weight:      Height:       Weight change:   Intake/Output Summary (Last 24 hours) at 03/05/2024 1157 Last data filed at 03/05/2024 1119 Gross per  24 hour  Intake 1292.88 ml  Output 2250 ml  Net -957.12 ml       Labs: RENAL PANEL Recent Labs  Lab 03/03/24 0934 03/03/24 1031 03/04/24 0352 03/05/24 0354  NA 139  --  138 138  K 4.4  --  4.5 4.3  CL 112*  --  110 111  CO2 18*  --  20* 20*  GLUCOSE 217*  --  183* 161*  BUN 38*  --  43* 44*  CREATININE 2.38*  --  2.83* 3.08*  CALCIUM  8.9  --  9.1 9.1  MG  --   --   --  2.3  ALBUMIN  --  3.0* 2.4* 2.7*    Liver Function Tests: Recent Labs  Lab 03/03/24 1031 03/04/24 0352 03/05/24 0354  AST 20 26 23   ALT 19 24 22   ALKPHOS 88 82 72  BILITOT 0.4 0.4 0.3  PROT 5.8* 5.5* 5.9*  ALBUMIN 3.0* 2.4* 2.7*   Recent Labs  Lab 03/03/24 0934  LIPASE 18   No results for input(s): AMMONIA in the last 168 hours. CBC: Recent Labs    11/13/23 1441 11/14/23 0240 03/03/24 0934 03/04/24 0352 03/05/24 0354  HGB 10.0* 9.0* 11.5* 10.7* 11.7*  MCV 95.6 95.8 91.4 91.0 91.2    Cardiac Enzymes: No results for input(s): CKTOTAL, CKMB, CKMBINDEX, TROPONINI in the last 168 hours. CBG: Recent Labs  Lab 03/04/24 1145 03/04/24 1606 03/04/24 2059 03/05/24 0126 03/05/24 0714  GLUCAP 161*  216* 247* 162* 155*    Iron Studies: No results for input(s): IRON, TIBC, TRANSFERRIN, FERRITIN in the last 72 hours. Studies/Results: No results found.  Medications: Infusions:  piperacillin-tazobactam 3.375 g (03/05/24 0439)   promethazine  (PHENERGAN ) injection (IM or IVPB) Stopped (03/04/24 2055)    Scheduled Medications:  amLODipine   10 mg Oral Daily   aspirin  EC  81 mg Oral Daily   heparin   5,000 Units Subcutaneous Q8H   insulin  pump   Subcutaneous TID WC, HS, 0200   isosorbide  mononitrate  60 mg Oral Daily   levothyroxine   75 mcg Oral Q0600   metoprolol  succinate  50 mg Oral Daily   pantoprazole   40 mg Oral Daily   prasugrel   10 mg Oral Daily   scopolamine  1 patch Transdermal Once   sodium chloride  flush  3 mL Intravenous Q12H   tacrolimus   1 mg Oral QHS    tacrolimus   2 mg Oral q AM    have reviewed scheduled and prn medications.  Physical Exam: General:NAD, comfortable Heart:RRR, s1s2 nl Lungs:clear b/l, no crackle Abdomen:soft, no allograft tenderness, non-distended Extremities:No edema Neurology: Alert awake and nonfocal.  Milliana Reddoch Amelie Romney 03/05/2024,11:57 AM  LOS: 1 day

## 2024-03-05 NOTE — Plan of Care (Signed)
  Problem: Clinical Measurements: Goal: Will remain free from infection Outcome: Progressing   Problem: Elimination: Goal: Will not experience complications related to urinary retention Outcome: Progressing   

## 2024-03-05 NOTE — Progress Notes (Signed)
 PROGRESS NOTE                                                                                                                                                                                                             Patient Demographics:    Daniel Hill, is a 48 y.o. male, DOB - Sep 06, 1975, FMW:[id number]  Outpatient Primary MD for the patient is Thurmond Cathlyn LABOR., MD    LOS - 1  Admit date - 03/03/2024    Chief Complaint  Patient presents with   Chest Pain       Brief Narrative (HPI from H&P)   Daniel Hill is a 48 y.o. male with medical history significant of ESRD status post renal transplant now CKD, status post failed 3 pancreatic transplants, diabetes type 1, hypothyroidism, GERD, CAD, anxiety, depression, OSA, obesity, glaucoma presenting with chest pain, nausea, vomiting.   Patient reportedly noted nausea and vomiting starting overnight which she does have at times in the setting of gastroparesis.  This was followed by burning sensation in his chest that he initially attributed to GERD however the pain was persistent and began to worsen and radiate to his abdomen and shoulder.  Took aspirin  and nitro at home with some improvement.  EMS was called and patient initially was febrile to 100.3.   Subjective:   Patient in bed, appears comfortable, denies any headache, no fever, no chest pain or pressure, no shortness of breath , no abdominal pain. No focal weakness.   Assessment  & Plan :   Hydronephrosis Pyelonephritis/infected renal transplant > Patient presented with chest pain radiating to abdomen and shoulder and persistent nausea. Found to have evidence of infected renal transplant with stranding about the renal transplant and dome of the bladder on CT.  Leukocytosis to 17.6.  Fever for EMS to 100.3 temperature in the ED 99.4. > He has suspected infection to the renal transplant site, has been placed on empiric  antibiotics, has been hydrated, blood cultures pending, currently significantly improved, afebrile with no nausea and no abdominal pain.  Nephrology and ID following, case discussed with nephrology on 03/05/2024.   Chest pain at home prior to admission and in the ER at the time of admission, clear radiation from his abdominal discomfort and nausea History of CAD > Known CAD with cath in July of this year  with moderate two-vessel disease with recommendation for medical therapy. > EKG had questionable T wave depression in anterior/septal leads, however troponin negative x 2. - All symptoms resolved Continue home aspirin , prasugrel ,-metoprolol , Imdur , follow-up with cardiology postdischarge   Hypertension.  On combination of beta-blocker, Imdur  and Norvasc , add as needed hydralazine  and monitor.    AKI on CKD 3 Status post renal transplant > History of ESRD, now status post bilateral nephrectomy and renal transplant. > Remains on tacrolimus .  Last followed with [facility] last year.  Transplant was 2003.  Follows with Dr. DOROTHA Blanch locally.  His baseline creatinine is close to 2, nephrology following AKI, discussed with nephrology on 03/05/2024 they will continue to monitor him closely.  AKI likely due to the acute infection.  Per nephrology no further IV hydration for now.     Hypothyroidism - Continue Synthroid    GERD - Continue PPI  Status post failed pancreatic transplant Type 1 diabetes > Continue tacrolimus  - Continue insulin  pump with glucose monitoring  Lab Results  Component Value Date   HGBA1C 6.4 (H) 11/11/2023    CBG (last 3)  Recent Labs    03/04/24 2059 03/05/24 0126 03/05/24 0714  GLUCAP 247* 162* 155*           Condition -guarded  Family Communication  : Wife bedside  Code Status : Full code  Consults  : ID, nephrology  PUD Prophylaxis : PPI   Procedures  :     CT - 1. Left lower quadrant renal graft with unchanged mild hydronephrosis  and new perinephric soft tissue stranding. Correlate for any clinical signs or symptoms of renal graft infection 2. Mild stranding around the dome of the bladder, which may reflect underlying cystitis      Disposition Plan  :    Status is: Observation  DVT Prophylaxis  :    heparin  injection 5,000 Units Start: 03/03/24 2200    Lab Results  Component Value Date   PLT 323 03/05/2024    Diet :  Diet Order             Diet Carb Modified Fluid consistency: Thin; Room service appropriate? Yes; Fluid restriction: 2000 mL Fluid  Diet effective now                    Inpatient Medications  Scheduled Meds:  amLODipine   10 mg Oral Daily   aspirin  EC  81 mg Oral Daily   heparin   5,000 Units Subcutaneous Q8H   insulin  pump   Subcutaneous TID WC, HS, 0200   isosorbide  mononitrate  60 mg Oral Daily   levothyroxine   75 mcg Oral Q0600   metoprolol  succinate  50 mg Oral Daily   pantoprazole   40 mg Oral Daily   prasugrel   10 mg Oral Daily   scopolamine  1 patch Transdermal Once   sodium chloride  flush  3 mL Intravenous Q12H   tacrolimus   1 mg Oral QHS   tacrolimus   2 mg Oral q AM   Continuous Infusions:  piperacillin-tazobactam 3.375 g (03/05/24 0439)   promethazine  (PHENERGAN ) injection (IM or IVPB) Stopped (03/04/24 2055)   PRN Meds:.acetaminophen  **OR** acetaminophen , alum & mag hydroxide-simeth, hydrALAZINE , labetalol , polyethylene glycol, promethazine  (PHENERGAN ) injection (IM or IVPB), traMADol  Antibiotics  :    Anti-infectives (From admission, onward)    Start     Dose/Rate Route Frequency Ordered Stop   03/03/24 2100  piperacillin-tazobactam (ZOSYN) IVPB 3.375 g  Placed in Followed by Linked Group   3.375 g 12.5 mL/hr over 240 Minutes Intravenous Every 8 hours 03/03/24 1322     03/03/24 1330  piperacillin-tazobactam (ZOSYN) IVPB 3.375 g       Placed in Followed by Linked Group   3.375 g 100 mL/hr over 30 Minutes Intravenous  Once 03/03/24 1322 03/03/24  1515         Objective:   Vitals:   03/04/24 2021 03/05/24 0121 03/05/24 0355 03/05/24 0700  BP: 139/80 (!) 163/98 (!) 156/90 (!) 162/82  Pulse: 84 78 84   Resp: 18 14 19    Temp: 98.7 F (37.1 C) 98.6 F (37 C) 98.5 F (36.9 C) 98.6 F (37 C)  TempSrc: Oral Oral Oral Oral  SpO2: 97% 98% 95%   Weight:      Height:        Wt Readings from Last 3 Encounters:  03/04/24 92.1 kg  12/05/23 96.5 kg  11/24/23 92.5 kg     Intake/Output Summary (Last 24 hours) at 03/05/2024 0842 Last data filed at 03/05/2024 0600 Gross per 24 hour  Intake 1292.88 ml  Output 1700 ml  Net -407.12 ml     Physical Exam  Awake Alert, No new F.N deficits, Normal affect [place].AT,PERRAL Supple Neck, No JVD,   Symmetrical Chest wall movement, Good air movement bilaterally, CTAB RRR,No Gallops,Rubs or new Murmurs,  +ve B.Sounds, Abd Soft, No tenderness,   No Cyanosis, Clubbing or edema       Data Review:    Recent Labs  Lab 03/03/24 0934 03/04/24 0352 03/05/24 0354  WBC 17.6* 20.6* 13.6*  HGB 11.5* 10.7* 11.7*  HCT 34.9* 32.5* 35.4*  PLT 329 293 323  MCV 91.4 91.0 91.2  MCH 30.1 30.0 30.2  MCHC 33.0 32.9 33.1  RDW 13.9 14.0 14.1  LYMPHSABS  --   --  2.9  MONOABS  --   --  1.5*  EOSABS  --   --  0.3  BASOSABS  --   --  0.1    Recent Labs  Lab 03/03/24 0934 03/03/24 1031 03/03/24 1055 03/04/24 0352 03/05/24 0354  NA 139  --   --  138 138  K 4.4  --   --  4.5 4.3  CL 112*  --   --  110 111  CO2 18*  --   --  20* 20*  ANIONGAP 9  --   --  8 7  GLUCOSE 217*  --   --  183* 161*  BUN 38*  --   --  43* 44*  CREATININE 2.38*  --   --  2.83* 3.08*  AST  --  20  --  26 23  ALT  --  19  --  24 22  ALKPHOS  --  88  --  82 72  BILITOT  --  0.4  --  0.4 0.3  ALBUMIN  --  3.0*  --  2.4* 2.7*  CRP  --   --   --  8.3* 5.7*  PROCALCITON  --   --   --  4.12  --   LATICACIDVEN  --   --  0.6  --   --   BNP  --  434.5*  --   --   --   MG  --   --   --   --  2.3  CALCIUM  8.9  --   --   9.1 9.1      Recent  Labs  Lab 03/03/24 0934 03/03/24 1031 03/03/24 1055 03/04/24 0352 03/05/24 0354  CRP  --   --   --  8.3* 5.7*  PROCALCITON  --   --   --  4.12  --   LATICACIDVEN  --   --  0.6  --   --   BNP  --  434.5*  --   --   --   MG  --   --   --   --  2.3  CALCIUM  8.9  --   --  9.1 9.1    --------------------------------------------------------------------------------------------------------------- Lab Results  Component Value Date   CHOL 165 11/11/2023   HDL 54 11/11/2023   LDLCALC 104 (H) 11/11/2023   TRIG 35 11/11/2023   CHOLHDL 3.1 11/11/2023    Lab Results  Component Value Date   HGBA1C 6.4 (H) 11/11/2023   No results for input(s): TSH, T4TOTAL, FREET4, T3FREE, THYROIDAB in the last 72 hours. No results for input(s): VITAMINB12, FOLATE, FERRITIN, TIBC, IRON, RETICCTPCT in the last 72 hours. ------------------------------------------------------------------------------------------------------------------ Cardiac Enzymes No results for input(s): CKMB, TROPONINI, MYOGLOBIN in the last 168 hours.  Invalid input(s): CK  Micro Results Recent Results (from the past 240 hours)  Blood Culture (routine x 2)     Status: None (Preliminary result)   Collection Time: 03/03/24 10:32 AM   Specimen: BLOOD RIGHT HAND  Result Value Ref Range Status   Specimen Description BLOOD RIGHT HAND  Final   Special Requests   Final    BOTTLES DRAWN AEROBIC AND ANAEROBIC Blood Culture adequate volume   Culture   Final    NO GROWTH < 24 HOURS Performed at [facility], [address] [place], [place] [address]    Report Status PENDING  Incomplete  Blood Culture (routine x 2)     Status: None (Preliminary result)   Collection Time: 03/03/24 10:42 AM   Specimen: BLOOD RIGHT HAND  Result Value Ref Range Status   Specimen Description BLOOD RIGHT HAND  Final   Special Requests   Final    BOTTLES DRAWN AEROBIC AND ANAEROBIC Blood Culture  adequate volume   Culture   Final    NO GROWTH < 24 HOURS Performed at [facility], [address], [place], [place] [address]    Report Status PENDING  Incomplete  Resp panel by RT-PCR (RSV, Flu A&B, Covid) Anterior Nasal Swab     Status: None   Collection Time: 03/03/24 11:29 AM   Specimen: Anterior Nasal Swab  Result Value Ref Range Status   SARS Coronavirus 2 by RT PCR NEGATIVE NEGATIVE Final   Influenza A by PCR NEGATIVE NEGATIVE Final   Influenza B by PCR NEGATIVE NEGATIVE Final    Comment: (NOTE) The Xpert Xpress SARS-CoV-2/FLU/RSV plus assay is intended as an aid in the diagnosis of influenza from Nasopharyngeal swab specimens and should not be used as a sole basis for treatment. Nasal washings and aspirates are unacceptable for Xpert Xpress SARS-CoV-2/FLU/RSV testing.  Fact Sheet for Patients: bloggercourse.com  Fact Sheet for Healthcare Providers: seriousbroker.it  This test is not yet approved or cleared by the [place]  FDA and has been authorized for detection and/or diagnosis of SARS-CoV-2 by FDA under an Emergency Use Authorization (EUA). This EUA will remain in effect (meaning this test can be used) for the duration of the COVID-19 declaration under Section 564(b)(1) of the Act, 21 U.S.C. section 360bbb-3(b)(1), unless the authorization is terminated or revoked.     Resp  Syncytial Virus by PCR NEGATIVE NEGATIVE Final    Comment: (NOTE) Fact Sheet for Patients: bloggercourse.com  Fact Sheet for Healthcare Providers: seriousbroker.it  This test is not yet approved or cleared by the [place]  FDA and has been authorized for detection and/or diagnosis of SARS-CoV-2 by FDA under an Emergency Use Authorization (EUA). This EUA will remain in effect (meaning this test can be used) for the duration of the COVID-19 declaration under Section 564(b)(1) of  the Act, 21 U.S.C. section 360bbb-3(b)(1), unless the authorization is terminated or revoked.  Performed at [facility], [address], [place], [place] [address]     Radiology Report CT CHEST ABDOMEN PELVIS WO CONTRAST Result Date: 03/03/2024 EXAM: CT CHEST, ABDOMEN AND PELVIS WITHOUT CONTRAST 03/03/2024 11:45:17 AM TECHNIQUE: CT of the chest, abdomen and pelvis was performed without the administration of intravenous contrast. Multiplanar reformatted images are provided for review. Automated exposure control, iterative reconstruction, and/or weight based adjustment of the mA/kV was utilized to reduce the radiation dose to as low as reasonably achievable. COMPARISON: 06/27/2016 CLINICAL HISTORY: low grade fever, elevated WBC count, nausea/vomiting/chest pain, h/o pancreas / kidney transplant FINDINGS: CHEST: MEDIASTINUM AND LYMPH NODES: Heart and pericardium are unremarkable. Coronary artery calcifications. The central airways are clear. No mediastinal, hilar or axillary lymphadenopathy. LUNGS AND PLEURA: Mild scarring within the posterior right lung base. No focal consolidation or pulmonary edema. No pleural effusion or pneumothorax. No signs of interstitial edema. ABDOMEN AND PELVIS: LIVER: The liver is unremarkable. GALLBLADDER AND BILE DUCTS: Gallbladder is unremarkable. No biliary ductal dilatation. SPLEEN: The spleen is within normal limits in size and appearance. PANCREAS: The pancreas appears diffusely atrophic. No main duct dilatation, inflammation, or mass. ADRENAL GLANDS: Normal size and morphology bilaterally. No nodule, thickening, or hemorrhage. No periadrenal stranding. KIDNEYS, URETERS AND BLADDER: Status post bilateral nephrectomy. Left lower quadrant renal graft with unchanged mild hydronephrosis but new perinephric soft tissue stranding. Mild stranding around the dome of the bladder is also noted. No stones in the kidneys or ureters. GI AND BOWEL: Stomach demonstrates no acute  abnormality. No signs of bowel inflammation or distention. Moderate stool burden identified within the cecum. There is no bowel obstruction. REPRODUCTIVE ORGANS: The prostate gland appears normal. PERITONEUM AND RETROPERITONEUM: No ascites. No free air. VASCULATURE: Aorta is normal in caliber. ABDOMINAL AND PELVIS LYMPH NODES: Prominent mesenteric and retroperitoneal lymph nodes which appears stable from the prior exam. Left index periaortic lymph node is stable measuring 1 cm, image 76/3. BONES AND SOFT TISSUES: Multilevel thoracic degenerative disc disease. No acute or suspicious osseous abnormality. No focal soft tissue abnormality. IMPRESSION: 1. Left lower quadrant renal graft with unchanged mild hydronephrosis and new perinephric soft tissue stranding. Correlate for any clinical signs or symptoms of renal graft infection 2. Mild stranding around the dome of the bladder, which may reflect underlying cystitis. Electronically signed by: Waddell Calk MD 03/03/2024 12:18 PM EST RP Workstation: [id number]   DG Chest 2 View Result Date: 03/03/2024 EXAM: 2 VIEW(S) XRAY OF THE CHEST 03/03/2024 10:19:45 AM COMPARISON: 11/11/2023 CLINICAL HISTORY: chest pain FINDINGS: LUNGS AND PLEURA: No focal pulmonary opacity. No pulmonary edema. No pleural effusion. No pneumothorax. HEART AND MEDIASTINUM: No acute abnormality of the cardiac and mediastinal silhouettes. BONES AND SOFT TISSUES: No acute osseous abnormality. IMPRESSION: 1. No acute cardiopulmonary process. Electronically signed by: Waddell Calk MD 03/03/2024 10:37 AM EST RP Workstation: [id number]     Signature  -   Lavada Stank M.D on 03/05/2024 at 8:42 AM   -  To page go to [email or web address]

## 2024-03-06 DIAGNOSIS — N12 Tubulo-interstitial nephritis, not specified as acute or chronic: Secondary | ICD-10-CM | POA: Diagnosis not present

## 2024-03-06 LAB — CBC WITH DIFFERENTIAL/PLATELET
Abs Immature Granulocytes: 0.05 K/uL (ref 0.00–0.07)
Basophils Absolute: 0.1 K/uL (ref 0.0–0.1)
Basophils Relative: 1 %
Eosinophils Absolute: 0.5 K/uL (ref 0.0–0.5)
Eosinophils Relative: 6 %
HCT: 31 % — ABNORMAL LOW (ref 39.0–52.0)
Hemoglobin: 10 g/dL — ABNORMAL LOW (ref 13.0–17.0)
Immature Granulocytes: 1 %
Lymphocytes Relative: 31 %
Lymphs Abs: 2.6 K/uL (ref 0.7–4.0)
MCH: 29.6 pg (ref 26.0–34.0)
MCHC: 32.3 g/dL (ref 30.0–36.0)
MCV: 91.7 fL (ref 80.0–100.0)
Monocytes Absolute: 1.2 K/uL — ABNORMAL HIGH (ref 0.1–1.0)
Monocytes Relative: 14 %
Neutro Abs: 4.1 K/uL (ref 1.7–7.7)
Neutrophils Relative %: 47 %
Platelets: 295 K/uL (ref 150–400)
RBC: 3.38 MIL/uL — ABNORMAL LOW (ref 4.22–5.81)
RDW: 13.8 % (ref 11.5–15.5)
WBC: 8.5 K/uL (ref 4.0–10.5)
nRBC: 0 % (ref 0.0–0.2)

## 2024-03-06 LAB — COMPREHENSIVE METABOLIC PANEL WITH GFR
ALT: 17 U/L (ref 0–44)
AST: 16 U/L (ref 15–41)
Albumin: 2.5 g/dL — ABNORMAL LOW (ref 3.5–5.0)
Alkaline Phosphatase: 60 U/L (ref 38–126)
Anion gap: 8 (ref 5–15)
BUN: 41 mg/dL — ABNORMAL HIGH (ref 6–20)
CO2: 21 mmol/L — ABNORMAL LOW (ref 22–32)
Calcium: 8.7 mg/dL — ABNORMAL LOW (ref 8.9–10.3)
Chloride: 112 mmol/L — ABNORMAL HIGH (ref 98–111)
Creatinine, Ser: 2.91 mg/dL — ABNORMAL HIGH (ref 0.61–1.24)
GFR, Estimated: 26 mL/min — ABNORMAL LOW (ref >60)
Glucose, Bld: 197 mg/dL — ABNORMAL HIGH (ref 70–99)
Potassium: 4.5 mmol/L (ref 3.5–5.1)
Sodium: 141 mmol/L (ref 135–145)
Total Bilirubin: 0.3 mg/dL (ref 0.0–1.2)
Total Protein: 5.4 g/dL — ABNORMAL LOW (ref 6.5–8.1)

## 2024-03-06 LAB — MAGNESIUM: Magnesium: 2.4 mg/dL (ref 1.7–2.4)

## 2024-03-06 LAB — GLUCOSE, CAPILLARY
Glucose-Capillary: 171 mg/dL — ABNORMAL HIGH (ref 70–99)
Glucose-Capillary: 246 mg/dL — ABNORMAL HIGH (ref 70–99)

## 2024-03-06 LAB — C-REACTIVE PROTEIN: CRP: 1.7 mg/dL — ABNORMAL HIGH (ref <1.0)

## 2024-03-06 MED ORDER — ACETAMINOPHEN 325 MG PO TABS: 650.0000 mg | ORAL_TABLET | Freq: Four times a day (QID) | ORAL | Status: DC | PRN

## 2024-03-06 MED ORDER — LOPERAMIDE HCL 2 MG PO CAPS
2.0000 mg | ORAL_CAPSULE | ORAL | Status: DC | PRN
Start: 2024-03-06 — End: 2024-03-06
  Administered 2024-03-06: 2 mg via ORAL
  Filled 2024-03-06: qty 1

## 2024-03-06 NOTE — Progress Notes (Signed)
 [facility] NEPHROLOGY PROGRESS NOTE  Assessment/ Plan: Pt is a 48 y.o. 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   # Suspected renal transplant infection: Patient presented with fever and CT scan finding of perinephric stranding.  Chronic mild hydronephrosis is unchanged from before.  Urine showed proteinuria but no UTI.  Blood cultures are negative so far.  This is possibly viral infection.  Seen by ID team and patient is off of antibiotics.  Clinically improved.  Renal function remains stable.     # Acute kidney injury on CKD 3b in transplanted kidney likely hemodynamically mediated due to poor oral intake, nausea.  Less likely pyelonephritis however recommend to check imaging studies in few months to ensure improvement of soft tissue stranding around kidney.  Renal function stable and clinically improved.  Follow-up with Dr. Tobie outpatient.   # Kidney transplant DDRT in 2003 with baseline CKD/immunosuppression: Continue Prograf  at home dose.  No need for stress dose steroid.  Holding Farxiga  until outpatient follow-up.   # Hypertension: Resume home medication, monitor BP.  Avoid hypotensive episode.  Discussed with the patient and his wife at the bedside.  Also discussed with primary team, ID via secure chat.  Subjective: Seen and examined at the bedside.  No new event overnight.  Urine output around 1.5 L documented.  He denies nausea, vomiting, chest pain, shortness of breath.  No urinary complaint.  No fever.  Objective Vital signs in last 24 hours: Vitals:   03/06/24 0009 03/06/24 0423 03/06/24 0700 03/06/24 0905  BP: (!) 155/79 (!) 160/89 (!) 156/88 (!) 158/87  Pulse: 84  84  (!) 107  Resp: (!) 28 14    Temp: 98.6 F (37 C) 98.6 F (37 C) 98.5 F (36.9 C)   TempSrc: Oral Oral Oral   SpO2:      Weight:      Height:       Weight change:   Intake/Output Summary (Last 24 hours) at 03/06/2024 1117 Last data filed at 03/06/2024 0338 Gross per 24 hour  Intake --  Output 1300 ml  Net -1300 ml       Labs: RENAL PANEL Recent Labs  Lab 03/03/24 0934 03/03/24 1031 03/04/24 0352 03/05/24 0354 03/06/24 0310  NA 139  --  138 138 141  K 4.4  --  4.5 4.3 4.5  CL 112*  --  110 111 112*  CO2 18*  --  20* 20* 21*  GLUCOSE 217*  --  183* 161* 197*  BUN 38*  --  43* 44* 41*  CREATININE 2.38*  --  2.83* 3.08* 2.91*  CALCIUM  8.9  --  9.1 9.1 8.7*  MG  --   --   --  2.3 2.4  ALBUMIN  --  3.0* 2.4* 2.7* 2.5*    Liver Function Tests: Recent Labs  Lab 03/04/24 0352 03/05/24 0354 03/06/24 0310  AST 26 23 16   ALT 24 22 17   ALKPHOS 82 72 60  BILITOT 0.4 0.3 0.3  PROT 5.5* 5.9* 5.4*  ALBUMIN 2.4* 2.7* 2.5*   Recent Labs  Lab 03/03/24 0934  LIPASE 18   No results for input(s): AMMONIA in the last 168 hours. CBC: Recent Labs    11/14/23 0240 03/03/24  9065 03/04/24 0352 03/05/24 0354 03/06/24 0310  HGB 9.0* 11.5* 10.7* 11.7* 10.0*  MCV 95.8 91.4 91.0 91.2 91.7    Cardiac Enzymes: No results for input(s): CKTOTAL, CKMB, CKMBINDEX, TROPONINI in the last 168 hours. CBG: Recent Labs  Lab 03/05/24 1626 03/05/24 2022 03/05/24 2203 03/06/24 0328 03/06/24 0748  GLUCAP 198* 287* 279* 171* 246*    Iron Studies: No results for input(s): IRON, TIBC, TRANSFERRIN, FERRITIN in the last 72 hours. Studies/Results: No results found.  Medications: Infusions:  piperacillin-tazobactam 3.375 g (03/05/24 2215)   promethazine  (PHENERGAN ) injection (IM or IVPB) Stopped (03/04/24 2055)    Scheduled Medications:  amLODipine   10 mg Oral Daily   insulin  pump   Subcutaneous TID WC, HS, 0200   isosorbide  mononitrate  60 mg Oral  Daily   levothyroxine   75 mcg Oral Q0600   metoprolol  succinate  50 mg Oral Daily   oxymetazoline  1 spray Each Nare BID   pantoprazole   40 mg Oral Daily   prasugrel   10 mg Oral Daily   scopolamine  1 patch Transdermal Once   sodium chloride   2 spray Each Nare Q4H   sodium chloride  flush  3 mL Intravenous Q12H   tacrolimus   1 mg Oral QHS   tacrolimus   2 mg Oral q AM    have reviewed scheduled and prn medications.  Physical Exam: General:NAD, comfortable Heart:RRR, s1s2 nl Lungs:clear b/l, no crackle Abdomen:soft, no allograft tenderness, non-distended Extremities:No edema Neurology: Alert awake and nonfocal.  Ginette Bradway Amelie Romney 03/06/2024,11:17 AM  LOS: 2 days

## 2024-03-06 NOTE — Discharge Summary (Signed)
 Physician Discharge Summary  Daniel Hill FMW:[id number] DOB: [birth year] DOA: 03/03/2024  PCP: Thurmond Cathlyn LABOR., MD  Admit date: 03/03/2024 Discharge date: 03/06/2024  Admitted From: (Home,) Disposition:  (Home)  Recommendations for Outpatient Follow-up:  Follow up with PCP in 1-2 weeks Please obtain BMP/CBC in one week   Diet recommendation: Heart Healthy / Carb Modified  Brief/Interim Summary:   Daniel Hill is a 48 y.o. male with medical history significant of ESRD status post renal transplant now CKD, status post failed 3 pancreatic transplants, diabetes type 1, hypothyroidism, GERD, CAD, anxiety, depression, OSA, obesity, glaucoma presenting with chest pain, nausea, vomiting.   Patient reportedly noted nausea and vomiting starting overnight which she does have at times in the setting of gastroparesis.  This was followed by burning sensation in his chest that he initially attributed to GERD however the pain was persistent and began to worsen and radiate to his abdomen and shoulder.  Took aspirin  and nitro at home with some improvement.  EMS was called and patient initially was febrile to 100.3.   Hydronephrosis Pyelonephritis/infected renal transplant - Patient presented with chest pain radiating to abdomen and shoulder and persistent nausea. Found to have evidence of infected renal transplant with stranding about the renal transplant and dome of the bladder on CT.  Leukocytosis to 17.6.  Fever for EMS to 100.3 temperature in the ED 99.4. > He has suspected infection to the renal transplant site, has been placed on empiric antibiotics, has been hydrated, blood cultures is negative at time of discharge, UA has been negative, ID has been consulted, he was kept on IV Zosyn during hospital stay, cultures remain negative and he is stable as discussed with ID, patient can be discharged home with no further need of antibiotics on discharge.       Chest pain at home prior to admission and  in the ER at the time of admission, clear radiation from his abdominal discomfort and nausea History of CAD  Known CAD with cath in July of this year with moderate two-vessel disease with recommendation for medical therapy. - EKG had questionable T wave depression in anterior/septal leads, however troponin negative x 2. - All symptoms resolved Continue home aspirin , prasugrel ,-metoprolol , Imdur , follow-up with cardiology postdischarge   Hypertension.  On combination of beta-blocker, Imdur  and Norvasc ,   AKI on CKD 3 Status post renal transplant - History of ESRD, now status post bilateral nephrectomy and renal transplant. -> Remains on tacrolimus .  Last followed with [facility] last year.  Transplant was 2003.  Follows with Dr. DOROTHA Blanch locally.  His baseline creatinine is close to 2, nephrology following AKI, he was kept on IV hydration, creatinine trending down this morning at 2.9, as discussed with pulmonary patient can be discharged home with outpatient follow-up with primary nephrologist Dr. Blanch.   - Hold Farxiga  on discharge per renal recommendation.     Hypothyroidism - Continue Synthroid    GERD - Continue PPI   Status post failed pancreatic transplant Type 1 diabetes > Continue tacrolimus  - Continue insulin  pump, will hold Farxiga  on discharge per renal recommendation.      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    Activity: As tolerated with Full fall precautions use walker/cane & assistance as needed   Disposition Home    Diet: Heart Healthy/carb modified   On your next visit with your primary care physician please Get Medicines reviewed and adjusted.   Please request your Prim.MD to go over all Hospital Tests and Procedure/Radiological results at the follow up, please get all Hospital records sent to your Prim MD by signing hospital release before you go home.   If you experience worsening of your admission symptoms, develop shortness of breath, life threatening emergency, suicidal or homicidal thoughts you must seek medical attention immediately by calling 911 or calling your MD immediately  if symptoms less severe.  You Must read complete instructions/literature along with all the possible adverse reactions/side effects for all the Medicines you take and that have been prescribed to you. Take any new Medicines after you have completely understood and accpet all the possible adverse reactions/side effects.   Do not drive, operating heavy machinery, perform activities at heights, swimming or participation in water activities or provide baby sitting services if your were admitted for syncope or siezures until you have seen by Primary MD or a Neurologist and advised to do so again.  Do not drive when taking Pain medications.    Do not take more than prescribed Pain, Sleep and Anxiety Medications  Special Instructions: If you have smoked or chewed Tobacco  in the last 2 yrs please stop smoking, stop any regular Alcohol  and or any Recreational drug use.  Wear Seat belts while  driving.   Please note  You were cared for by a hospitalist during your hospital stay. If you have any questions about your discharge medications or the care you received while you were in the hospital after you are discharged, you can call the unit and asked to speak with the hospitalist on call if the hospitalist that took care of you is not available. Once you are discharged, your primary care physician will handle any further medical issues. Please note that NO REFILLS for any discharge medications will be authorized once you are discharged, as it is imperative that you return to your primary care physician (or establish a relationship with a primary care physician if you do not have one) for your aftercare needs so that they can reassess your need for medications and monitor your lab values.   Increase activity slowly   Complete by: As directed       Allergies as of 03/06/2024       Reactions   Other Nausea And Vomiting   RAW Canteloupe, banana, tomatoes, all raw vegetables  Unless the vegetables have dressing on them. 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   Codeine Nausea And Vomiting   Metoclopramide Hcl Nausea And Vomiting        Medication List  STOP taking these medications    Farxiga  10 MG Tabs tablet Generic drug: dapagliflozin  propanediol       TAKE these medications    acetaminophen  325 MG tablet Commonly known as: TYLENOL  Take 2 tablets (650 mg total) by mouth every 6 (six) hours as needed for mild pain (pain score 1-3) or fever (or Fever >/= 101). What changed:  medication strength how much to take reasons to take this   amLODipine  10 MG tablet Commonly known as: NORVASC  Take 1 tablet (10 mg total) by mouth daily.   aspirin  EC 81 MG tablet Take 1 tablet (81 mg total) by mouth daily. Swallow whole.   Baqsimi  Two Pack 3 MG/DOSE Powd Generic drug: Glucagon  Place 3 mg into the nose once as needed for up to 1 dose.   cholecalciferol 25  MCG (1000 UNIT) tablet Commonly known as: VITAMIN D3 Take 1,000 Units by mouth daily.   Dexcom G6 Sensor Misc Use as instructed to check blood sugar. Change every 10 days   Dexcom G6 Transmitter Misc Use as instructed. Change every 90 days   esomeprazole  40 MG capsule Commonly known as: NEXIUM  Take 40 mg by mouth 2 (two) times daily.   Fiasp  100 UNIT/ML Soln Generic drug: Insulin  Aspart (w/Niacinamide) INJECT UP TO 50 UNITS UNDER THE SKIN VIA INSULIN  PUMP DAILY   fluticasone 50 MCG/ACT nasal spray Commonly known as: FLONASE Place 1 spray into both nostrils daily as needed for allergies.   furosemide  20 MG tablet Commonly known as: LASIX  Take 20 mg by mouth daily as needed for edema (for legs).   isosorbide  mononitrate 60 MG 24 hr tablet Commonly known as: IMDUR  Take 1 tablet (60 mg total) by mouth daily.   levothyroxine  75 MCG tablet Commonly known as: SYNTHROID  TAKE 1 TABLET(75 MCG) BY MOUTH DAILY   MAGNESIUM  GLYCINATE PLUS PO Take 1 capsule by mouth daily.   METHYLENE BLUE (BULK-SOLID) Powd Take 1 Scoop by mouth daily. Mix with a beverage and drink   metoprolol  succinate 50 MG 24 hr tablet Commonly known as: TOPROL -XL Take 1 tablet (50 mg total) by mouth daily. Take with or immediately following a meal.   multivitamins ther. w/minerals Tabs tablet Take 1 tablet by mouth daily.   nitroGLYCERIN  0.4 MG SL tablet Commonly known as: Nitrostat  Place 1 tablet (0.4 mg total) under the tongue every 5 (five) minutes as needed for chest pain.   Omnipod 5 DexG7G6 Pods Gen 5 Misc Use 1 pod every 3 days   prasugrel  10 MG Tabs tablet Commonly known as: EFFIENT  Take 1 tablet (10 mg total) by mouth daily.   promethazine  25 MG tablet Commonly known as: PHENERGAN  Take 25 mg by mouth every 6 (six) hours as needed for nausea.   SYRINGE/NEEDLE (DISP) 1 ML 23G X 1 1 ML Misc Use every week   tacrolimus  1 MG capsule Commonly known as: PROGRAF  Take 1-2 mg by mouth See  admin instructions. Take 2 capsules by mouth in the morning and 1 capsule at night   traMADol 50 MG tablet Commonly known as: ULTRAM Take 50 mg by mouth every 6 (six) hours as needed for pain.   TUBERCULIN SYR 1CC/25GX5/8 25G X 5/8 1 ML Misc Commonly known as: B-D TB SYRINGE 1CC/25GX5/8 USE 1 EVERY 2 WEEKS   Veltassa 8.4 g packet Generic drug: patiromer Take 8.4 g by mouth every other day.        Allergies  Allergen Reactions   Other Nausea And Vomiting  RAW Canteloupe, banana, tomatoes, all raw vegetables  Unless the vegetables have dressing on them.  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   Codeine Nausea And Vomiting   Metoclopramide Hcl Nausea And Vomiting    Consultations: ID Renal   Procedures/Studies: CT CHEST ABDOMEN PELVIS WO CONTRAST Result Date: 03/03/2024 EXAM: CT CHEST, ABDOMEN AND PELVIS WITHOUT CONTRAST 03/03/2024 11:45:17 AM TECHNIQUE: CT of the chest, abdomen and pelvis was performed without the administration of intravenous contrast. Multiplanar reformatted images are provided for review. Automated exposure control, iterative reconstruction, and/or weight based adjustment of the mA/kV was utilized to reduce the radiation dose to as low as reasonably achievable. COMPARISON: 06/27/2016 CLINICAL HISTORY: low grade fever, elevated WBC count, nausea/vomiting/chest pain, h/o pancreas / kidney transplant FINDINGS: CHEST: MEDIASTINUM AND LYMPH NODES: Heart and pericardium are unremarkable. Coronary artery calcifications. The central airways are clear. No mediastinal, hilar or axillary lymphadenopathy. LUNGS AND PLEURA: Mild scarring within the posterior right lung base. No focal consolidation or pulmonary edema. No pleural effusion or pneumothorax. No signs of interstitial edema. ABDOMEN AND PELVIS: LIVER: The liver is unremarkable. GALLBLADDER AND BILE DUCTS: Gallbladder is unremarkable. No biliary ductal dilatation. SPLEEN: The spleen is within normal  limits in size and appearance. PANCREAS: The pancreas appears diffusely atrophic. No main duct dilatation, inflammation, or mass. ADRENAL GLANDS: Normal size and morphology bilaterally. No nodule, thickening, or hemorrhage. No periadrenal stranding. KIDNEYS, URETERS AND BLADDER: Status post bilateral nephrectomy. Left lower quadrant renal graft with unchanged mild hydronephrosis but new perinephric soft tissue stranding. Mild stranding around the dome of the bladder is also noted. No stones in the kidneys or ureters. GI AND BOWEL: Stomach demonstrates no acute abnormality. No signs of bowel inflammation or distention. Moderate stool burden identified within the cecum. There is no bowel obstruction. REPRODUCTIVE ORGANS: The prostate gland appears normal. PERITONEUM AND RETROPERITONEUM: No ascites. No free air. VASCULATURE: Aorta is normal in caliber. ABDOMINAL AND PELVIS LYMPH NODES: Prominent mesenteric and retroperitoneal lymph nodes which appears stable from the prior exam. Left index periaortic lymph node is stable measuring 1 cm, image 76/3. BONES AND SOFT TISSUES: Multilevel thoracic degenerative disc disease. No acute or suspicious osseous abnormality. No focal soft tissue abnormality. IMPRESSION: 1. Left lower quadrant renal graft with unchanged mild hydronephrosis and new perinephric soft tissue stranding. Correlate for any clinical signs or symptoms of renal graft infection 2. Mild stranding around the dome of the bladder, which may reflect underlying cystitis. Electronically signed by: Waddell Calk MD 03/03/2024 12:18 PM EST RP Workstation: [id number]   DG Chest 2 View Result Date: 03/03/2024 EXAM: 2 VIEW(S) XRAY OF THE CHEST 03/03/2024 10:19:45 AM COMPARISON: 11/11/2023 CLINICAL HISTORY: chest pain FINDINGS: LUNGS AND PLEURA: No focal pulmonary opacity. No pulmonary edema. No pleural effusion. No pneumothorax. HEART AND MEDIASTINUM: No acute abnormality of the cardiac and mediastinal silhouettes.  BONES AND SOFT TISSUES: No acute osseous abnormality. IMPRESSION: 1. No acute cardiopulmonary process. Electronically signed by: Waddell Calk MD 03/03/2024 10:37 AM EST RP Workstation: [id number]     Subjective: He denies any complaints this morning  Discharge Exam: Vitals:   03/06/24 0700 03/06/24 0905  BP: (!) 156/88 (!) 158/87  Pulse:  (!) 107  Resp:    Temp: 98.5 F (36.9 C)   SpO2:     Vitals:   03/06/24 0009 03/06/24 0423 03/06/24 0700 03/06/24 0905  BP: (!) 155/79 (!) 160/89 (!) 156/88 (!) 158/87  Pulse: 84 84  (!) 107  Resp: (!) 28 14  Temp: 98.6 F (37 C) 98.6 F (37 C) 98.5 F (36.9 C)   TempSrc: Oral Oral Oral   SpO2:      Weight:      Height:        General: Pt is alert, awake, not in acute distress Cardiovascular: RRR, S1/S2 +, no rubs, no gallops Respiratory: CTA bilaterally, no wheezing, no rhonchi Abdominal: Soft, NT, ND, bowel sounds + Extremities: no edema, no cyanosis    The results of significant diagnostics from this hospitalization (including imaging, microbiology, ancillary and laboratory) are listed below for reference.     Microbiology: Recent Results (from the past 240 hours)  Blood Culture (routine x 2)     Status: None (Preliminary result)   Collection Time: 03/03/24 10:32 AM   Specimen: BLOOD RIGHT HAND  Result Value Ref Range Status   Specimen Description BLOOD RIGHT HAND  Final   Special Requests   Final    BOTTLES DRAWN AEROBIC AND ANAEROBIC Blood Culture adequate volume   Culture   Final    NO GROWTH 3 DAYS Performed at [facility], [address], [place], [place] [address]    Report Status PENDING  Incomplete  Blood Culture (routine x 2)     Status: None (Preliminary result)   Collection Time: 03/03/24 10:42 AM   Specimen: BLOOD RIGHT HAND  Result Value Ref Range Status   Specimen Description BLOOD RIGHT HAND  Final   Special Requests   Final    BOTTLES DRAWN AEROBIC AND ANAEROBIC Blood Culture adequate volume    Culture   Final    NO GROWTH 3 DAYS Performed at [facility], [address] [place], [place] [address]    Report Status PENDING  Incomplete  Resp panel by RT-PCR (RSV, Flu A&B, Covid) Anterior Nasal Swab     Status: None   Collection Time: 03/03/24 11:29 AM   Specimen: Anterior Nasal Swab  Result Value Ref Range Status   SARS Coronavirus 2 by RT PCR NEGATIVE NEGATIVE Final   Influenza A by PCR NEGATIVE NEGATIVE Final   Influenza B by PCR NEGATIVE NEGATIVE Final    Comment: (NOTE) The Xpert Xpress SARS-CoV-2/FLU/RSV plus assay is intended as an aid in the diagnosis of influenza from Nasopharyngeal swab specimens and should not be used as a sole basis for treatment. Nasal washings and aspirates are unacceptable for Xpert Xpress SARS-CoV-2/FLU/RSV testing.  Fact Sheet for Patients: bloggercourse.com  Fact Sheet for Healthcare Providers: seriousbroker.it  This test is not yet approved or cleared by the [place]  FDA and has been authorized for detection and/or diagnosis of SARS-CoV-2 by FDA under an Emergency Use Authorization (EUA). This EUA will remain in effect (meaning this test can be used) for the duration of the COVID-19 declaration under Section 564(b)(1) of the Act, 21 U.S.C. section 360bbb-3(b)(1), unless the authorization is terminated or revoked.     Resp Syncytial Virus by PCR NEGATIVE NEGATIVE Final    Comment: (NOTE) Fact Sheet for Patients: bloggercourse.com  Fact Sheet for Healthcare Providers: seriousbroker.it  This test is not yet approved or cleared by the [place]  FDA and has been authorized for detection and/or diagnosis of SARS-CoV-2 by FDA under an Emergency Use Authorization (EUA). This EUA will remain in effect (meaning this test can be used) for the duration of the COVID-19 declaration under Section 564(b)(1) of the Act, 21  U.S.C. section 360bbb-3(b)(1), unless the authorization is terminated or revoked.  Performed at [facility], [address], [place], [place] [address]      Labs: BNP (last 3 results) Recent Labs    11/11/23 0007 03/03/24 1031  BNP 113.2* 434.5*   Basic Metabolic Panel: Recent Labs  Lab 03/03/24 0934 03/04/24 0352 03/05/24 0354 03/06/24 0310  NA 139 138 138 141  K 4.4 4.5 4.3 4.5  CL 112* 110 111 112*  CO2 18* 20* 20* 21*  GLUCOSE 217* 183* 161* 197*  BUN 38* 43* 44* 41*  CREATININE 2.38* 2.83* 3.08* 2.91*  CALCIUM  8.9 9.1 9.1 8.7*  MG  --   --  2.3 2.4   Liver Function Tests: Recent Labs  Lab 03/03/24 1031 03/04/24 0352 03/05/24 0354 03/06/24 0310  AST 20 26 23 16   ALT 19 24 22 17   ALKPHOS 88 82 72 60  BILITOT 0.4 0.4 0.3 0.3  PROT 5.8* 5.5* 5.9* 5.4*  ALBUMIN 3.0* 2.4* 2.7* 2.5*   Recent Labs  Lab 03/03/24 0934  LIPASE 18   No results for input(s): AMMONIA in the last 168 hours. CBC: Recent Labs  Lab 03/03/24 0934 03/04/24 0352 03/05/24 0354 03/06/24 0310  WBC 17.6* 20.6* 13.6* 8.5  NEUTROABS  --   --  8.7* 4.1  HGB 11.5* 10.7* 11.7* 10.0*  HCT 34.9* 32.5* 35.4* 31.0*  MCV 91.4 91.0 91.2 91.7  PLT 329 293 323 295   Cardiac Enzymes: No results for input(s): CKTOTAL, CKMB, CKMBINDEX, TROPONINI in the last 168 hours. BNP: Invalid input(s): POCBNP CBG: Recent Labs  Lab 03/05/24 1626 03/05/24 2022 03/05/24 2203 03/06/24 0328 03/06/24 0748  GLUCAP 198* 287* 279* 171* 246*   D-Dimer No results for input(s): DDIMER in the last 72 hours. Hgb A1c No results for input(s): HGBA1C in the last 72 hours. Lipid Profile No results for input(s): CHOL, HDL, LDLCALC, TRIG, CHOLHDL, LDLDIRECT in the last 72 hours. Thyroid  function studies No results for input(s): TSH, T4TOTAL, T3FREE, THYROIDAB in the last 72 hours.  Invalid input(s): FREET3 Anemia work up No results for input(s): VITAMINB12,  FOLATE, FERRITIN, TIBC, IRON, RETICCTPCT in the last 72 hours. Urinalysis    Component Value Date/Time   COLORURINE YELLOW 03/03/2024 1031   APPEARANCEUR CLEAR 03/03/2024 1031   LABSPEC 1.011 03/03/2024 1031   PHURINE 5.0 03/03/2024 1031   GLUCOSEU >=500 (A) 03/03/2024 1031   HGBUR SMALL (A) 03/03/2024 1031   BILIRUBINUR NEGATIVE 03/03/2024 1031   KETONESUR NEGATIVE 03/03/2024 1031   PROTEINUR >=300 (A) 03/03/2024 1031   UROBILINOGEN 0.2 05/19/2011 0014   NITRITE NEGATIVE 03/03/2024 1031   LEUKOCYTESUR NEGATIVE 03/03/2024 1031   Sepsis Labs Recent Labs  Lab 03/03/24 0934 03/04/24 0352 03/05/24 0354 03/06/24 0310  WBC 17.6* 20.6* 13.6* 8.5   Microbiology Recent Results (from the past 240 hours)  Blood Culture (routine x 2)     Status: None (Preliminary result)   Collection Time: 03/03/24 10:32 AM   Specimen: BLOOD RIGHT HAND  Result Value Ref Range Status   Specimen Description BLOOD RIGHT HAND  Final   Special Requests   Final    BOTTLES DRAWN AEROBIC AND ANAEROBIC Blood Culture adequate volume   Culture   Final    NO GROWTH 3 DAYS Performed at [facility], [address] [place], [place] [address]    Report Status PENDING  Incomplete  Blood Culture (routine x 2)     Status: None (Preliminary result)   Collection Time: 03/03/24 10:42 AM   Specimen: BLOOD RIGHT HAND  Result Value Ref Range  Status   Specimen Description BLOOD RIGHT HAND  Final   Special Requests   Final    BOTTLES DRAWN AEROBIC AND ANAEROBIC Blood Culture adequate volume   Culture   Final    NO GROWTH 3 DAYS Performed at [facility], [address], [place], [place] [address]    Report Status PENDING  Incomplete  Resp panel by RT-PCR (RSV, Flu A&B, Covid) Anterior Nasal Swab     Status: None   Collection Time: 03/03/24 11:29 AM   Specimen: Anterior Nasal Swab  Result Value Ref Range Status   SARS Coronavirus 2 by RT PCR NEGATIVE NEGATIVE Final   Influenza A by PCR  NEGATIVE NEGATIVE Final   Influenza B by PCR NEGATIVE NEGATIVE Final    Comment: (NOTE) The Xpert Xpress SARS-CoV-2/FLU/RSV plus assay is intended as an aid in the diagnosis of influenza from Nasopharyngeal swab specimens and should not be used as a sole basis for treatment. Nasal washings and aspirates are unacceptable for Xpert Xpress SARS-CoV-2/FLU/RSV testing.  Fact Sheet for Patients: bloggercourse.com  Fact Sheet for Healthcare Providers: seriousbroker.it  This test is not yet approved or cleared by the [place]  FDA and has been authorized for detection and/or diagnosis of SARS-CoV-2 by FDA under an Emergency Use Authorization (EUA). This EUA will remain in effect (meaning this test can be used) for the duration of the COVID-19 declaration under Section 564(b)(1) of the Act, 21 U.S.C. section 360bbb-3(b)(1), unless the authorization is terminated or revoked.     Resp Syncytial Virus by PCR NEGATIVE NEGATIVE Final    Comment: (NOTE) Fact Sheet for Patients: bloggercourse.com  Fact Sheet for Healthcare Providers: seriousbroker.it  This test is not yet approved or cleared by the [place]  FDA and has been authorized for detection and/or diagnosis of SARS-CoV-2 by FDA under an Emergency Use Authorization (EUA). This EUA will remain in effect (meaning this test can be used) for the duration of the COVID-19 declaration under Section 564(b)(1) of the Act, 21 U.S.C. section 360bbb-3(b)(1), unless the authorization is terminated or revoked.  Performed at [facility], [address] [place], [place] [address]      Time coordinating discharge: Over 30 minutes  SIGNED:   Brayton Lye, MD  [facility] 03/06/2024, 3:19 PM Pager   If 7PM-7AM, please contact night-coverage [email or web address] Password [email or web address]

## 2024-03-06 NOTE — TOC Initial Note (Signed)
 Transition of Care [facility]) - Initial/Assessment Note    Patient Details  Name: Daniel Hill MRN: [id number] Date of Birth: April 07, 1976  Transition [facility] Care [facility]) CM/SW Contact:    Marval Gell, RN Phone Number: 03/06/2024, 8:51 AM  Clinical Narrative:                 Per chart review  Patient admitted with suspected renal transplant infection, ID following, final cultures pending.  ICM will follow for DC planning, no needs identified at this time.    Expected Discharge Plan: Home/Self Care Barriers to Discharge: Continued Medical Work up   Patient Goals and CMS Choice            Expected Discharge Plan and Services In-house Referral: NA Discharge Planning Services: CM Consult Post Acute Care Choice: NA Living arrangements for the past 2 months: Single Family Home                 DME Arranged: N/A                    Prior Living Arrangements/Services Living arrangements for the past 2 months: Single Family Home Lives with:: Spouse                   Activities of Daily Living   ADL Screening (condition at time of admission) Independently performs ADLs?: Yes (appropriate for developmental age) Is the patient deaf or have difficulty hearing?: No Does the patient have difficulty seeing, even when wearing glasses/contacts?: No Does the patient have difficulty concentrating, remembering, or making decisions?: No  Permission Sought/Granted                  Emotional Assessment              Admission diagnosis:  Pyelonephritis [N12] Complication of transplanted kidney, unspecified complication [T86.10] Nausea and vomiting, unspecified vomiting type [R11.2] Patient Active Problem List   Diagnosis Date Noted   Pyelonephritis 03/03/2024   Coronary artery disease of native heart with stable angina pectoris 11/15/2023   NSTEMI (non-ST elevated myocardial infarction) (HCC) 11/11/2023   Chest pain 11/11/2023   AKI (acute kidney injury) 11/11/2023    Hyperkalemia 11/11/2023   History of pancreas transplant (HCC) 11/11/2023   Insulin  dependent type 1 diabetes mellitus (HCC) 11/11/2023   GAD (generalized anxiety disorder) 11/11/2023   GERD (gastroesophageal reflux disease) 11/11/2023   Pulmonary edema 11/11/2023   Elevated blood pressure reading 11/11/2023   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 02/03/2022   OSA on CPAP 01/14/2021   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   Pneumonia due to COVID-19 virus 07/28/2018   Nausea and vomiting 07/28/2018   Hypothyroidism due to Hashimoto's thyroiditis 03/21/2018   CKD (chronic kidney disease) stage 3, GFR 30-59 ml/min (HCC) 09/20/2017   Mild episode of recurrent major depressive disorder 10/22/2015   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 02/05/2015   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   History of renal transplant 05/20/2011   Obesity 05/20/2011   PCP:  Thurmond Cathlyn LABOR., MD Pharmacy:   [facility] [phone] - [place], [place] - [address] AT [facility] [address] [facility] [address] [place] [address] Phone: [phone] Fax: [phone]  [facility] [place], [place] - [address] to [facility] [address] [address] Phone: [phone] Fax: [phone]  [facility] - [address] [place], [place] - [address] [place] [address] Phone: [phone] Fax: [phone]  [facility] [address] [place] [address] Phone: [phone] Fax: [phone]     Social Drivers of Health (SDOH) Social History: SDOH Screenings   Food Insecurity: No Food Insecurity (03/03/2024)  Housing: Low Risk  (03/03/2024)  Transportation Needs: No Transportation Needs (03/03/2024)  Utilities: Not At Risk (03/03/2024)  Depression (PHQ2-9): Low Risk  (02/12/2021)  Tobacco Use: Low Risk  (03/04/2024)   SDOH Interventions:      Readmission Risk Interventions     No data to display

## 2024-03-06 NOTE — Discharge Instructions (Signed)
 Follow with Primary MD Thurmond Cathlyn LABOR., MD in 7 days   Get CBC, CMP,  checked  by Primary MD next visit.    Activity: As tolerated with Full fall precautions use walker/cane & assistance as needed   Disposition Home    Diet: Heart Healthy/carb modified   On your next visit with your primary care physician please Get Medicines reviewed and adjusted.   Please request your Prim.MD to go over all Hospital Tests and Procedure/Radiological results at the follow up, please get all Hospital records sent to your Prim MD by signing hospital release before you go home.   If you experience worsening of your admission symptoms, develop shortness of breath, life threatening emergency, suicidal or homicidal thoughts you must seek medical attention immediately by calling 911 or calling your MD immediately  if symptoms less severe.  You Must read complete instructions/literature along with all the possible adverse reactions/side effects for all the Medicines you take and that have been prescribed to you. Take any new Medicines after you have completely understood and accpet all the possible adverse reactions/side effects.   Do not drive, operating heavy machinery, perform activities at heights, swimming or participation in water activities or provide baby sitting services if your were admitted for syncope or siezures until you have seen by Primary MD or a Neurologist and advised to do so again.  Do not drive when taking Pain medications.    Do not take more than prescribed Pain, Sleep and Anxiety Medications  Special Instructions: If you have smoked or chewed Tobacco  in the last 2 yrs please stop smoking, stop any regular Alcohol  and or any Recreational drug use.  Wear Seat belts while driving.   Please note  You were cared for by a hospitalist during your hospital stay. If you have any questions about your discharge medications or the care you received while you were in the hospital after you  are discharged, you can call the unit and asked to speak with the hospitalist on call if the hospitalist that took care of you is not available. Once you are discharged, your primary care physician will handle any further medical issues. Please note that NO REFILLS for any discharge medications will be authorized once you are discharged, as it is imperative that you return to your primary care physician (or establish a relationship with a primary care physician if you do not have one) for your aftercare needs so that they can reassess your need for medications and monitor your lab values.

## 2024-03-08 LAB — CULTURE, BLOOD (ROUTINE X 2)
Culture: NO GROWTH
Culture: NO GROWTH
Special Requests: ADEQUATE
Special Requests: ADEQUATE

## 2024-03-12 ENCOUNTER — Encounter: Payer: Self-pay | Admitting: Cardiology

## 2024-03-12 DIAGNOSIS — R040 Epistaxis: Secondary | ICD-10-CM

## 2024-03-12 DIAGNOSIS — I10 Essential (primary) hypertension: Secondary | ICD-10-CM

## 2024-03-12 DIAGNOSIS — I25118 Atherosclerotic heart disease of native coronary artery with other forms of angina pectoris: Secondary | ICD-10-CM

## 2024-03-12 NOTE — Telephone Encounter (Signed)
 ICD-10-CM   1. Coronary artery disease of native artery of native heart with stable angina pectoris  I25.118     2. Bleeding from the nose  R04.0      Medications Discontinued During This Encounter  Medication Reason   aspirin  EC 81 MG tablet Discontinued by provider

## 2024-03-14 ENCOUNTER — Other Ambulatory Visit (HOSPITAL_COMMUNITY): Payer: Self-pay

## 2024-03-14 ENCOUNTER — Other Ambulatory Visit: Payer: Self-pay

## 2024-03-14 DIAGNOSIS — I10 Essential (primary) hypertension: Secondary | ICD-10-CM

## 2024-03-14 MED ORDER — ISOSORB DINITRATE-HYDRALAZINE 20-37.5 MG PO TABS
1.0000 | ORAL_TABLET | Freq: Two times a day (BID) | ORAL | 2 refills | Status: DC
Start: 2024-03-14 — End: 2024-03-20

## 2024-03-14 MED ORDER — ISOSORB DINITRATE-HYDRALAZINE 20-37.5 MG PO TABS
1.0000 | ORAL_TABLET | Freq: Two times a day (BID) | ORAL | 2 refills | Status: DC
  Filled 2024-03-14: qty 52, 26d supply, fill #0
  Filled 2024-03-14: qty 8, 4d supply, fill #0

## 2024-03-14 NOTE — Telephone Encounter (Signed)
 ICD-10-CM   1. Coronary artery disease of native artery of native heart with stable angina pectoris  I25.118     2. Bleeding from the nose  R04.0     3. Primary hypertension  I10 isosorbide -hydrALAZINE  (BIDIL ) 20-37.5 MG tablet     Meds ordered this encounter  Medications   isosorbide -hydrALAZINE  (BIDIL ) 20-37.5 MG tablet    Sig: Take 1 tablet by mouth in the morning and at bedtime.    Dispense:  60 tablet    Refill:  2    Medications Discontinued During This Encounter  Medication Reason   aspirin  EC 81 MG tablet Discontinued by provider   isosorbide  mononitrate (IMDUR ) 60 MG 24 hr tablet Change in therapy

## 2024-03-14 NOTE — Addendum Note (Signed)
 Addended by: LADONA MILAN on: 03/14/2024 12:51 PM   Modules accepted: Orders

## 2024-03-20 ENCOUNTER — Encounter: Payer: Self-pay | Admitting: Cardiology

## 2024-03-20 ENCOUNTER — Ambulatory Visit: Attending: Cardiology | Admitting: Cardiology

## 2024-03-20 ENCOUNTER — Other Ambulatory Visit: Payer: Self-pay | Admitting: *Deleted

## 2024-03-20 VITALS — BP 140/70 | HR 88 | Resp 16 | Ht 69.0 in | Wt 203.4 lb

## 2024-03-20 DIAGNOSIS — I25118 Atherosclerotic heart disease of native coronary artery with other forms of angina pectoris: Secondary | ICD-10-CM

## 2024-03-20 DIAGNOSIS — I951 Orthostatic hypotension: Secondary | ICD-10-CM

## 2024-03-20 DIAGNOSIS — E785 Hyperlipidemia, unspecified: Secondary | ICD-10-CM

## 2024-03-20 DIAGNOSIS — I10 Essential (primary) hypertension: Secondary | ICD-10-CM

## 2024-03-20 LAB — LIPID PANEL
Chol/HDL Ratio: 3.3 ratio (ref 0.0–5.0)
Cholesterol, Total: 158 mg/dL (ref 100–199)
HDL: 48 mg/dL (ref >39)
LDL Chol Calc (NIH): 94 mg/dL (ref 0–99)
Triglycerides: 84 mg/dL (ref 0–149)
VLDL Cholesterol Cal: 16 mg/dL (ref 5–40)

## 2024-03-20 MED ORDER — ROSUVASTATIN CALCIUM 20 MG PO TABS: 20.0000 mg | ORAL_TABLET | Freq: Every day | ORAL | Status: DC

## 2024-03-20 MED ORDER — ISOSORB DINITRATE-HYDRALAZINE 20-37.5 MG PO TABS: 2.0000 | ORAL_TABLET | Freq: Two times a day (BID) | ORAL | 3 refills | Status: DC

## 2024-03-20 MED ORDER — RANOLAZINE ER 500 MG PO TB12: 500.0000 mg | ORAL_TABLET | Freq: Two times a day (BID) | ORAL | 3 refills | Status: DC

## 2024-03-20 NOTE — Progress Notes (Signed)
 Cardiology Office Note:  .   Date:  03/22/2024  ID:  Daniel Hill, DOB 26-Aug-1975, MRN [id number] PCP: Thurmond Cathlyn LABOR., MD  [facility] Providers Cardiologist:  Gordy Bergamo, MD Electrophysiologist:  OLE ONEIDA HOLTS, MD   History of Present Illness: .   Daniel Hill is a 48 y.o. Hill with medical history significant of renal and pancreatic transplant 2003 and failed pancreatic transplant in 2013 and he is considered too risk for new pancreatic transplant, DM type I with diabetic retinopathy, diabetic nephropathy with stage 4 chronic kidney disease, diabetic gastroparesis, diabetic autonomic insufficiency with orthostatic hypotension, hypercholesterolemia and very mildly elevated Lp(a) at 112, Hashimoto thyroiditis, hypogonadotrophic hypogonadism, generalized anxiety disorder, depression, coronary artery disease and NSTEMI when he presented to the hospital in July 2025 cardiac catheterization revealing significant disease and a large OM1, small OM 2 and moderate to severe disease in distal RCA and recommended medical therapy unless he had recurrence of angina.  He now presents for follow-up.  He continues to have shortness of breath and exertional chest pain occasionally, no rest pain.  No PND or orthopnea.  He continues to have occasional episodes of dizziness but no syncope.  Continues to have chronic fatigue.    Discussed the use of AI scribe software for clinical note transcription with the patient, who gave verbal consent to proceed.  History of Present Illness Daniel Hill with autonomic insufficiency and coronary artery disease who presents with worsening postural hypotension and chest tightness.  He reports marked worsening of postural hypotension recently, with profound fatigue, shortness of breath, and intermittent dizziness on standing. He can barely cross a parking lot without needing to sit. These episodes occurred yesterday morning and again this  morning.  He has autonomic insufficiency with orthostasis and diabetic gastroparesis and was hospitalized on March 03, 2024, for nausea and vomiting. He has not fully recovered since that hospitalization.  He is currently having frequent chest tightness and shortness of breath with minimal exertion, such as walking from his car to his desk, including yesterday and today.  He takes isosorbide  mononitrate twice daily and Zetia . He stopped Crestor  two months ago and previously stopped Lipitor because both caused nausea and lethargy.  His blood pressure was 161/80 this morning. He notes a drop of 40 to 50 points on standing and is concerned about blood pressure management and its contribution to his symptoms.   Cardiac Studies relevent.    ECHOCARDIOGRAM COMPLETE 11/11/2023  Left ventricular ejection fraction, by estimation, is 60 to 65%. The left ventricle has normal function. The left ventricle has no regional wall motion abnormalities. Left ventricular diastolic parameters were normal    Coronary angiogram 11/13/2023:   Plan: recommend medical therapy. Patient is severely hypertensive. Reports no recurrent chest pain over last 3 days. The second OM is too small for PCI. If he has refractory angina despite optimal medical therapy could consider PCI of OM1   Labs   Lab Results  Component Value Date   CHOL 158 03/20/2024   HDL 48 03/20/2024   LDLCALC 94 03/20/2024   TRIG 84 03/20/2024   CHOLHDL 3.3 03/20/2024    Recent Labs    03/04/24 0352 03/05/24 0354 03/06/24 0310  NA 138 138 141  K 4.5 4.3 4.5  CL 110 111 112*  CO2 20* 20* 21*  GLUCOSE 183* 161* 197*  BUN 43* 44* 41*  CREATININE 2.83* 3.08* 2.91*  CALCIUM  9.1 9.1 8.7*  GFRNONAA 26* 24* 26*    Lab Results  Component Value Date   ALT 17 03/06/2024   AST 16 03/06/2024   ALKPHOS 60 03/06/2024   BILITOT 0.3 03/06/2024      Latest Ref Rng & Units 03/06/2024    3:10 AM 03/05/2024    3:54 AM 03/04/2024    3:52 AM   CBC  WBC 4.0 - 10.5 K/uL 8.5  13.6  20.6   Hemoglobin 13.0 - 17.0 g/dL 89.9  88.2  89.2   Hematocrit 39.0 - 52.0 % 31.0  35.4  32.5   Platelets 150 - 400 K/uL 295  323  293    Lab Results  Component Value Date   HGBA1C 6.4 (H) 11/11/2023    Lab Results  Component Value Date   TSH 3.660 07/21/2020    ROS  Review of Systems  Constitutional: Positive for malaise/fatigue.  Cardiovascular:  Positive for chest pain and dyspnea on exertion. Negative for leg swelling.   Physical Exam:   VS:  BP (!) 140/70 (BP Location: Right Arm, Patient Position: Standing, Cuff Size: Large)   Pulse 88   Resp 16   Ht 5' 9 (1.753 m)   Wt 203 lb 6.4 oz (92.3 kg)   SpO2 96%   BMI 30.04 kg/m    Wt Readings from Last 3 Encounters:  03/20/24 203 lb 6.4 oz (92.3 kg)  03/04/24 203 lb 0.7 oz (92.1 kg)  12/05/23 212 lb 12.8 oz (96.5 kg)    BP Readings from Last 3 Encounters:  03/20/24 (!) 140/70  03/06/24 (!) 158/87  12/05/23 (!) 160/100   Physical Exam Neck:     Vascular: No carotid bruit or JVD.  Cardiovascular:     Rate and Rhythm: Normal rate and regular rhythm.     Pulses: Intact distal pulses.     Heart sounds: Normal heart sounds. No murmur heard.    No gallop.  Pulmonary:     Effort: Pulmonary effort is normal.     Breath sounds: Normal breath sounds.  Abdominal:     General: Bowel sounds are normal.     Palpations: Abdomen is soft.  Musculoskeletal:     Right lower leg: No edema.     Left lower leg: No edema.    EKG:         ASSESSMENT AND PLAN: .      ICD-10-CM   1. Coronary artery disease of native artery of native heart with stable angina pectoris  I25.118 ranolazine  (RANEXA ) 500 MG 12 hr tablet    isosorbide -hydrALAZINE  (BIDIL ) 20-37.5 MG tablet    rosuvastatin  (CRESTOR ) 20 MG tablet    Lipid Profile    Lipid Profile    2. Orthostatic hypotension  I95.1     3. Primary hypertension  I10 isosorbide -hydrALAZINE  (BIDIL ) 20-37.5 MG tablet    4. Hyperlipidemia LDL  goal <55  E78.5 rosuvastatin  (CRESTOR ) 20 MG tablet    Lipid Profile    Lipid Profile     Assessment & Plan Atherosclerotic heart disease of native coronary artery with angina pectoris Intermittent chest tightness and shortness of breath, likely due to angina. Coronary artery blockage is 70% in a small marginal artery, not warranting intervention. Symptoms exacerbated by physical activity and possibly deconditioning. Heart function is normal, but myocardial stiffness due to diabetes and hypertension may contribute to symptoms. - Prescribed Ranexa  500 mg twice daily to relieve chest pain without affecting blood pressure. - Increased isosorbide  mononitrate to two tablets twice daily to  improve blood flow to the heart. - Encouraged resumption of cardiac rehabilitation to improve conditioning. - His marked symptoms of fatigue, dyspnea, occasional episodes of angina cannot be explained by chest obtuse marginal disease along.  Given his stage IV chronic kidney disease, extremely high risk for renal failure with contrast exposure.  Explained this to the patient.  Orthostatic hypotension and essential hypertension Orthostatic hypotension with significant blood pressure drop upon standing. Current blood pressure is 148/70 mmHg. Hypertension management complicated by orthostatic hypotension. - Increased BiDil  to two tablets twice daily to manage blood pressure and improve cardiac symptoms.  Hyperlipidemia Previous intolerance to statins (Crestor  and Lipitor) causing nausea and lethargy. LDL was 104 mg/dL despite statin therapy, above target of 55 mg/dL. Plan to restart Crestor  with monitoring of lipid levels. - Restarted Crestor  20 mg once daily. - Checked lipid levels today and will recheck in six weeks. (Reviewed for baseline) Goal LDL <55.  Type 2 diabetes mellitus with diabetic autonomic neuropathy and gastroparesis Diabetes contributing to myocardial stiffness and autonomic neuropathy, affecting  blood pressure regulation and gastrointestinal function. Recent hospitalization for viral illness may have exacerbated symptoms.   Follow up: 3 months for angina and lipid management Signed,  Gordy Bergamo, MD, [facility] 03/22/2024, 9:13 AM [facility] [address] [place], [place] [address] Phone: [phone]. Fax:  [phone]

## 2024-03-20 NOTE — Patient Instructions (Signed)
 Medication Instructions:  Your physician has recommended you make the following change in your medication:  Resume Rosuvastatin  20 mg by mouth daily  Start Ranexa  500 mg by mouth twice daily  Increase Bidil  to 2 tabs twice daily   *If you need a refill on your cardiac medications before your next appointment, please call your pharmacy*  Lab Work: Have lab work (lipids) drawn today in the lab on the first floor. Have lab work (lipids) drawn in 6 weeks.  This lab work can be done in our building or at any [facility] location If you have labs (blood work) drawn today and your tests are completely normal, you will receive your results only by: MyChart Message (if you have MyChart) OR A paper copy in the mail If you have any lab test that is abnormal or we need to change your treatment, we will call you to review the results.  Testing/Procedures: none  Follow-Up: At [facility], you and your health needs are our priority.  As part of our continuing mission to provide you with exceptional heart care, our providers are all part of one team.  This team includes your primary Cardiologist (physician) and Advanced Practice Providers or APPs (Physician Assistants and Nurse Practitioners) who all work together to provide you with the care you need, when you need it.  Your next appointment:   3 month(s)  Provider:   Gordy Bergamo, MD    We recommend signing up for the patient portal called MyChart.  Sign up information is provided on this After Visit Summary.  MyChart is used to connect with patients for Virtual Visits (Telemedicine).  Patients are able to view lab/test results, encounter notes, upcoming appointments, etc.  Non-urgent messages can be sent to your provider as well.   To learn more about what you can do with MyChart, go to [email or web address].   Other Instructions

## 2024-03-22 ENCOUNTER — Ambulatory Visit: Payer: Self-pay | Admitting: Cardiology

## 2024-03-24 ENCOUNTER — Emergency Department (HOSPITAL_COMMUNITY)

## 2024-03-24 ENCOUNTER — Emergency Department (HOSPITAL_COMMUNITY)
Admission: EM | Admit: 2024-03-24 | Discharge: 2024-03-24 | Disposition: A | Discharge status: Home / Self Care | Attending: Emergency Medicine | Admitting: Emergency Medicine

## 2024-03-24 ENCOUNTER — Encounter (HOSPITAL_COMMUNITY): Payer: Self-pay

## 2024-03-24 ENCOUNTER — Other Ambulatory Visit: Payer: Self-pay

## 2024-03-24 ENCOUNTER — Other Ambulatory Visit: Payer: Self-pay | Admitting: Cardiology

## 2024-03-24 DIAGNOSIS — Z8616 Personal history of COVID-19: Secondary | ICD-10-CM | POA: Insufficient documentation

## 2024-03-24 DIAGNOSIS — I25118 Atherosclerotic heart disease of native coronary artery with other forms of angina pectoris: Secondary | ICD-10-CM

## 2024-03-24 DIAGNOSIS — I25119 Atherosclerotic heart disease of native coronary artery with unspecified angina pectoris: Secondary | ICD-10-CM | POA: Diagnosis not present

## 2024-03-24 DIAGNOSIS — I129 Hypertensive chronic kidney disease with stage 1 through stage 4 chronic kidney disease, or unspecified chronic kidney disease: Secondary | ICD-10-CM | POA: Insufficient documentation

## 2024-03-24 DIAGNOSIS — Z79899 Other long term (current) drug therapy: Secondary | ICD-10-CM | POA: Diagnosis not present

## 2024-03-24 DIAGNOSIS — Z794 Long term (current) use of insulin: Secondary | ICD-10-CM | POA: Diagnosis not present

## 2024-03-24 DIAGNOSIS — E1043 Type 1 diabetes mellitus with diabetic autonomic (poly)neuropathy: Secondary | ICD-10-CM | POA: Diagnosis not present

## 2024-03-24 DIAGNOSIS — I2089 Other forms of angina pectoris: Secondary | ICD-10-CM

## 2024-03-24 DIAGNOSIS — N184 Chronic kidney disease, stage 4 (severe): Secondary | ICD-10-CM | POA: Insufficient documentation

## 2024-03-24 DIAGNOSIS — N2589 Other disorders resulting from impaired renal tubular function: Secondary | ICD-10-CM

## 2024-03-24 DIAGNOSIS — E039 Hypothyroidism, unspecified: Secondary | ICD-10-CM | POA: Diagnosis not present

## 2024-03-24 DIAGNOSIS — I10 Essential (primary) hypertension: Secondary | ICD-10-CM

## 2024-03-24 DIAGNOSIS — R079 Chest pain, unspecified: Secondary | ICD-10-CM

## 2024-03-24 LAB — CBC
HCT: 33.5 % — ABNORMAL LOW (ref 39.0–52.0)
Hemoglobin: 10.6 g/dL — ABNORMAL LOW (ref 13.0–17.0)
MCH: 29.9 pg (ref 26.0–34.0)
MCHC: 31.6 g/dL (ref 30.0–36.0)
MCV: 94.6 fL (ref 80.0–100.0)
Platelets: 447 K/uL — ABNORMAL HIGH (ref 150–400)
RBC: 3.54 MIL/uL — ABNORMAL LOW (ref 4.22–5.81)
RDW: 13.8 % (ref 11.5–15.5)
WBC: 6.8 K/uL (ref 4.0–10.5)
nRBC: 0 % (ref 0.0–0.2)

## 2024-03-24 LAB — TROPONIN I (HIGH SENSITIVITY)
Troponin I (High Sensitivity): 6 ng/L (ref <18)
Troponin I (High Sensitivity): 8 ng/L (ref <18)

## 2024-03-24 LAB — BASIC METABOLIC PANEL WITH GFR
Anion gap: 10 (ref 5–15)
BUN: 53 mg/dL — ABNORMAL HIGH (ref 6–20)
CO2: 16 mmol/L — ABNORMAL LOW (ref 22–32)
Calcium: 9 mg/dL (ref 8.9–10.3)
Chloride: 111 mmol/L (ref 98–111)
Creatinine, Ser: 2.95 mg/dL — ABNORMAL HIGH (ref 0.61–1.24)
GFR, Estimated: 25 mL/min — ABNORMAL LOW (ref >60)
Glucose, Bld: 175 mg/dL — ABNORMAL HIGH (ref 70–99)
Potassium: 5.7 mmol/L — ABNORMAL HIGH (ref 3.5–5.1)
Sodium: 137 mmol/L (ref 135–145)

## 2024-03-24 MED ORDER — METOPROLOL SUCCINATE ER 100 MG PO TB24: 100.0000 mg | ORAL_TABLET | Freq: Every day | ORAL | 1 refills | Status: DC

## 2024-03-24 MED ORDER — ISOSORBIDE MONONITRATE ER 60 MG PO TB24: 60.0000 mg | ORAL_TABLET | Freq: Every day | ORAL | 1 refills | Status: DC

## 2024-03-24 MED ORDER — ISOSORBIDE MONONITRATE ER 30 MG PO TB24
60.0000 mg | ORAL_TABLET | Freq: Every day | ORAL | Status: DC
  Administered 2024-03-24: 60 mg via ORAL
  Filled 2024-03-24: qty 2

## 2024-03-24 MED ORDER — SODIUM ZIRCONIUM CYCLOSILICATE 10 G PO PACK
10.0000 g | PACK | Freq: Once | ORAL | Status: AC
  Administered 2024-03-24: 10 g via ORAL
  Filled 2024-03-24: qty 1

## 2024-03-24 NOTE — ED Triage Notes (Signed)
 Pt BIB [facility] c/o left sided CP that radiates into his neck and left shoulder. Pt states it started a week ago and has been intermittent. Normally comes with exertion today he tried to set down and rest and the pain persisted. Pt took 324 ASA and 2 Nitros before EMS arrived. Pt has an old fistula in the left arm.

## 2024-03-24 NOTE — Discharge Instructions (Addendum)
 Please evaluated you for your chest pain.  Your cardiac testing was reassuring.  You were evaluated by cardiology.  They have made some medication adjustments and plan to obtain an outpatient stress test.  Please follow-up as scheduled.  The cardiologist have made some medication adjustments and sent these to the pharmacy.  Please take these as prescribed  Please return if you have any new or worsening symptoms such as chest pain at rest, chest pain does not go away, fainting, palpitations, difficulty breathing, or any other new or worsening symptoms.

## 2024-03-24 NOTE — Consult Note (Addendum)
 Cardiology Consultation   Patient ID: BUCK MCAFFEE MRN: [id number]; DOB: [birth year]  Admit date: 03/24/2024 Date of Consult: 03/24/2024  PCP:  Thurmond Cathlyn LABOR., MD   [facility] Providers Cardiologist:  Gordy Bergamo, MD  Electrophysiologist:  OLE ONEIDA HOLTS, MD        Patient Profile: Daniel Hill is a 48 y.o. male with a hx of ESRD s/p transplant now with CKD, s/p 3 failed pancreatic transplants, type 1 DM, hypothyroidism, GERD, CAD, anxiety, depression, OSA who is being seen 03/24/2024 for the evaluation of chest pain at the request of Dr. Francesca.  History of Present Illness: [name] is a 48 year old male with above medical history who is followed by Dr. Bergamo. Patient previously admitted in 10/2023 with NSTEMI with trop peaking at 601. Underwent echocardiogram 11/11/23 that showed EF 60-65%, no regional wall motion abnormalities, normal RV function, mild MR. Cardiac catheterization on 11/13/23 showed moderate 2 vessel CAD. Recommended medical management, especially as patient was severely hypertensive. Started on DAPT with ASA and effient  for 1 year. Also on amlodipine , zetia , imdur , metoprolol , crestor .   Patient was admitted from 03/03/24-03/06/24. He had presented with nausea and vomiting. Found to have a fever and was ultimately diagnosed with pyelonephritis of his transplanted kidney. Treated with hydration and antibiotics. He did have some chest pain, troponin were negative x2. Set up for close cardiology follow up.   Seen by Dr. Bergamo on 03/20/24. Per his note, it is possible that his chest pain was due to angina. His recent cath had shown 70% blockage in small marginal artery that was not felt to warrant intervention. Additionally, with his CKD he was felt to have a high risk of renal failure if given more contrast. He was started on Ranexa . Imdur  increased. Encouraged to continue cardiac rehab.   Patient presented to the ED on 11/30 complaining of chest pain that  radiated to his neck and left shoulder. Patient was hypertensive with BP 162/94. EKG showed NSR, HR 97 BPM. No significant changes compared to previous. Labs with K 5.7, creatinine 2.95, WBC 6.8, hemoglobin 10.6. Initial hsTn 6. CXR showed no acute process.   On interview, patient reports that he has been having episodes of chest pain with exertion for the past few weeks. At first, pain would occur with exertion and would go away quickly if he sat down to rest. For the past few days, he has noticed that the pain dose not go away with rest. This morning, he walked from his bedroom to the bathroom and developed chest pain. Pain feels like burning. Sometimes radiates to his neck and arm. He denies shortness of breath. He has been compliant with his medications.  Past Medical History:  Diagnosis Date   Anxiety    Depression    Diabetes mellitus    GERD (gastroesophageal reflux disease)    Hypothyroidism    Kidney replaced by transplant    Pancreas transplanted [facility])    Thyroid  disease     Past Surgical History:  Procedure Laterality Date   CORONARY ANGIOGRAPHY N/A 11/13/2023   Procedure: CORONARY ANGIOGRAPHY;  Surgeon: Jordan, Peter M, MD;  Location: [facility] INVASIVE CV LAB;  Service: Cardiovascular;  Laterality: N/A;   EYE SURGERY       Scheduled Meds:  Continuous Infusions:  PRN Meds:   Allergies:    Allergies  Allergen Reactions   Lipitor [Atorvastatin ] Other (See Comments)   Other Nausea And Vomiting    RAW Canteloupe, banana,  tomatoes, all raw vegetables  Unless the vegetables have dressing on them.  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   Codeine Nausea And Vomiting   Metoclopramide Hcl Nausea And Vomiting    Social History:   Social History   Socioeconomic History   Marital status: Married    Spouse name: Not on file   Number of children: Not on file   Years of education: Not on file   Highest education level: Not on file  Occupational History   Not on file   Tobacco Use   Smoking status: Never   Smokeless tobacco: Never  Vaping Use   Vaping status: Never Used  Substance and Sexual Activity   Alcohol use: Yes    Comment: Rarely - maybe once a year   Drug use: No   Sexual activity: Not on file  Other Topics Concern   Not on file  Social History Narrative   Not on file   Social Drivers of Health   Financial Resource Strain: Not on file  Food Insecurity: No Food Insecurity (03/03/2024)   Hunger Vital Sign    Worried About Running Out of Food in the Last Year: Never true    Ran Out of Food in the Last Year: Never true  Transportation Needs: No Transportation Needs (03/03/2024)   PRAPARE - Administrator, Civil Service (Medical): No    Lack of Transportation (Non-Medical): No  Physical Activity: Not on file  Stress: Not on file  Social Connections: Not on file  Intimate Partner Violence: Not At Risk (03/03/2024)   Humiliation, Afraid, Rape, and Kick questionnaire    Fear of Current or Ex-Partner: No    Emotionally Abused: No    Physically Abused: No    Sexually Abused: No    Family History:   Family History  Problem Relation Age of Onset   Stroke Mother    Lymphoma Father    Diabetes type II Father    Heart attack Brother    Thyroid  cancer Brother      ROS:  Please see the history of present illness.   All other ROS reviewed and negative.     Physical Exam/Data: Vitals:   03/24/24 1028 03/24/24 1030 03/24/24 1034  BP:  (!) 162/94   Pulse:  100   Resp:  15   Temp:  98.3 F (36.8 C)   TempSrc:  Oral   SpO2: 98% 99%   Weight:   92.1 kg  Height:   5' 9 (1.753 m)   No intake or output data in the 24 hours ending 03/24/24 1203    03/24/2024   10:34 AM 03/20/2024    8:48 AM 03/04/2024   11:00 AM  Last 3 Weights  Weight (lbs) 203 lb 203 lb 6.4 oz 203 lb 0.7 oz  Weight (kg) 92.08 kg 92.262 kg 92.1 kg     Body mass index is 29.98 kg/m.  General:  Well nourished, well developed, in no acute distress.  Sitting comfortably in the bed  HEENT: normal Neck: no JVD Vascular: Radial pulses 2+ bilaterally Cardiac:  normal S1, S2; RRR; no murmur  Lungs:  clear to auscultation bilaterally, no wheezing, rhonchi or rales. Normal WOB on room air  Abd: soft, nontender,  Ext: no edema in BLE Musculoskeletal:  No deformities  Skin: warm and dry  Neuro:  CNs 2-12 intact, no focal abnormalities noted Psych:  Normal affect   EKG:  The EKG was personally reviewed and demonstrates:  NSR, HR 97 BPM. No significant changes compared to previous  Telemetry:  Telemetry was personally reviewed and demonstrates:  NSR, HR in the 90s  Relevant CV Studies: Cardiac Studies & Procedures   ______________________________________________________________________________________________ CARDIAC CATHETERIZATION  CARDIAC CATHETERIZATION 11/13/2023  Conclusion   Dist RCA lesion is 70% stenosed.   Prox LAD to Mid LAD lesion is 30% stenosed.   1st Mrg-1 lesion is 70% stenosed.   1st Mrg-2 lesion is 85% stenosed.   2nd Mrg lesion is 95% stenosed.   Prox Cx to Mid Cx lesion is 50% stenosed.  Moderate 2 vessel CAD.  Plan: recommend medical therapy. Patient is severely hypertensive. Reports no recurrent chest pain over last 3 days. The second OM is too small for PCI. If he has refractory angina despite optimal medical therapy could consider PCI of OM1  Findings Coronary Findings Diagnostic  Dominance: Right  Left Main Vessel was injected. Vessel is normal in caliber. Vessel is angiographically normal.  Left Anterior Descending Prox LAD to Mid LAD lesion is 30% stenosed.  Left Circumflex Prox Cx to Mid Cx lesion is 50% stenosed.  First Obtuse Marginal Branch 1st Mrg-1 lesion is 70% stenosed. 1st Mrg-2 lesion is 85% stenosed.  Second Obtuse Marginal Branch Vessel is small in size. 2nd Mrg lesion is 95% stenosed.  Right Coronary Artery Dist RCA lesion is 70% stenosed.  Intervention  No interventions  have been documented.     ECHOCARDIOGRAM  ECHOCARDIOGRAM COMPLETE 11/11/2023  Narrative ECHOCARDIOGRAM REPORT    Patient Name:   SAHAJ BONA Date of Exam: 11/11/2023 Medical Rec #:  [id number]       Height:       69.0 in Accession #:    [id number]      Weight:       206.8 lb Date of Birth:  09-14-1975        BSA:          2.096 m Patient Age:    47 years        BP:           144/81 mmHg Patient Gender: M               HR:           81 bpm. Exam Location:  Inpatient  Procedure: 2D Echo, Cardiac Doppler and Color Doppler (Both Spectral and Color Flow Doppler were utilized during procedure).  Indications:    Chest Pain R07.9  History:        Patient has no prior history of Echocardiogram examinations. Risk Factors:Diabetes.  Sonographer:    Jayson Gaskins Referring Phys: [id number] SUBRINA SUNDIL  IMPRESSIONS   1. Left ventricular ejection fraction, by estimation, is 60 to 65%. The left ventricle has normal function. The left ventricle has no regional wall motion abnormalities. Left ventricular diastolic parameters were normal. 2. Right ventricular systolic function is normal. The right ventricular size is normal. Tricuspid regurgitation signal is inadequate for assessing PA pressure. 3. The mitral valve is normal in structure. Mild mitral valve regurgitation. No evidence of mitral stenosis. 4. The aortic valve is normal in structure. Aortic valve regurgitation is not visualized. No aortic stenosis is present. 5. The inferior vena cava is normal in size with greater than 50% respiratory variability, suggesting right atrial pressure of 3 mmHg.  FINDINGS Left Ventricle: Left ventricular ejection fraction, by estimation, is 60 to 65%. The left ventricle has normal function. The left ventricle has no regional wall motion abnormalities. The left  ventricular internal cavity size was normal in size. There is no left ventricular hypertrophy. Left ventricular diastolic parameters were  normal.  Right Ventricle: The right ventricular size is normal. No increase in right ventricular wall thickness. Right ventricular systolic function is normal. Tricuspid regurgitation signal is inadequate for assessing PA pressure.  Left Atrium: Left atrial size was normal in size.  Right Atrium: Right atrial size was normal in size.  Pericardium: There is no evidence of pericardial effusion.  Mitral Valve: The mitral valve is normal in structure. Mild mitral valve regurgitation, with centrally-directed jet. No evidence of mitral valve stenosis.  Tricuspid Valve: The tricuspid valve is normal in structure. Tricuspid valve regurgitation is not demonstrated. No evidence of tricuspid stenosis.  Aortic Valve: The aortic valve is normal in structure. Aortic valve regurgitation is not visualized. No aortic stenosis is present. Aortic valve mean gradient measures 7.0 mmHg. Aortic valve peak gradient measures 10.6 mmHg. Aortic valve area, by VTI measures 2.87 cm.  Pulmonic Valve: The pulmonic valve was normal in structure. Pulmonic valve regurgitation is not visualized. No evidence of pulmonic stenosis.  Aorta: The aortic root is normal in size and structure.  Venous: The inferior vena cava is normal in size with greater than 50% respiratory variability, suggesting right atrial pressure of 3 mmHg.  IAS/Shunts: No atrial level shunt detected by color flow Doppler.   LEFT VENTRICLE PLAX 2D LVIDd:         4.90 cm   Diastology LVIDs:         2.60 cm   LV e' medial:    9.03 cm/s LV PW:         1.10 cm   LV E/e' medial:  13.5 LV IVS:        0.90 cm   LV e' lateral:   12.50 cm/s LVOT diam:     1.90 cm   LV E/e' lateral: 9.8 LV SV:         114 LV SV Index:   54 LVOT Area:     2.84 cm   RIGHT VENTRICLE RV S prime:     13.90 cm/s TAPSE (M-mode): 2.3 cm  LEFT ATRIUM             Index        RIGHT ATRIUM           Index LA Vol (A2C):   36.4 ml 17.37 ml/m  RA Area:     10.90 cm LA Vol  (A4C):   53.4 ml 25.48 ml/m  RA Volume:   23.80 ml  11.36 ml/m LA Biplane Vol: 45.1 ml 21.52 ml/m AORTIC VALVE AV Area (Vmax):    2.73 cm AV Area (Vmean):   2.86 cm AV Area (VTI):     2.87 cm AV Vmax:           163.00 cm/s AV Vmean:          126.000 cm/s AV VTI:            0.396 m AV Peak Grad:      10.6 mmHg AV Mean Grad:      7.0 mmHg LVOT Vmax:         157.00 cm/s LVOT Vmean:        127.000 cm/s LVOT VTI:          0.401 m LVOT/AV VTI ratio: 1.01  AORTA Ao Root diam: 2.70 cm  MITRAL VALVE MV Area (PHT): 3.58 cm     SHUNTS MV  Decel Time: 212 msec     Systemic VTI:  0.40 m MV E velocity: 122.00 cm/s  Systemic Diam: 1.90 cm MV A velocity: 87.00 cm/s MV E/A ratio:  1.40  Greidy Sherard MD Electronically signed by Jerel Balding MD Signature Date/Time: 11/11/2023/1:08:07 PM    Final          ______________________________________________________________________________________________       Laboratory Data: High Sensitivity Troponin:   Recent Labs  Lab 03/03/24 0934 03/03/24 1128 03/24/24 1042  TROPONINIHS 7 9 6      Chemistry Recent Labs  Lab 03/24/24 1042  NA 137  K 5.7*  CL 111  CO2 16*  GLUCOSE 175*  BUN 53*  CREATININE 2.95*  CALCIUM  9.0  GFRNONAA 25*  ANIONGAP 10    No results for input(s): PROT, ALBUMIN, AST, ALT, ALKPHOS, BILITOT in the last 168 hours. Lipids  Recent Labs  Lab 03/20/24 0954  CHOL 158  TRIG 84  HDL 48  LABVLDL 16  LDLCALC 94  CHOLHDL 3.3    Hematology Recent Labs  Lab 03/24/24 1042  WBC 6.8  RBC 3.54*  HGB 10.6*  HCT 33.5*  MCV 94.6  MCH 29.9  MCHC 31.6  RDW 13.8  PLT 447*   Thyroid  No results for input(s): TSH, FREET4 in the last 168 hours.  BNPNo results for input(s): BNP, PROBNP in the last 168 hours.  DDimer No results for input(s): DDIMER in the last 168 hours.  Radiology/Studies:  DG Chest 2 View Result Date: 03/24/2024 EXAM: 2 VIEW(S) XRAY OF THE CHEST 03/24/2024  11:21:00 AM COMPARISON: 03/03/2024 CLINICAL HISTORY: CP FINDINGS: LUNGS AND PLEURA: No focal pulmonary opacity. No pleural effusion. No pneumothorax. HEART AND MEDIASTINUM: No acute abnormality of the cardiac and mediastinal silhouettes. BONES AND SOFT TISSUES: No acute fracture or destructive lesion. Multilevel thoracic osteophytosis. IMPRESSION: 1. No acute process. Electronically signed by: Evalene Coho MD 03/24/2024 11:31 AM EST RP Workstation: [id number]     Assessment and Plan:  Chest pain  CAD  - Patient previously had cath in 10/2023 that showed 95% stenosis in 2nd OM, 80% and 70% stenoses in 1st OM, 30 % prox-mid LAD stenosis, 70% distal RCA stenosis. Medically managed  - Now, patient presents with a couple of weeks of chest pain on exertion. Chest pain usually resolves with rest, but over the past 2 days he has had episodes that do not resolve with rest. This AM, walking from his bed to the bathroom caused chest pain that did not resolve with rest, but did resolve with 2 SL nitroglycerin   - Initial trop 6. Repeat pending  - EKG without ischemic changes  - Patient has CKD stage IV. Has one kidney s/p transplant in 2003. Very hesitant to use contrast dye. Recommend medical management  - Continue amlodipine  10 mg daily  - Transition from BiDil  20-37.5 mg BID to imdur  60 mg daily  - Increase metoprolol  succinate to 100 mg daily  - Continue ranexa  500 mg BID  - Ordered outpatient PET Stress   HTN  - Managed with anti-anginal medications as above  Type 1 DM  - Followed by endocrinology   CKD stage IV  - Patient previously had ESRD, underwent kidney transplant in 2003 - Creatinine 2.95 today  - Avoid contrast dye, nephrotoxic medications   Risk Assessment/Risk Scores:   For questions or updates, please contact [facility] Please consult [email or web address] for contact info under    Signed, Rollo FABIENE Louder, PA-C  03/24/2024 12:03 PM  I  have seen and examined the  patient along with Rollo FABIENE Louder, PA-C , PA NP.  I have reviewed the chart, notes and new data.  I agree with PA/NP's note.  Key new complaints: He is describing gradually worsening stable angina pectoralis, rather than an acute coronary syndrome.   He has significant problems with gastroparesis, but has not thrown up his medications to date. Key examination changes: Opacified left cornea.  Appears chronically ill.  Normal cardiovascular examination.  Clear lungs.  No edema. Key new findings / data: Negative serial high-sensitivity troponin.  No acute ischemic changes on ECG.  Labs notable for creatinine at baseline around 3.0 (GFR 25).  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  ECG, biomarkers and the clinical history do not support acute coronary sufficiency due to a ruptured plaque. He wants to protect his transplanted kidney and to avoid contrast based procedures if at all possible. There is a very severe stenosis in the second oblique marginal, vessel that is too small for angioplasty or stent.  In addition he has moderate to roughly 70% stenosis in several other locations including the OM1, mid-distal RCA.  Will be difficult to establish a target for future PCI without more data.  - Recommend outpatient cardiac PET scan.  Would pursue PCI only if the study shows a distinct large area of ischemic myocardium supplied by a vessel that is amenable to PCI and if we fail to control her symptoms with medical therapy. - n the meantime, discontinue hydralazine , which lowers anginal threshold, increase the dose of beta-blocker, continue ranolazine  at this dose limited by renal dysfunction) and amlodipine . - There should be room to increase the isosorbide  further if the current dose is insufficient for symptom relief. - If the blood pressure is not well-controlled on metoprolol , consider switching to carvedilol. - Warned him  about the increased risk of hypoglycemia unawareness with higher doses of beta-blockers, but he has a CGM should be protective. - Reviewed the symptoms that should prompt immediate reevaluation in the emergency room (persistent chest discomfort that has not relieved in 30 minutes or by 3 consecutive sublingual nitroglycerin  tablets).   Jerel Balding, MD, FACC [facility] [phone] 03/24/2024, 2:30 PM

## 2024-03-24 NOTE — ED Provider Notes (Signed)
 [place] EMERGENCY DEPARTMENT AT [facility] Provider Note  CSN: [id number] Arrival date & time: 03/24/24 1027  Chief Complaint(s) Chest Pain  HPI Daniel Hill is a 48 y.o. male history of renal transplant, CKD, diabetes, gastroparesis, coronary artery disease, presenting to the emergency department with chest pain.  Patient reports chest pain typically with exertion.  Today patient had episode of chest pain that was worse than typical, did not improve with rest, lasted around 40 minutes.  Patient reports pain in the left chest, radiated to the left arm and neck.  Reports associated nausea.  No syncope but felt lightheaded.  No palpitations.  Reports chronic leg swelling which is unchanged.  During episode he also felt short of breath.  No diaphoresis.  Feels otherwise similar to prior episodes just worse and more persistent.  Took nitroglycerin  without any improvement initially, en route pain did resolve.   Past Medical History Past Medical History:  Diagnosis Date   Anxiety    Depression    Diabetes mellitus    GERD (gastroesophageal reflux disease)    Hypothyroidism    Kidney replaced by transplant    Pancreas transplanted [facility])    Thyroid  disease    Patient Active Problem List   Diagnosis Date Noted   Pyelonephritis 03/03/2024   Coronary artery disease of native heart with stable angina pectoris 11/15/2023   NSTEMI (non-ST elevated myocardial infarction) (HCC) 11/11/2023   Chest pain 11/11/2023   AKI (acute kidney injury) 11/11/2023   Hyperkalemia 11/11/2023   History of pancreas transplant (HCC) 11/11/2023   Insulin  dependent type 1 diabetes mellitus (HCC) 11/11/2023   GAD (generalized anxiety disorder) 11/11/2023   GERD (gastroesophageal reflux disease) 11/11/2023   Pulmonary edema 11/11/2023   Elevated blood pressure reading 11/11/2023   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 02/03/2022   OSA on CPAP 01/14/2021   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 Authorizing Provider  acetaminophen  (TYLENOL ) 325 MG tablet Take 2 tablets (650 mg total) by mouth every 6 (six) hours as needed for mild pain (pain score 1-3) or fever (or Fever >/= 101). 03/06/24   Elgergawy, Brayton GORMAN, MD  amLODipine  (NORVASC ) 10 MG tablet Take 1 tablet (10 mg total) by mouth daily. 12/14/23   Ladona Heinz, MD  Continuous Glucose Sensor (DEXCOM G6 SENSOR) MISC Use as instructed to check blood sugar. Change every 10 days 05/24/23   Trixie File, MD  Continuous Glucose Transmitter (DEXCOM G6 TRANSMITTER) MISC Use as instructed. Change every 90 days 05/24/23   Trixie File, MD  esomeprazole  (NEXIUM ) 40 MG capsule Take 40 mg by mouth 2 (two) times daily.    [provider]  fluticasone (FLONASE) 50 MCG/ACT nasal spray Place 1 spray into both nostrils daily as needed for allergies. 06/12/23   [provider]  furosemide  (LASIX ) 20 MG tablet Take 20 mg by mouth daily as needed for edema (for legs).    [provider]  Glucagon  (BAQSIMI  TWO PACK) 3 MG/DOSE POWD Place 3 mg  into the nose once as needed for up to 1 dose. 03/16/20   Trixie File, MD  Insulin  Aspart, w/Niacinamide, (FIASP ) 100 UNIT/ML SOLN INJECT UP TO 50 UNITS UNDER THE SKIN VIA INSULIN  PUMP DAILY 11/14/23   Trixie File, MD  Insulin  Disposable Pump (OMNIPOD 5 DEXG7G6 PODS GEN 5) MISC Use 1 pod every 3 days 05/24/23   Trixie File, MD  isosorbide   mononitrate (IMDUR ) 60 MG 24 hr tablet Take 1 tablet (60 mg total) by mouth daily. 03/24/24   Johnson, Kathleen R, PA-C  levothyroxine  (SYNTHROID ) 75 MCG tablet TAKE 1 TABLET(75 MCG) BY MOUTH DAILY 02/11/22   Trixie File, MD  MAGNESIUM  GLYCINATE PLUS PO Take 1 capsule by mouth daily.    [provider]  metoprolol  succinate (TOPROL -XL) 100 MG 24 hr tablet Take 1 tablet (100 mg total) by mouth daily. Take with or immediately following a meal. 03/24/24   Vicci Rollo SAUNDERS, PA-C  Multiple Vitamins-Minerals (MULTIVITAMINS THER. W/MINERALS) TABS Take 1 tablet by mouth daily.    [provider]  nitroGLYCERIN  (NITROSTAT ) 0.4 MG SL tablet Place 1 tablet (0.4 mg total) under the tongue every 5 (five) minutes as needed for chest pain. 11/14/23 11/13/24  Caleen Burgess BROCKS, MD  prasugrel  (EFFIENT ) 10 MG TABS tablet Take 1 tablet (10 mg total) by mouth daily. 12/05/23   Ladona Heinz, MD  promethazine  (PHENERGAN ) 25 MG tablet Take 25 mg by mouth every 6 (six) hours as needed for nausea.    [provider]  ranolazine  (RANEXA ) 500 MG 12 hr tablet Take 1 tablet (500 mg total) by mouth 2 (two) times daily. 03/20/24   Ladona Heinz, MD  rosuvastatin  (CRESTOR ) 20 MG tablet Take 1 tablet (20 mg total) by mouth daily. 03/20/24 06/18/24  Ladona Heinz, MD  SYRINGE/NEEDLE, DISP, 1 ML 23G X 1 1 ML MISC Use every week 07/05/19   Trixie File, MD  tacrolimus  (PROGRAF ) 1 MG capsule Take 1-2 mg by mouth See admin instructions. Take 2 capsules by mouth in the morning and 1 capsule at night    [provider]  traMADol  (ULTRAM ) 50 MG tablet Take 50 mg by mouth every 6 (six) hours as needed for pain. 05/16/17   [provider]  TUBERCULIN SYR 1CC/25GX5/8 (B-D TB SYRINGE 1CC/25GX5/8) 25G X 5/8 1 ML MISC USE 1 EVERY 2 WEEKS 03/31/20   Trixie File, MD  VELTASSA 8.4 g packet Take 8.4 g by mouth every other day.    [provider]                                                                                                                                     Past Surgical History Past Surgical History:  Procedure Laterality Date   CORONARY ANGIOGRAPHY N/A 11/13/2023   Procedure: CORONARY ANGIOGRAPHY;  Surgeon: Jordan, Peter M, MD;  Location: [facility] INVASIVE CV LAB;  Service: Cardiovascular;  Laterality: N/A;  EYE SURGERY     Family History Family History  Problem Relation Age of Onset   Stroke Mother    Lymphoma Father    Diabetes type II Father    Heart attack Brother    Thyroid  cancer Brother     Social History Social History   Tobacco Use   Smoking status: Never   Smokeless tobacco: Never  Vaping Use   Vaping status: Never Used  Substance Use Topics   Alcohol use: Yes    Comment: Rarely - maybe once a year   Drug use: No   Allergies Lipitor [atorvastatin ], Other, Codeine, and Metoclopramide hcl  Review of Systems Review of Systems  All other systems reviewed and are negative.   Physical Exam Vital Signs  I have reviewed the triage vital signs BP (!) 162/94 (BP Location: Right Arm)   Pulse 100   Temp 98.3 F (36.8 C) (Oral)   Resp 15   Ht 5' 9 (1.753 m)   Wt 92.1 kg   SpO2 99%   BMI 29.98 kg/m  Physical Exam Vitals and nursing note reviewed.  Constitutional:      General: He is not in acute distress.    Appearance: Normal appearance.  HENT:     Mouth/Throat:     Mouth: Mucous membranes are moist.  Eyes:     Conjunctiva/sclera: Conjunctivae normal.  Cardiovascular:     Rate and Rhythm: Normal rate and regular rhythm.     Pulses:          Radial pulses are 2+ on the right side and 2+ on the left side.  Pulmonary:     Effort: Pulmonary effort is normal. No respiratory distress.     Breath sounds: Normal breath sounds.  Abdominal:     General: Abdomen is flat.     Palpations: Abdomen is soft.     Tenderness: There is no abdominal tenderness.  Musculoskeletal:     Right lower leg: No edema.     Left lower leg: No edema.   Skin:    General: Skin is warm and dry.     Capillary Refill: Capillary refill takes less than 2 seconds.  Neurological:     Mental Status: He is alert and oriented to person, place, and time. Mental status is at baseline.  Psychiatric:        Mood and Affect: Mood normal.        Behavior: Behavior normal.     ED Results and Treatments Labs (all labs ordered are listed, but only abnormal results are displayed) Labs Reviewed  BASIC METABOLIC PANEL WITH GFR - Abnormal; Notable for the following components:      Result Value   Potassium 5.7 (*)    CO2 16 (*)    Glucose, Bld 175 (*)    BUN 53 (*)    Creatinine, Ser 2.95 (*)    GFR, Estimated 25 (*)    All other components within normal limits  CBC - Abnormal; Notable for the following components:   RBC 3.54 (*)    Hemoglobin 10.6 (*)    HCT 33.5 (*)    Platelets 447 (*)    All other components within normal limits  TROPONIN I (HIGH SENSITIVITY)  TROPONIN I (HIGH SENSITIVITY)  Radiology DG Chest 2 View Result Date: 03/24/2024 EXAM: 2 VIEW(S) XRAY OF THE CHEST 03/24/2024 11:21:00 AM COMPARISON: 03/03/2024 CLINICAL HISTORY: CP FINDINGS: LUNGS AND PLEURA: No focal pulmonary opacity. No pleural effusion. No pneumothorax. HEART AND MEDIASTINUM: No acute abnormality of the cardiac and mediastinal silhouettes. BONES AND SOFT TISSUES: No acute fracture or destructive lesion. Multilevel thoracic osteophytosis. IMPRESSION: 1. No acute process. Electronically signed by: Evalene Coho MD 03/24/2024 11:31 AM EST RP Workstation: [id number]    Pertinent labs & imaging results that were available during my care of the patient were reviewed by me and considered in my medical decision making (see MDM for details).  Medications Ordered in ED Medications  isosorbide  mononitrate (IMDUR ) 24 hr tablet 60 mg (60 mg Oral Given  03/24/24 1346)  sodium zirconium cyclosilicate  (LOKELMA ) packet 10 g (10 g Oral Given 03/24/24 1141)                                                                                                                                     Procedures Procedures  (including critical care time)  Medical Decision Making / ED Course   MDM:  48 year old presenting to the emergency department with chest pain.  Patient reports chronic symptoms of stable angina which improved with rest.  Has seen cardiology for this recently.  Today pain was more persistent and did not improve with nitroglycerin  however is currently resolved.  EKG is reassuring.  Differential includes ACS, musculoskeletal pain, pneumonia, pneumothorax, gastroparesis, GERD, stable angina.  Low concern for other process such as pulmonary embolism, no pleuritic pain or hypoxia and symptoms have resolved.  Will check labs including troponin x 2.  Initial troponin is normal.  Labs notable for chronic CKD with mild hyperkalemia likely due to underlying CKD.  Will give dose of Lokelma .  Clinical Course as of 03/24/24 1355  Sun Mar 24, 2024  1135 DG Chest 2 View [WS]  1353 Workup is overall reassuring.  Troponin is been negative.  Patient reports he takes Lokelma , typically at home but did not take it today.  Advised follow-up for recheck of potassium as outpatient.  No acute EKG changes to suggest any complication from this do not think he needs for recheck at this time.  Cardiology evaluated patient and felt symptoms are most consistent with stable angina.  They have made some medication adjustments but feel patient is stable for discharge to home.  Discussed with patient is comfortable with the plan. Will discharge patient to home. All questions answered. Patient comfortable with plan of discharge. Return precautions discussed with patient and specified on the after visit summary.  [WS]    Clinical Course User Index [WS] Francesca Elsie CROME, MD     Additional history obtained: -Additional history obtained from ems and spouse -External records from outside source obtained and reviewed including: Chart review including previous notes, labs, imaging, consultation notes including prior cardiology notes  Lab Tests: -I ordered, reviewed, and interpreted labs.   The pertinent results include:   Labs Reviewed  BASIC METABOLIC PANEL WITH GFR - Abnormal; Notable for the following components:      Result Value   Potassium 5.7 (*)    CO2 16 (*)    Glucose, Bld 175 (*)    BUN 53 (*)    Creatinine, Ser 2.95 (*)    GFR, Estimated 25 (*)    All other components within normal limits  CBC - Abnormal; Notable for the following components:   RBC 3.54 (*)    Hemoglobin 10.6 (*)    HCT 33.5 (*)    Platelets 447 (*)    All other components within normal limits  TROPONIN I (HIGH SENSITIVITY)  TROPONIN I (HIGH SENSITIVITY)    Notable for normal troponin, CKD, mild hyperkalemia likely due to CKD   EKG   EKG Interpretation Date/Time:  Sunday March 24 2024 10:36:11 EST Ventricular Rate:  97 PR Interval:  148 QRS Duration:  78 QT Interval:  354 QTC Calculation: 449 R Axis:   45  Text Interpretation: Normal sinus rhythm Low voltage QRS Borderline ECG artifact in lead V1 Confirmed by Francesca Fallow ([id number]) on 03/24/2024 11:05:32 AM         Imaging Studies ordered: I ordered imaging studies including CXR On my interpretation imaging demonstrates no acute process I independently visualized and interpreted imaging. I agree with the radiologist interpretation   Medicines ordered and prescription drug management: Meds ordered this encounter  Medications   sodium zirconium cyclosilicate  (LOKELMA ) packet 10 g   isosorbide  mononitrate (IMDUR ) 24 hr tablet 60 mg   isosorbide  mononitrate (IMDUR ) 60 MG 24 hr tablet    Sig: Take 1 tablet (60 mg total) by mouth daily.    Dispense:  90 tablet    Refill:  1   metoprolol   succinate (TOPROL -XL) 100 MG 24 hr tablet    Sig: Take 1 tablet (100 mg total) by mouth daily. Take with or immediately following a meal.    Dispense:  90 tablet    Refill:  1    -I have reviewed the patients home medicines and have made adjustments as needed   Consultations Obtained: I requested consultation with the cardiologist,  and discussed lab and imaging findings as well as pertinent plan - they recommend: discharge     Reevaluation: After the interventions noted above, I reevaluated the patient and found that their symptoms have resolved  Co morbidities that complicate the patient evaluation  Past Medical History:  Diagnosis Date   Anxiety    Depression    Diabetes mellitus    GERD (gastroesophageal reflux disease)    Hypothyroidism    Kidney replaced by transplant    Pancreas transplanted (HCC)    Thyroid  disease       Dispostion: Disposition decision including need for hospitalization was considered, and patient discharged from emergency department.    Final Clinical Impression(s) / ED Diagnoses Final diagnoses:  Stable angina     This chart was dictated using voice recognition software.  Despite best efforts to proofread,  errors can occur which can change the documentation meaning.    Francesca Fallow CROME, MD 03/24/24 [phone]

## 2024-03-25 ENCOUNTER — Encounter (HOSPITAL_COMMUNITY): Payer: Self-pay

## 2024-03-27 ENCOUNTER — Ambulatory Visit: Payer: Self-pay | Admitting: Cardiology

## 2024-03-27 ENCOUNTER — Ambulatory Visit (HOSPITAL_COMMUNITY)
Admission: RE | Admit: 2024-03-27 | Discharge: 2024-03-27 | Disposition: A | Source: Ambulatory Visit | Attending: Cardiology

## 2024-03-27 DIAGNOSIS — I2089 Other forms of angina pectoris: Secondary | ICD-10-CM | POA: Diagnosis not present

## 2024-03-27 LAB — NM PET CT CARDIAC PERFUSION MULTI W/ABSOLUTE BLOODFLOW
LV dias vol: 124 mL (ref 62–150)
MBFR: 1.14
Nuc Rest EF: 71 %
Nuc Stress EF: 65 %
Peak HR: 107 {beats}/min
Rest HR: 93 {beats}/min
Rest MBF: 1.82 ml/g/min
Rest Nuclear Isotope Dose: 24.1 mCi
ST Depression (mm): 0 mm
Stress MBF: 2.08 ml/g/min
Stress Nuclear Isotope Dose: 23.7 mCi
TID: 1.19

## 2024-03-27 MED ORDER — REGADENOSON 0.4 MG/5ML IV SOLN
0.4000 mg | Freq: Once | INTRAVENOUS | Status: AC
  Administered 2024-03-27: 0.4 mg via INTRAVENOUS

## 2024-03-27 MED ORDER — REGADENOSON 0.4 MG/5ML IV SOLN
INTRAVENOUS | Status: AC
  Filled 2024-03-27: qty 5

## 2024-03-27 MED ORDER — RUBIDIUM RB82 GENERATOR (RUBYFILL)
24.1700 | PACK | Freq: Once | INTRAVENOUS | Status: AC
  Administered 2024-03-27: 24.17 via INTRAVENOUS

## 2024-03-27 MED ORDER — RUBIDIUM RB82 GENERATOR (RUBYFILL)
23.7300 | PACK | Freq: Once | INTRAVENOUS | Status: AC
  Administered 2024-03-27: 23.73 via INTRAVENOUS

## 2024-03-27 NOTE — Progress Notes (Signed)
 He has stage 4 CKD on a transplanted kidney and multiple risks that are now well managed with ongoing obesity issues. We can recommend coronary angiogram and prehydration and hold any nephrotoxic agents including lasix  for 2 days and do the cath and let him know the risk of complete renal failure is at least 5-6% which is very high due to complexity of his Cx lesion which is the culprit.

## 2024-03-28 NOTE — Progress Notes (Signed)
 PET stress results reviewed- showed findings concerning for multi-vessel CAD. I discussed with Dr. Ladona who believed that cardiac catheterization would be reasonable. However, patient does have stage 4 CKD on a transplanted kidney. If we were to cath, would recommend prehydration and hold any nephrotoxic agents including lasix  for 2 days, Risk of complete renal failure at least 5-6%  I called patient to discuss. Patient tells me that since he was seen in the ED and had his anti-anginal medications adjusted on 11/30, his chest pain has improved. He had some chest pain during the PET stress test, but has not had significant chest pain otherwise. Discussed need for close follow up to assess response to medications and discuss if cath is needed. Patient agreed and has an appointment scheduled for 12/16.   Reviewed that if he has chest pain that is not relieved by rest/nitroglycerin , he should go to the ED for further evaluation. Otherwise, continue current medications

## 2024-03-30 ENCOUNTER — Encounter: Payer: Self-pay | Admitting: Cardiology

## 2024-04-09 ENCOUNTER — Encounter: Payer: Self-pay | Admitting: Physician Assistant

## 2024-04-09 ENCOUNTER — Ambulatory Visit: Attending: Internal Medicine | Admitting: Physician Assistant

## 2024-04-09 VITALS — BP 165/94 | HR 72 | Ht 69.0 in | Wt 205.8 lb

## 2024-04-09 MED ORDER — METOPROLOL SUCCINATE ER 50 MG PO TB24: 75.0000 mg | ORAL_TABLET | Freq: Every day | ORAL | 3 refills | Status: DC

## 2024-04-09 MED ORDER — ISOSORBIDE MONONITRATE ER 120 MG PO TB24: 120.0000 mg | ORAL_TABLET | Freq: Every day | ORAL | 3 refills | Status: AC

## 2024-04-09 NOTE — Progress Notes (Unsigned)
 Cardiology Office Note   Date:  04/09/2024  ID:  Daniel Hill, DOB 07-Aug-1975, MRN [id number] PCP: Thurmond Cathlyn LABOR., MD   HeartCare Providers Cardiologist:  Gordy Bergamo, MD Electrophysiologist:  OLE ONEIDA HOLTS, MD { Click to update primary MD,subspecialty MD or APP then REFRESH:1}    History of Present Illness Daniel Hill is a 48 y.o. male with past medical history of renal and pancreatic transplant 2003 and failed pancreatic transplant 2013, type 1 diabetes mellitus with diabetic retinopathy and diabetic nephropathy, stage IV CKD, diabetic gastroparesis, diabetic autonomic insufficiency with orthostatic hypotension, hypercholesterolemia with very mildly elevated Lp(a) at 112, Hashimoto thyroiditis, hypogonadotropic hypogonadism, generalized anxiety disorder, depression, and CAD.  He presented to the hospital in July 2025 with NSTEMI.  Echocardiogram at obtain at the time showed EF 60 to 65%, no regional wall motion abnormality.  Cardiac catheterization at the time showed significant disease in large OM1 and small OM 2, moderate to severe disease in his distal RCA.  Medical therapy was recommended unless he has recurrent angina. He was started on aspirin  and Effient  for 1 year He has worsening postural hypotension recently and has profound fatigue, shortness of breath and intermittent dizziness upon standing.  He was last seen by Dr. Bergamo on 03/20/2024 at which time he had frequent chest tightness and shortness of breath with minimal activity.  He was on Zetia  and BiDil  twice a day.  Although blood pressure is typically high, he noticed a 40-50 point drop in the blood pressure upon standing.  He was prescribed Ranexa  500 mg twice a day for antianginal purpose.  Imdur  was also uptitrated. He was seen in the emergency room on 03/24/2024 with recurrent chest pain.  He is BiDil  was stopped and transition to Imdur .  Metoprolol  succinate was increased to 100 mg daily. Outpatient PET stress  test obtained on 03/27/2024 showed a large defect of moderate reduction in uptake present in the apical to basal lateral location that is reversible, this is consistent with ischemia.  There was also a small defect with moderate reduction in uptake present in the apical to mid inferior location that is reversible.  EF 71%.  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  Patient presents today for follow-up.  We discussed the recent abnormal stress test.  He is aware that stress test showed large area of ischemia.  We discussed possibility of proceeding with cardiac catheterization, however he says his symptom has significantly improved after switching from BiDil  to Imdur .  He eventually opted not to proceed with cardiac catheterization due to fear of progressing to renal failure.  His current creatinine is already 2.9 and followed by Dr. Tobie.  He has increased his Lasix  to 40 mg daily for the past week due to worsening lower extremity edema.  His lung is clear.  He is aware that if we do proceed with cardiac catheterization, he has at least 5 to 6% risk of progressing to renal failure.  Since exertional chest pain has significantly improved, he prefers to hold off on cardiac catheterization unless he fails medical therapy.  His only concern at this time as severe fatigue and erectile dysfunction which I suspect is related to beta-blocker.  After discussing several options, we decided to do a trial to schedule his metoprolol  succinate back down to 75 mg daily and uptitrate Imdur  to 220 mg daily.  He is aware that medical therapy will not reverse the underlying blockage and if his symptom worsens, he will need to seek  urgent medical attention.    ROS: ***  Studies Reviewed      *** Risk Assessment/Calculations {Does this patient have ATRIAL FIBRILLATION?:[id number]} The patient's 1st BP is elevated (>139/89)*** Repeat BP and {Click to enter a 2nd BP Refresh Note  :1}       Physical Exam VS:  BP (!) 165/94  (BP Location: Right Arm, Patient Position: Sitting, Cuff Size: Large)   Pulse 72   Ht 5' 9 (1.753 m)   Wt 205 lb 12.8 oz (93.4 kg)   SpO2 97%   BMI 30.39 kg/m        Wt Readings from Last 3 Encounters:  04/09/24 205 lb 12.8 oz (93.4 kg)  03/24/24 203 lb (92.1 kg)  03/20/24 203 lb 6.4 oz (92.3 kg)    GEN: Well nourished, well developed in no acute distress NECK: No JVD; No carotid bruits CARDIAC: ***RRR, no murmurs, rubs, gallops RESPIRATORY:  Clear to auscultation without rales, wheezing or rhonchi  ABDOMEN: Soft, non-tender, non-distended EXTREMITIES:  No edema; No deformity   ASSESSMENT AND PLAN ***    {Are you ordering a CV Procedure (e.g. stress test, cath, DCCV, TEE, etc)?   Press F2        :[id number]}  Dispo: ***  Signed, Daniel Ford, PA

## 2024-04-09 NOTE — Patient Instructions (Signed)
 Medication Instructions:  DECREASE METOPROLOL  SUCCINATE TO 75 MG DAILY  INCREASE IMDUR  TO 120 MG DAILY *If you need a refill on your cardiac medications before your next appointment, please call your pharmacy*  Lab Work: NO LABS If you have labs (blood work) drawn today and your tests are completely normal, you will receive your results only by: MyChart Message (if you have MyChart) OR A paper copy in the mail If you have any lab test that is abnormal or we need to change your treatment, we will call you to review the results.  Testing/Procedures: NO TESTING  Follow-Up: At [facility], you and your health needs are our priority.  As part of our continuing mission to provide you with exceptional heart care, our providers are all part of one team.  This team includes your primary Cardiologist (physician) and Advanced Practice Providers or APPs (Physician Assistants and Nurse Practitioners) who all work together to provide you with the care you need, when you need it.  Your next appointment:   KEEP FOLLOW UP FEBRUARY 2026   Provider:   Gordy Bergamo, MD    Other Instructions ED PRECAUTIONS FOR CHEST PAIN GIVEN.

## 2024-04-19 ENCOUNTER — Other Ambulatory Visit (HOSPITAL_COMMUNITY): Payer: Self-pay | Admitting: Nephrology

## 2024-04-19 ENCOUNTER — Other Ambulatory Visit: Payer: Self-pay | Admitting: Nephrology

## 2024-04-19 DIAGNOSIS — Z48298 Encounter for aftercare following other organ transplant: Secondary | ICD-10-CM

## 2024-04-19 DIAGNOSIS — N12 Tubulo-interstitial nephritis, not specified as acute or chronic: Secondary | ICD-10-CM

## 2024-04-22 ENCOUNTER — Encounter: Payer: Self-pay | Admitting: Cardiology

## 2024-04-22 MED ORDER — METOPROLOL SUCCINATE ER 50 MG PO TB24: 50.0000 mg | ORAL_TABLET | Freq: Every day | ORAL | Status: AC

## 2024-04-22 NOTE — Telephone Encounter (Signed)
Updated med list to reflect

## 2024-04-24 ENCOUNTER — Encounter: Payer: Self-pay | Admitting: Cardiology

## 2024-04-26 ENCOUNTER — Ambulatory Visit (HOSPITAL_BASED_OUTPATIENT_CLINIC_OR_DEPARTMENT_OTHER)
Admission: RE | Admit: 2024-04-26 | Discharge: 2024-04-26 | Disposition: A | Discharge status: Home / Self Care | Source: Ambulatory Visit | Attending: Nephrology | Admitting: Nephrology

## 2024-04-26 ENCOUNTER — Telehealth (HOSPITAL_BASED_OUTPATIENT_CLINIC_OR_DEPARTMENT_OTHER): Payer: Self-pay

## 2024-04-26 DIAGNOSIS — Z48298 Encounter for aftercare following other organ transplant: Secondary | ICD-10-CM | POA: Insufficient documentation

## 2024-04-26 NOTE — Telephone Encounter (Signed)
 Hold Effient  for 5 days and should continue ASA. Restart Effient  when appropriate per GI

## 2024-04-26 NOTE — Telephone Encounter (Signed)
"  ° °  Pre-operative Risk Assessment    Patient Name: Daniel Hill  DOB: 1975-07-01 MRN: [id number]   Date of last office visit: 04/09/24 with Janene Date of next office visit: 06/18/24 with [facility]  Request for Surgical Clearance    Procedure:  Colonoscopy   Date of Surgery:  Clearance TBD                                 Surgeon:  NA Surgeon's Group or Practice Name:  [facility]  Phone number:  [phone] Fax number:  [phone]   Type of Clearance Requested:   - Medical  - Pharmacy:  Hold Prasugrel  (Effient ) for 7 days prior   Type of Anesthesia:  Not Indicated   Additional requests/questions:    Bonney Augustin JONETTA Delores   04/26/2024, 10:17 AM   "

## 2024-04-26 NOTE — Telephone Encounter (Signed)
 Hey Dr. Ladona, this patient recently had an NSTEMI earlier this year. Are you able to comment on pharmaceutical hold for aspirin  and Effient  for upcoming colonoscopy? Please route your response to P CV DIV PREOP. Thank you!

## 2024-04-26 NOTE — Telephone Encounter (Signed)
 Patient need clearance for Effient  please reschedule colon for Monday 04/29/24.

## 2024-04-26 NOTE — Telephone Encounter (Signed)
 Hey Hao, you recently saw this pt in clinic. Are you able to comment on surgical clearance for upcoming colonoscopy scheduled for TBD? Please route your response to P CV DIV PREOP. Thank you!

## 2024-04-26 NOTE — Telephone Encounter (Signed)
 Thanks team

## 2024-04-29 ENCOUNTER — Inpatient Hospital Stay (HOSPITAL_COMMUNITY)
Admission: EM | Admit: 2024-04-29 | Discharge: 2024-05-03 | DRG: 438 | Disposition: A | Discharge status: Home / Self Care | Attending: Family Medicine | Admitting: Family Medicine

## 2024-04-29 ENCOUNTER — Other Ambulatory Visit: Payer: Self-pay

## 2024-04-29 DIAGNOSIS — K5909 Other constipation: Secondary | ICD-10-CM | POA: Diagnosis present

## 2024-04-29 DIAGNOSIS — T8619 Other complication of kidney transplant: Secondary | ICD-10-CM | POA: Diagnosis present

## 2024-04-29 DIAGNOSIS — E875 Hyperkalemia: Secondary | ICD-10-CM | POA: Diagnosis not present

## 2024-04-29 DIAGNOSIS — K3189 Other diseases of stomach and duodenum: Secondary | ICD-10-CM | POA: Diagnosis present

## 2024-04-29 DIAGNOSIS — Z7902 Long term (current) use of antithrombotics/antiplatelets: Secondary | ICD-10-CM | POA: Diagnosis not present

## 2024-04-29 DIAGNOSIS — E039 Hypothyroidism, unspecified: Secondary | ICD-10-CM | POA: Diagnosis present

## 2024-04-29 DIAGNOSIS — I129 Hypertensive chronic kidney disease with stage 1 through stage 4 chronic kidney disease, or unspecified chronic kidney disease: Secondary | ICD-10-CM | POA: Diagnosis present

## 2024-04-29 DIAGNOSIS — D849 Immunodeficiency, unspecified: Secondary | ICD-10-CM

## 2024-04-29 DIAGNOSIS — K219 Gastro-esophageal reflux disease without esophagitis: Secondary | ICD-10-CM | POA: Diagnosis present

## 2024-04-29 DIAGNOSIS — K8592 Acute pancreatitis with infected necrosis, unspecified: Secondary | ICD-10-CM | POA: Diagnosis present

## 2024-04-29 DIAGNOSIS — D84821 Immunodeficiency due to drugs: Secondary | ICD-10-CM | POA: Diagnosis present

## 2024-04-29 DIAGNOSIS — R509 Fever, unspecified: Secondary | ICD-10-CM | POA: Diagnosis present

## 2024-04-29 DIAGNOSIS — E1043 Type 1 diabetes mellitus with diabetic autonomic (poly)neuropathy: Secondary | ICD-10-CM | POA: Diagnosis present

## 2024-04-29 DIAGNOSIS — Z940 Kidney transplant status: Secondary | ICD-10-CM

## 2024-04-29 DIAGNOSIS — K8689 Other specified diseases of pancreas: Secondary | ICD-10-CM | POA: Diagnosis not present

## 2024-04-29 DIAGNOSIS — Z807 Family history of other malignant neoplasms of lymphoid, hematopoietic and related tissues: Secondary | ICD-10-CM

## 2024-04-29 DIAGNOSIS — Z79890 Hormone replacement therapy (postmenopausal): Secondary | ICD-10-CM

## 2024-04-29 DIAGNOSIS — R188 Other ascites: Secondary | ICD-10-CM | POA: Diagnosis not present

## 2024-04-29 DIAGNOSIS — R933 Abnormal findings on diagnostic imaging of other parts of digestive tract: Secondary | ICD-10-CM

## 2024-04-29 DIAGNOSIS — Z9641 Presence of insulin pump (external) (internal): Secondary | ICD-10-CM | POA: Diagnosis present

## 2024-04-29 DIAGNOSIS — F419 Anxiety disorder, unspecified: Secondary | ICD-10-CM | POA: Diagnosis present

## 2024-04-29 DIAGNOSIS — G4733 Obstructive sleep apnea (adult) (pediatric): Secondary | ICD-10-CM | POA: Diagnosis not present

## 2024-04-29 DIAGNOSIS — N184 Chronic kidney disease, stage 4 (severe): Secondary | ICD-10-CM | POA: Diagnosis present

## 2024-04-29 DIAGNOSIS — Z9483 Pancreas transplant status: Secondary | ICD-10-CM

## 2024-04-29 DIAGNOSIS — E8721 Acute metabolic acidosis: Secondary | ICD-10-CM | POA: Diagnosis present

## 2024-04-29 DIAGNOSIS — I252 Old myocardial infarction: Secondary | ICD-10-CM

## 2024-04-29 DIAGNOSIS — R112 Nausea with vomiting, unspecified: Secondary | ICD-10-CM | POA: Diagnosis present

## 2024-04-29 DIAGNOSIS — F32A Depression, unspecified: Secondary | ICD-10-CM | POA: Diagnosis present

## 2024-04-29 DIAGNOSIS — I2510 Atherosclerotic heart disease of native coronary artery without angina pectoris: Secondary | ICD-10-CM | POA: Diagnosis not present

## 2024-04-29 DIAGNOSIS — Z79899 Other long term (current) drug therapy: Secondary | ICD-10-CM

## 2024-04-29 DIAGNOSIS — Z885 Allergy status to narcotic agent status: Secondary | ICD-10-CM

## 2024-04-29 DIAGNOSIS — Z79621 Long term (current) use of calcineurin inhibitor: Secondary | ICD-10-CM | POA: Diagnosis not present

## 2024-04-29 DIAGNOSIS — E1022 Type 1 diabetes mellitus with diabetic chronic kidney disease: Secondary | ICD-10-CM | POA: Diagnosis present

## 2024-04-29 DIAGNOSIS — K2950 Unspecified chronic gastritis without bleeding: Secondary | ICD-10-CM | POA: Diagnosis not present

## 2024-04-29 DIAGNOSIS — N179 Acute kidney failure, unspecified: Secondary | ICD-10-CM | POA: Diagnosis present

## 2024-04-29 DIAGNOSIS — Z823 Family history of stroke: Secondary | ICD-10-CM

## 2024-04-29 DIAGNOSIS — T86898 Other complications of other transplanted tissue: Principal | ICD-10-CM | POA: Diagnosis present

## 2024-04-29 DIAGNOSIS — D631 Anemia in chronic kidney disease: Secondary | ICD-10-CM | POA: Diagnosis present

## 2024-04-29 DIAGNOSIS — Z794 Long term (current) use of insulin: Secondary | ICD-10-CM

## 2024-04-29 DIAGNOSIS — I2089 Other forms of angina pectoris: Secondary | ICD-10-CM | POA: Insufficient documentation

## 2024-04-29 DIAGNOSIS — Y830 Surgical operation with transplant of whole organ as the cause of abnormal reaction of the patient, or of later complication, without mention of misadventure at the time of the procedure: Secondary | ICD-10-CM | POA: Diagnosis present

## 2024-04-29 DIAGNOSIS — K828 Other specified diseases of gallbladder: Secondary | ICD-10-CM | POA: Diagnosis present

## 2024-04-29 DIAGNOSIS — Z789 Other specified health status: Secondary | ICD-10-CM

## 2024-04-29 DIAGNOSIS — Z888 Allergy status to other drugs, medicaments and biological substances status: Secondary | ICD-10-CM

## 2024-04-29 DIAGNOSIS — E785 Hyperlipidemia, unspecified: Secondary | ICD-10-CM

## 2024-04-29 DIAGNOSIS — N183 Chronic kidney disease, stage 3 unspecified: Secondary | ICD-10-CM | POA: Insufficient documentation

## 2024-04-29 DIAGNOSIS — K8500 Idiopathic acute pancreatitis without necrosis or infection: Secondary | ICD-10-CM | POA: Diagnosis not present

## 2024-04-29 DIAGNOSIS — Z808 Family history of malignant neoplasm of other organs or systems: Secondary | ICD-10-CM

## 2024-04-29 DIAGNOSIS — K859 Acute pancreatitis without necrosis or infection, unspecified: Secondary | ICD-10-CM

## 2024-04-29 DIAGNOSIS — Z881 Allergy status to other antibiotic agents status: Secondary | ICD-10-CM

## 2024-04-29 DIAGNOSIS — F418 Other specified anxiety disorders: Secondary | ICD-10-CM | POA: Diagnosis not present

## 2024-04-29 DIAGNOSIS — I25118 Atherosclerotic heart disease of native coronary artery with other forms of angina pectoris: Secondary | ICD-10-CM | POA: Diagnosis present

## 2024-04-29 DIAGNOSIS — K3184 Gastroparesis: Secondary | ICD-10-CM | POA: Diagnosis present

## 2024-04-29 DIAGNOSIS — Z833 Family history of diabetes mellitus: Secondary | ICD-10-CM

## 2024-04-29 DIAGNOSIS — N1832 Chronic kidney disease, stage 3b: Secondary | ICD-10-CM | POA: Diagnosis not present

## 2024-04-29 DIAGNOSIS — Z8249 Family history of ischemic heart disease and other diseases of the circulatory system: Secondary | ICD-10-CM

## 2024-04-29 DIAGNOSIS — R079 Chest pain, unspecified: Secondary | ICD-10-CM | POA: Diagnosis not present

## 2024-04-29 LAB — CBC WITH DIFFERENTIAL/PLATELET
Abs Immature Granulocytes: 0.02 K/uL (ref 0.00–0.07)
Basophils Absolute: 0.1 K/uL (ref 0.0–0.1)
Basophils Relative: 1 %
Eosinophils Absolute: 0.2 K/uL (ref 0.0–0.5)
Eosinophils Relative: 2 %
HCT: 34.2 % — ABNORMAL LOW (ref 39.0–52.0)
Hemoglobin: 11.1 g/dL — ABNORMAL LOW (ref 13.0–17.0)
Immature Granulocytes: 0 %
Lymphocytes Relative: 22 %
Lymphs Abs: 2.2 K/uL (ref 0.7–4.0)
MCH: 30.2 pg (ref 26.0–34.0)
MCHC: 32.5 g/dL (ref 30.0–36.0)
MCV: 92.9 fL (ref 80.0–100.0)
Monocytes Absolute: 0.7 K/uL (ref 0.1–1.0)
Monocytes Relative: 7 %
Neutro Abs: 6.6 K/uL (ref 1.7–7.7)
Neutrophils Relative %: 68 %
Platelets: 504 K/uL — ABNORMAL HIGH (ref 150–400)
RBC: 3.68 MIL/uL — ABNORMAL LOW (ref 4.22–5.81)
RDW: 13.4 % (ref 11.5–15.5)
WBC: 9.8 K/uL (ref 4.0–10.5)
nRBC: 0 % (ref 0.0–0.2)

## 2024-04-29 LAB — I-STAT CG4 LACTIC ACID, ED: Lactic Acid, Venous: 0.7 mmol/L (ref 0.5–1.9)

## 2024-04-29 LAB — URINALYSIS, ROUTINE W REFLEX MICROSCOPIC
Bacteria, UA: NONE SEEN
Bilirubin Urine: NEGATIVE
Glucose, UA: 50 mg/dL — AB
Hgb urine dipstick: NEGATIVE
Ketones, ur: NEGATIVE mg/dL
Leukocytes,Ua: NEGATIVE
Nitrite: NEGATIVE
Protein, ur: >=300 mg/dL — AB
Specific Gravity, Urine: 1.009 (ref 1.005–1.030)
pH: 5 (ref 5.0–8.0)

## 2024-04-29 LAB — COMPREHENSIVE METABOLIC PANEL WITH GFR
ALT: 17 U/L (ref 0–44)
AST: 17 U/L (ref 15–41)
Albumin: 4 g/dL (ref 3.5–5.0)
Alkaline Phosphatase: 127 U/L — ABNORMAL HIGH (ref 38–126)
Anion gap: 12 (ref 5–15)
BUN: 51 mg/dL — ABNORMAL HIGH (ref 6–20)
CO2: 18 mmol/L — ABNORMAL LOW (ref 22–32)
Calcium: 9.9 mg/dL (ref 8.9–10.3)
Chloride: 108 mmol/L (ref 98–111)
Creatinine, Ser: 2.71 mg/dL — ABNORMAL HIGH (ref 0.61–1.24)
GFR, Estimated: 28 mL/min — ABNORMAL LOW
Glucose, Bld: 177 mg/dL — ABNORMAL HIGH (ref 70–99)
Potassium: 4.8 mmol/L (ref 3.5–5.1)
Sodium: 138 mmol/L (ref 135–145)
Total Bilirubin: 0.3 mg/dL (ref 0.0–1.2)
Total Protein: 7.8 g/dL (ref 6.5–8.1)

## 2024-04-29 LAB — URINALYSIS, W/ REFLEX TO CULTURE (INFECTION SUSPECTED)
Bilirubin Urine: NEGATIVE
Glucose, UA: NEGATIVE mg/dL
Hgb urine dipstick: NEGATIVE
Ketones, ur: NEGATIVE mg/dL
Leukocytes,Ua: NEGATIVE
Nitrite: NEGATIVE
Protein, ur: >=300 mg/dL — AB
Specific Gravity, Urine: 1.01 (ref 1.005–1.030)
pH: 5 (ref 5.0–8.0)

## 2024-04-29 LAB — ETHANOL: Alcohol, Ethyl (B): <15 mg/dL

## 2024-04-29 LAB — TROPONIN T, HIGH SENSITIVITY
Troponin T High Sensitivity: 18 ng/L (ref 0–19)
Troponin T High Sensitivity: 17 ng/L (ref 0–19)

## 2024-04-29 LAB — LIPASE, BLOOD: Lipase: <10 U/L — ABNORMAL LOW (ref 11–51)

## 2024-04-29 MED ORDER — ACETAMINOPHEN 325 MG PO TABS: 650.0000 mg | ORAL_TABLET | Freq: Four times a day (QID) | ORAL | Status: DC | PRN

## 2024-04-29 MED ORDER — SODIUM CHLORIDE 0.9 % IV BOLUS
1000.0000 mL | Freq: Once | INTRAVENOUS | Status: AC
  Administered 2024-04-29: 1000 mL via INTRAVENOUS

## 2024-04-29 MED ORDER — MORPHINE SULFATE (PF) 4 MG/ML IV SOLN: 4.0000 mg | Freq: Once | INTRAVENOUS | Status: DC

## 2024-04-29 MED ORDER — RANOLAZINE ER 500 MG PO TB12: 500.0000 mg | ORAL_TABLET | Freq: Two times a day (BID) | ORAL | Status: DC

## 2024-04-29 MED ORDER — FLUTICASONE PROPIONATE 50 MCG/ACT NA SUSP
1.0000 | Freq: Every day | NASAL | Status: DC | PRN
  Filled 2024-04-29: qty 16

## 2024-04-29 MED ORDER — TACROLIMUS 1 MG PO CAPS
1.0000 mg | ORAL_CAPSULE | Freq: Every day | ORAL | Status: DC
  Administered 2024-04-29 – 2024-05-02 (×4): 1 mg via ORAL
  Filled 2024-04-29 (×4): qty 1

## 2024-04-29 MED ORDER — SODIUM CHLORIDE 0.9 % IV SOLN
2.0000 g | INTRAVENOUS | Status: DC
  Administered 2024-04-30 – 2024-05-01 (×2): 2 g via INTRAVENOUS
  Filled 2024-04-29 (×3): qty 20

## 2024-04-29 MED ORDER — PRASUGREL HCL 10 MG PO TABS
10.0000 mg | ORAL_TABLET | Freq: Every day | ORAL | Status: DC
  Administered 2024-04-30 – 2024-05-03 (×4): 10 mg via ORAL
  Filled 2024-04-29 (×4): qty 1

## 2024-04-29 MED ORDER — METOPROLOL SUCCINATE ER 50 MG PO TB24
50.0000 mg | ORAL_TABLET | Freq: Every day | ORAL | Status: DC
  Administered 2024-04-30 – 2024-05-03 (×4): 50 mg via ORAL
  Filled 2024-04-29 (×2): qty 1
  Filled 2024-04-29: qty 2
  Filled 2024-04-29: qty 1

## 2024-04-29 MED ORDER — METRONIDAZOLE 500 MG/100ML IV SOLN
500.0000 mg | Freq: Once | INTRAVENOUS | Status: AC
  Administered 2024-04-29: 500 mg via INTRAVENOUS
  Filled 2024-04-29: qty 100

## 2024-04-29 MED ORDER — HEPARIN SODIUM (PORCINE) 5000 UNIT/ML IJ SOLN
5000.0000 [IU] | Freq: Three times a day (TID) | INTRAMUSCULAR | Status: DC
  Administered 2024-04-29 – 2024-05-03 (×11): 5000 [IU] via SUBCUTANEOUS
  Filled 2024-04-29 (×11): qty 1

## 2024-04-29 MED ORDER — SODIUM CHLORIDE 0.9 % IV SOLN
1.0000 g | Freq: Once | INTRAVENOUS | Status: AC
  Administered 2024-04-29: 1 g via INTRAVENOUS
  Filled 2024-04-29: qty 10

## 2024-04-29 MED ORDER — TACROLIMUS 1 MG PO CAPS
2.0000 mg | ORAL_CAPSULE | Freq: Every morning | ORAL | Status: DC
  Administered 2024-04-30 – 2024-05-03 (×4): 2 mg via ORAL
  Filled 2024-04-29 (×4): qty 2

## 2024-04-29 MED ORDER — PROMETHAZINE HCL 25 MG PO TABS
25.0000 mg | ORAL_TABLET | Freq: Four times a day (QID) | ORAL | Status: DC | PRN
  Administered 2024-04-29 – 2024-05-01 (×2): 25 mg via ORAL
  Filled 2024-04-29 (×2): qty 1

## 2024-04-29 MED ORDER — METRONIDAZOLE 500 MG/100ML IV SOLN
500.0000 mg | Freq: Two times a day (BID) | INTRAVENOUS | Status: DC
  Administered 2024-04-30 – 2024-05-02 (×5): 500 mg via INTRAVENOUS
  Filled 2024-04-29 (×5): qty 100

## 2024-04-29 MED ORDER — PANTOPRAZOLE SODIUM 40 MG PO TBEC
40.0000 mg | DELAYED_RELEASE_TABLET | Freq: Every day | ORAL | Status: DC
  Filled 2024-04-29: qty 1

## 2024-04-29 MED ORDER — SODIUM CHLORIDE 0.9 % IV SOLN: INTRAVENOUS | Status: DC

## 2024-04-29 MED ORDER — POLYETHYLENE GLYCOL 3350 17 G PO PACK: 17.0000 g | PACK | Freq: Every day | ORAL | Status: DC | PRN

## 2024-04-29 MED ORDER — ONDANSETRON HCL 4 MG/2ML IJ SOLN: 4.0000 mg | Freq: Once | INTRAMUSCULAR | Status: DC

## 2024-04-29 MED ORDER — ISOSORBIDE MONONITRATE ER 60 MG PO TB24
120.0000 mg | ORAL_TABLET | Freq: Every day | ORAL | Status: DC
  Administered 2024-04-30 – 2024-05-03 (×4): 120 mg via ORAL
  Filled 2024-04-29 (×2): qty 2
  Filled 2024-04-29: qty 4
  Filled 2024-04-29: qty 2

## 2024-04-29 MED ORDER — TACROLIMUS 1 MG PO CAPS: 1.0000 mg | ORAL_CAPSULE | ORAL | Status: DC

## 2024-04-29 MED ORDER — ROSUVASTATIN CALCIUM 20 MG PO TABS: 20.0000 mg | ORAL_TABLET | Freq: Every day | ORAL | Status: DC

## 2024-04-29 MED ORDER — PATIROMER SORBITEX CALCIUM 8.4 G PO PACK
8.4000 g | PACK | ORAL | Status: DC
  Filled 2024-04-29 (×3): qty 1

## 2024-04-29 MED ORDER — LEVOTHYROXINE SODIUM 75 MCG PO TABS
75.0000 ug | ORAL_TABLET | Freq: Every day | ORAL | Status: DC
  Administered 2024-04-30 – 2024-05-03 (×4): 75 ug via ORAL
  Filled 2024-04-29 (×4): qty 1

## 2024-04-29 NOTE — Assessment & Plan Note (Signed)
 CKD stage 3a. Unclear baseline creatinine.  Patient has insulin  pump and CGM; on chart review sugars well-managed. - Continue home insulin  pump - low threshold to initiate EndoTool if sugars consistently >300 overnight - question possible pyelonephritis; will obtain urine culture - consult Nephrology for any further recommendations

## 2024-04-29 NOTE — Telephone Encounter (Signed)
"  ° °  Patient Name: Daniel Hill  DOB: 29-Sep-1975 MRN: [id number]  Primary Cardiologist: Gordy Bergamo, MD  Chart reviewed as part of pre-operative protocol coverage. Given past medical history and time since last visit, based on ACC/AHA guidelines, [name] is at acceptable risk for the planned procedure without further cardiovascular testing.   Yes. It is a low risk procedure. How ever be aware, complication can occur rarely, and that risk has to be accepted by you as probably was discussed by your GI.  Per Dr.Gangi   Again, I don't think it is prohibitory for [name] to proceed given the low risk nature of the procedure, as long as he is aware that he would be a high risk candidate going through a low risk procedure. Per Scot Ford., PA  Hold Effient  for 5 days and should continue ASA. Restart Effient  when appropriate per GI   The patient was advised that if he develops new symptoms prior to surgery to contact our office to arrange for a follow-up visit, and he verbalized understanding.  I will route this recommendation to the requesting party via Epic fax function and remove from pre-op pool.  Please call with questions.  Lamarr Satterfield, NP 04/29/2024, 11:16 AM  "

## 2024-04-29 NOTE — Assessment & Plan Note (Addendum)
 CKD stage 3a. Unclear baseline creatinine.  Patient has insulin  pump and CGM; on chart review sugars well-managed. - Continue home insulin  pump - low threshold to initiate EndoTool if sugars consistently >300 overnight - question possible pyelonephritis; will obtain urine culture - consult Nephrology for any further recommendations

## 2024-04-29 NOTE — ED Triage Notes (Signed)
 N/v x 5-6 weeks. Had a scan last week that showed pancreatitis. 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 Emesis x 15 in the last 24 hours-unable to tolerate any PO intake. Advised by nephrology to come to the ED for IV abx.

## 2024-04-29 NOTE — Hospital Course (Addendum)
 Daniel Hill is a 49 y.o.male with a history of T1DM, renal and pancreatic transplant, CKD stage 3b who was admitted to the [facility] Medicine Teaching Service at [facility] for abdominal pain and vomiting suspected to be pancreatitis.   His hospital course is detailed below:  Nausea and vomiting suspect in the setting of pancreatitis Cyclic vomiting syndrome Presented with abdominal pain, nausea, vomiting suspected secondary to pancreatitis.  CT abdomen pelvis from 1/2 concerning for increased soft tissue edema and stranding of left lower quadrant transplant kidney, diffuse pancreatic atrophy of the native pancreas with surrounding infiltration and edema.  Lipase on admission was normal. Alk phos mildly elevated at 127. Troponins flat. UA unremarkable, urine culture showed no growth.  He was given Rocephin  and Flagyl  (04/29/24-05/02/24) and diet was progressed as tolerated. Blood cultures showed no growth. [facility] transplant team was contacted who recommended treatment of pancreatitis without consideration of transplanted pancreas given his transplanted pancreas has greatly atrophied. They also recommended testing of tacrolimus  level as this can cause drug-induced pancreatitis. Tacrolimus  level was within normal limits. RUQ ultrasound was performed for persistent nausea throughout hospital course and was significant for trace pericholecystic fluid with borderline gallbladder wall thickening and positive sonographic murphy sign without any gallstones. Concern for acalculous cholecystitis versus edema/anasarca. HIDA scan was obtained which was negative for cholecystitis and showed biliary dyskinesia. Patient was started on bentyl  for this. Additionally fecal elastase was borderline insufficient so patient was restarted on creon .  Antibiotics were discontinued on 05/02/2024 given no cholecystitis. Prior to discharge, his symptoms were greatly improved and he was tolerating a regular diet without nausea and vomiting. He did  have watery diarrhea however this was thought to be from antibiotics. He was given a dose of imodium  on discharge with expectations for the diarrhea to resolve as the patient resumes a regular diet. GI did not think this was true pancreatitis after full workup and recommended discussing the addition of a TCA to his medication regimen for probable cyclic vomiting syndrome. He was advised to have close follow up with both PCP and GI on discharge.   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 Patient had perinephric stranding on imagining around the kidney. Urine culture with no growth. Within baseline, 2.66 on discharge.   Stable angina  Patient has had stable angina since before his MI in July of 2025. He was prescribed Ranexa  by his cardiologist for persistent stable angina with exertion. He did not tolerate the Ranexa  as it caused nausea and vomiting and stopped taking it 2-3 months ago. The patient did not complain of chest pain at any time during hospitalization, however, with history and complexity of patient history as well as no statin use in at least 2 months, an echocardiogram was obtained and significant for hyperdynamic left ventricle with EF > 75% and small pericardial effusion. No regional wall abnormalities.    Other chronic conditions were medically managed with home medications and formulary alternatives as necessary  PCP Follow-up Recommendations: Follow up with cardiology regarding stable angina. Physical therapy recommends cardiac rehab after discharge.  If pt has issues with abd pain and concurrent n/v can f/u as outpt with general surgery for elective lap chole. EGD recommended to consider treatment for cyclic vomiting syndrome with a TCA. This can be discussed with his outpatient gastroenterologist.

## 2024-04-29 NOTE — Plan of Care (Signed)
 FMTS Brief Progress Note  S:Evaluated pt at bedside with Dr. Jerrie.  He has no concerns at this time.  He received Phenergan  which improved his nausea and vomiting.  He reports mild epigastric pain.  He has had intermittent left-sided back pain since receiving contrast in December with reported decreased urine output.  No dysuria or hematuria.   O: BP (!) 170/89 (BP Location: Right Arm)   Pulse 76   Temp 97.8 F (36.6 C) (Oral)   Resp 18   SpO2 100%   General: well appearing, NAD Respiratory: normal work of breathing on RA Abdomen: Normal bowel sounds, soft, mild TTP epigastrium Back: No cva tenderness bilaterally  A/P: Nausea/Vomiting/Abd pain in setting of suspected pancreatitis vs pyelonephritis. -Will consult nephrology - Continue ceftriaxone , Flagyl  - Continue Phenergan  25 mg every 6 as needed -Urine culture pending, follow-up blood cultures - Continue IV fluids, advance diet as tolerated Rest of plan per day team   - Orders reviewed. Labs for AM ordered, which was adjusted as needed.   Kristinia Leavy, Elyce, DO 04/29/2024, 8:08 PM PGY-2, [facility] Night Resident  Please page [phone] with questions.

## 2024-04-29 NOTE — ED Notes (Signed)
 Per [place], [place]: increase the patient's acuity to a 2.

## 2024-04-29 NOTE — H&P (Signed)
 "    Hospital Admission History and Physical Service Pager: [phone]  Patient name: Daniel Hill Medical record number: [id number] Date of Birth: 08/21/1975 Age: 49 y.o. Gender: male  Primary Care Provider: Thurmond Cathlyn LABOR., MD Consultants: none Code Status: FULL which was confirmed with family if patient unable to confirm   Preferred Emergency Contact:  Contact Information     Name Relation Home Work Mobile   Felaree-Balentine,Deanna Spouse   [phone]      Other Contacts   None on File    Chief Complaint: abdominal pain  Assessment and Plan: Daniel Hill is a 49 y.o. male presenting with abdominal pain, vomiting for several days . Differential for presentation of this includes:  Pancreatitis - concerning given recent outpatient CT and symptoms of abdominal pain, vomiting. Question some type of autoimmune reaction from transplant, graft vs host disease?  Pyelonephritis - also possible given his recent CT findings and symptoms; additionally he has had this in the past with similar symptoms.  Transient GI illness - less likely given no sick contacts and prolonged course of symptoms. Assessment & Plan Pancreatitis Pancreas transplanted (HCC) - Admit to FMTS, attending Dr. 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 Unclear baseline creatinine.  Patient has insulin  pump and CGM; on chart review sugars well-managed. - Continue home insulin  pump - low threshold to initiate EndoTool if sugars consistently >300  overnight - question possible pyelonephritis; will obtain urine culture - consult Nephrology for any further recommendations Chronic health problem HTN - holding home BP regimen in the setting of possible developing sepsis; CTM pressures and re-add agents as appropriate.  FEN/GI: NPO, advance as tolerated (if fully advanced note dietary restrictions: heart healthy, renal, potassium, phosphorus) VTE Prophylaxis: Heparin   Disposition: Progressive  History of Present Illness:  Daniel Hill is a 49 y.o. male presenting with abdominal pain worsening in the last days to weeks. He was seen by his nephrologist outpatient who started him on abx and ordered CT. Initially abx seemed to help symptoms but gradually he felt poorly again; simultaneously imaging results returned with evidence of pancreatitis and he was advised to come to ED.  He is unable to eat much at baseline, however he has particularly had a hard time eating recently due to frequent vomiting of yellow/frothy substance. Has been drinking/staying hydrated. Reports low grade fevers at home intermittently.  He follows with [facility] (PCP), [facility]. He does have history of renal and pancreatic transplant in 2003, T1DM. Previously admitted with pancreatitis in November 2025; at that time completed course of zosyn  and discharged home.  In the ED, lipase WNL though this may be seen in transplant patients with pancreatitis. Blood cultures collected. He received 1L IVF, and one dose each of CTX, flagyl . Called for admission for continued IVF, abx, and further workup as indicated.  Review Of Systems: Per HPI.  Pertinent Past Medical History: T1DM Pyelonephritis Remainder reviewed in history tab.   Pertinent Past Surgical History: Transplants 2003 Hernia repair  Remainder reviewed in history  tab.   Pertinent Social History: Tobacco use: No Alcohol use: No Other Substance use: No Lives with wife  Pertinent Family  History: Not reviewed. Remainder reviewed in history tab.   Important Outpatient Medications: Please review medication reconciliation. Remainder reviewed in medication history.   Objective: BP (!) 170/89 (BP Location: Right Arm)   Pulse 76   Temp 97.8 F (36.6 C) (Oral)   Resp 18   SpO2 100%  Exam: General: chronically ill-appearing, no acute distress. HEENT: normocephalic, EOM grossly intact, MMM. Nares patent. No LAD, thyromegaly. Cardio: Regular rate, regular rhythm, no murmurs on exam. Pulm: Clear, no wheezing, no crackles. No increased work of breathing. Abdominal: bowel sounds present, soft, moderately tender in epigastric region. No CVA tenderness. Non-distended. No HSM. Extremities: No peripheral edema. Moves all extremities equally. Skin: intact, no rashes. Warm, well-perfused. Neuro: Alert and oriented x3, speech normal in content, no facial asymmetry. Psych:  Cognition and judgment appear intact. Alert, communicative, and cooperative with normal attention span and concentration.   Labs:  CBC BMET  Recent Labs  Lab 04/29/24 1522  WBC 9.8  HGB 11.1*  HCT 34.2*  PLT 504*   Recent Labs  Lab 04/29/24 1522  NA 138  K 4.8  CL 108  CO2 18*  BUN 51*  CREATININE 2.71*  GLUCOSE 177*  CALCIUM  9.9    Lactic acid 0.7 Troponin 18 > 17 Lipase <10 UA with glucose, protein Ethanol negative  EKG: NSR, specifically no QTc prolongation  1/2 OUTPATIENT CT A/P: IMPRESSION: 1. Left lower quadrant transplant kidney without hydronephrosis or focal mass. Increased soft tissue edema and stranding. Correlate for possible infection or inflammation infection or rejection. No loculated collections. 2. Increased lymphadenopathy in the external iliac chains bilaterally, retroperitoneum, and celiac axis since the prior study, most likely representing inflammatory or infectious lymph nodes. 3. Diffuse pancreatic atrophy of the native pancreas with  surrounding infiltration/edema, which may reflect pancreatitis; consider correlation with serum lipase.  Lafe Domino, DO 04/29/2024, 7:13 PM PGY-2, [facility]  FPTS Intern pager: [phone], text pages welcome Secure chat group [facility] Teaching Service  "

## 2024-04-29 NOTE — Assessment & Plan Note (Signed)
-   Admit to FMTS, attending Dr. Orie - Progressive, Vital signs per floor - PT/OT to treat - NPO, advance diet as tolerated - IVF 100mL/hr - f/u blood cultures collected in ED - Continue antibiotics: CTX, Flagyl  - AM CBC, CMP - phenergan  25 mg q6h PRN for nausea, vomiting - continue prograf   - Pain: acetaminophen  q6h PRN; consider morphine  for severe pain - consider consult to General Surgery

## 2024-04-29 NOTE — Assessment & Plan Note (Signed)
 HTN - holding home BP regimen in the setting of possible developing sepsis; CTM pressures and re-add agents as appropriate.

## 2024-04-29 NOTE — Telephone Encounter (Signed)
 Although the actual procedure is low risk procedure, however unfortunately, the patient by nature is a high risk patient going for a low risk procedure. He had a recent abnormal PET stress test that showed large defect of moderate reduction in uptake present in the apical to basal lateral location that is reversible, this is consistent with ischemia. There was also a small defect with moderate reduction in uptake present in the apical to mid inferior location that is reversible. EF 71%. 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 We had discussed risk and benefit of cath, ultimately we decided to medically manage his CAD since he had high risk of contrast nephropathy from contrast dye due to baseline poor renal function. As long as GI service understand his higher than normal risk, I don't think the procedure would be completely prohibitory for him to proceed.

## 2024-04-29 NOTE — Plan of Care (Signed)
 Called Nephrologist on call who did not recommend treatment for pyelonephritis as UA does not show evidence of infection at this time. Can consider consult if patient is clinically worsening.

## 2024-04-29 NOTE — ED Provider Notes (Signed)
 " [place] [facility] DEPARTMENT AT [facility] Provider Note   CSN: [id number] Arrival date & time: 04/29/24  1319     Patient presents with: No chief complaint on file.   Daniel Hill is a 49 y.o. male history of pancreatic transplant, diabetes, kidney transplant here presenting with abdominal pain.  Patient states that he was recently admitted for pancreatitis back in November.  He finished several days of Zosyn  and ID was consulted and did not recommend further antibiotics.  Patient states that for the last week or so he has been having worsening epigastric pain and nausea.  He went to see nephrology and had CT abdomen pelvis done 3 days ago that showed pancreatitis.  Patient was prescribed antibiotics but states that he is unable to keep it down.  The result of the CT scan came back today and patient was told that he needs to have antibiotics.  Patient has been running low-grade temperature.  Patient is on Prograf  for immunosuppression   HPI     Prior to Admission medications  Medication Sig Start Date End Date Taking? Authorizing Provider  acetaminophen  (TYLENOL ) 325 MG tablet Take 2 tablets (650 mg total) by mouth every 6 (six) hours as needed for mild pain (pain score 1-3) or fever (or Fever >/= 101). 03/06/24   Elgergawy, Brayton GORMAN, MD  amLODipine  (NORVASC ) 10 MG tablet Take 1 tablet (10 mg total) by mouth daily. 12/14/23   Ladona Heinz, MD  Continuous Glucose Sensor (DEXCOM G6 SENSOR) MISC Use as instructed to check blood sugar. Change every 10 days 05/24/23   Trixie File, MD  Continuous Glucose Transmitter (DEXCOM G6 TRANSMITTER) MISC Use as instructed. Change every 90 days 05/24/23   Trixie File, MD  esomeprazole  (NEXIUM ) 40 MG capsule Take 40 mg by mouth 2 (two) times daily.    [provider]  fluticasone  (FLONASE ) 50 MCG/ACT nasal spray Place 1 spray into both nostrils daily as needed for allergies. 06/12/23   [provider]  furosemide   (LASIX ) 20 MG tablet Take 20 mg by mouth daily as needed for edema (for legs). Patient not taking: Reported on 04/09/2024    [provider]  furosemide  (LASIX ) 40 MG tablet Take 40 mg by mouth daily. 03/28/24   [provider]  Glucagon  (BAQSIMI  TWO PACK) 3 MG/DOSE POWD Place 3 mg into the nose once as needed for up to 1 dose. 03/16/20   Trixie File, MD  Insulin  Aspart, w/Niacinamide, (FIASP ) 100 UNIT/ML SOLN INJECT UP TO 50 UNITS UNDER THE SKIN VIA INSULIN  PUMP DAILY 11/14/23   Trixie File, MD  Insulin  Disposable Pump (OMNIPOD 5 DEXG7G6 PODS GEN 5) MISC Use 1 pod every 3 days 05/24/23   Trixie File, MD  isosorbide  mononitrate (IMDUR ) 120 MG 24 hr tablet Take 1 tablet (120 mg total) by mouth daily. 04/09/24   Meng, Hao, PA  levothyroxine  (SYNTHROID ) 75 MCG tablet TAKE 1 TABLET(75 MCG) BY MOUTH DAILY 02/11/22   Trixie File, MD  MAGNESIUM  GLYCINATE PLUS PO Take 1 capsule by mouth daily.    [provider]  metoprolol  succinate (TOPROL -XL) 50 MG 24 hr tablet Take 1 tablet (50 mg total) by mouth daily. Take with or immediately following a meal. 04/22/24 07/21/24  Ladona Heinz, MD  Multiple Vitamins-Minerals (MULTIVITAMINS THER. W/MINERALS) TABS Take 1 tablet by mouth daily.    [provider]  nitroGLYCERIN  (NITROSTAT ) 0.4 MG SL tablet Place 1 tablet (0.4 mg total) under the tongue every 5 (five) minutes  as needed for chest pain. 11/14/23 11/13/24  Amin, Ankit C, MD  prasugrel  (EFFIENT ) 10 MG TABS tablet Take 1 tablet (10 mg total) by mouth daily. 12/05/23   Ladona Heinz, MD  promethazine  (PHENERGAN ) 25 MG tablet Take 25 mg by mouth every 6 (six) hours as needed for nausea.    [provider]  ranolazine  (RANEXA ) 500 MG 12 hr tablet Take 1 tablet (500 mg total) by mouth 2 (two) times daily. 03/20/24   Ladona Heinz, MD  rosuvastatin  (CRESTOR ) 20 MG tablet Take 1 tablet (20 mg total) by mouth daily. 03/20/24 06/18/24  Ladona Heinz, MD  SYRINGE/NEEDLE,  DISP, 1 ML 23G X 1 1 ML MISC Use every week 07/05/19   Trixie File, MD  tacrolimus  (PROGRAF ) 1 MG capsule Take 1-2 mg by mouth See admin instructions. Take 2 capsules by mouth in the morning and 1 capsule at night    [provider]  traMADol  (ULTRAM ) 50 MG tablet Take 50 mg by mouth every 6 (six) hours as needed for pain. 05/16/17   [provider]  TUBERCULIN SYR 1CC/25GX5/8 (B-D TB SYRINGE 1CC/25GX5/8) 25G X 5/8 1 ML MISC USE 1 EVERY 2 WEEKS 03/31/20   Trixie File, MD  VELTASSA  8.4 g packet Take 8.4 g by mouth every other day.    [provider]    Allergies: Vancomycin, Lipitor [atorvastatin ], Other, Codeine, and Metoclopramide hcl    Review of Systems  Gastrointestinal:  Positive for abdominal pain and vomiting.  All other systems reviewed and are negative.   Updated Vital Signs BP (!) 170/89 (BP Location: Right Arm)   Pulse 76   Temp 97.8 F (36.6 C) (Oral)   Resp 18   SpO2 100%   Physical Exam Vitals and nursing note reviewed.  Constitutional:      Comments: Uncomfortable and chronically ill  HENT:     Head: Normocephalic.     Nose: Nose normal.     Mouth/Throat:     Mouth: Mucous membranes are dry.  Eyes:     Extraocular Movements: Extraocular movements intact.     Pupils: Pupils are equal, round, and reactive to light.  Cardiovascular:     Rate and Rhythm: Normal rate and regular rhythm.     Pulses: Normal pulses.     Heart sounds: Normal heart sounds.  Pulmonary:     Effort: Pulmonary effort is normal.     Breath sounds: Normal breath sounds.  Abdominal:     Comments: Mild epigastric tenderness  Musculoskeletal:        General: Normal range of motion.     Cervical back: Normal range of motion and neck supple.  Skin:    General: Skin is warm.     Capillary Refill: Capillary refill takes less than 2 seconds.  Neurological:     General: No focal deficit present.  Psychiatric:        Mood and Affect: Mood normal.      (all labs ordered are listed, but only abnormal results are displayed) Labs Reviewed  CBC WITH DIFFERENTIAL/PLATELET - Abnormal; Notable for the following components:      Result Value   RBC 3.68 (*)    Hemoglobin 11.1 (*)    HCT 34.2 (*)    Platelets 504 (*)    All other components within normal limits  COMPREHENSIVE METABOLIC PANEL WITH GFR - Abnormal; Notable for the following components:   CO2 18 (*)    Glucose, Bld 177 (*)  BUN 51 (*)    Creatinine, Ser 2.71 (*)    Alkaline Phosphatase 127 (*)    GFR, Estimated 28 (*)    All other components within normal limits  LIPASE, BLOOD - Abnormal; Notable for the following components:   Lipase <10 (*)    All other components within normal limits  URINALYSIS, ROUTINE W REFLEX MICROSCOPIC - Abnormal; Notable for the following components:   Glucose, UA 50 (*)    Protein, ur >=300 (*)    All other components within normal limits  CULTURE, BLOOD (ROUTINE X 2)  CULTURE, BLOOD (ROUTINE X 2)  ETHANOL  I-STAT CG4 LACTIC ACID, ED  TROPONIN T, HIGH SENSITIVITY  TROPONIN T, HIGH SENSITIVITY    EKG: None  Radiology: No results found.   Procedures   Medications Ordered in the ED  morphine  (PF) 4 MG/ML injection 4 mg (has no administration in time range)  ondansetron  (ZOFRAN ) injection 4 mg (has no administration in time range)  cefTRIAXone  (ROCEPHIN ) 1 g in sodium chloride  0.9 % 100 mL IVPB (1 g Intravenous New Bag/Given 04/29/24 1738)  metroNIDAZOLE  (FLAGYL ) IVPB 500 mg (500 mg Intravenous New Bag/Given 04/29/24 1740)  sodium chloride  0.9 % bolus 1,000 mL (1,000 mLs Intravenous New Bag/Given 04/29/24 1736)                                    Medical Decision Making Daniel Hill is a 49 y.o. male here presenting with epigastric pain and nausea and low-grade temperature.  Patient has pancreatic and renal transplant already. Patient had a recent CT scan that showed pancreatitis with no obvious abscess.  Concerned that patient  may be septic from pancreatitis.  Plan to get CBC and CMP and lactate and cultures and give IV antibiotics and IV fluids and pain medicine and likely will need admission  5:53 PM White blood cell count is normal.  Lipase is negative.  However patient clinically has pancreatitis and CT confirmed pancreatitis.  Patient will be admitted to family practice for pancreatitis   Problems Addressed: Acute pancreatitis with infected necrosis, unspecified pancreatitis type: acute illness or injury  Amount and/or Complexity of Data Reviewed Labs: ordered. Decision-making details documented in ED Course.  Risk Prescription drug management.     Final diagnoses:  None    ED Discharge Orders     None          Patt Alm Macho, MD 04/29/24 1754  "

## 2024-04-29 NOTE — ED Triage Notes (Signed)
 Patient complains of being diagnosed with pancreatitis and having some increased pain with severe nausea. Alert and oriented

## 2024-04-29 NOTE — Telephone Encounter (Signed)
 Looks like Dr. Ladona also commented on the GI procedure in separate patient message as well.   Yes. It is a low risk procedure. How ever be aware, complication can occur rarely, and that risk has to be accepted by you as probably was discussed by your GI.   Again, I don't think it is prohibitory for Mr. Frampton to proceed given the low risk nature of the procedure, as long as he is aware that he would be a high risk candidate going through a low risk procedure.

## 2024-04-29 NOTE — ED Provider Triage Note (Signed)
 Emergency Medicine Provider Triage Evaluation Note  Daniel Hill , a 49 y.o. male  was evaluated in triage.  Pt complains of n/v x 15 episodes today acutely worse but has been vomiting x 6 weeks. Followed by [facility] with recent CT on 1/2 showing pancreatitis. Last vomited 3 hours ago, able to tolerate granola bar and fluids since. Told by nephrology to come in for IV abx.   Has attempted to use promethazine , meclizine, and zofran  without relief at home.   Hx of kidney transplant. On prasugrel  without any missed doses.   Subjective fevers (56F) at home, vertigo worse with head movement, epigatric pain, L sided flank pain. Intermittent chest pain.  Denies alcohol use, chronic NSAIDs.  Denies hematemesis, dysuria, hematuria.   Review of Systems  Positive: N/a Negative: N/a  Physical Exam  BP (!) 165/80   Pulse 83   Temp 98.4 F (36.9 C)   Resp 18   SpO2 98%  Gen:   Awake, no distress   Resp:  Normal effort  MSK:   Moves extremities without difficulty  Other:    Medical Decision Making  Medically screening exam initiated at 2:54 PM.  Appropriate orders placed.  BERRY GODSEY was informed that the remainder of the evaluation will be completed by another provider, this initial triage assessment does not replace that evaluation, and the importance of remaining in the ED until their evaluation is complete.     Beola Terrall [place], [place] 04/29/24 1500

## 2024-04-30 ENCOUNTER — Inpatient Hospital Stay (HOSPITAL_COMMUNITY)

## 2024-04-30 DIAGNOSIS — N1832 Chronic kidney disease, stage 3b: Secondary | ICD-10-CM | POA: Diagnosis not present

## 2024-04-30 DIAGNOSIS — Z940 Kidney transplant status: Secondary | ICD-10-CM | POA: Diagnosis not present

## 2024-04-30 DIAGNOSIS — K8500 Idiopathic acute pancreatitis without necrosis or infection: Secondary | ICD-10-CM | POA: Diagnosis not present

## 2024-04-30 DIAGNOSIS — E1022 Type 1 diabetes mellitus with diabetic chronic kidney disease: Secondary | ICD-10-CM | POA: Diagnosis not present

## 2024-04-30 DIAGNOSIS — R079 Chest pain, unspecified: Secondary | ICD-10-CM | POA: Diagnosis not present

## 2024-04-30 DIAGNOSIS — Z9483 Pancreas transplant status: Secondary | ICD-10-CM

## 2024-04-30 DIAGNOSIS — I2089 Other forms of angina pectoris: Secondary | ICD-10-CM | POA: Insufficient documentation

## 2024-04-30 DIAGNOSIS — N183 Chronic kidney disease, stage 3 unspecified: Secondary | ICD-10-CM

## 2024-04-30 DIAGNOSIS — D849 Immunodeficiency, unspecified: Secondary | ICD-10-CM

## 2024-04-30 LAB — COMPREHENSIVE METABOLIC PANEL WITH GFR
ALT: 13 U/L (ref 0–44)
ALT: 13 U/L (ref 0–44)
AST: 14 U/L — ABNORMAL LOW (ref 15–41)
AST: 15 U/L (ref 15–41)
Albumin: 3.2 g/dL — ABNORMAL LOW (ref 3.5–5.0)
Albumin: 3.3 g/dL — ABNORMAL LOW (ref 3.5–5.0)
Alkaline Phosphatase: 95 U/L (ref 38–126)
Alkaline Phosphatase: 103 U/L (ref 38–126)
Anion gap: 9 (ref 5–15)
Anion gap: 10 (ref 5–15)
BUN: 47 mg/dL — ABNORMAL HIGH (ref 6–20)
BUN: 46 mg/dL — ABNORMAL HIGH (ref 6–20)
CO2: 19 mmol/L — ABNORMAL LOW (ref 22–32)
CO2: 18 mmol/L — ABNORMAL LOW (ref 22–32)
Calcium: 9 mg/dL (ref 8.9–10.3)
Calcium: 9.2 mg/dL (ref 8.9–10.3)
Chloride: 115 mmol/L — ABNORMAL HIGH (ref 98–111)
Chloride: 116 mmol/L — ABNORMAL HIGH (ref 98–111)
Creatinine, Ser: 2.47 mg/dL — ABNORMAL HIGH (ref 0.61–1.24)
Creatinine, Ser: 2.4 mg/dL — ABNORMAL HIGH (ref 0.61–1.24)
GFR, Estimated: 31 mL/min — ABNORMAL LOW
GFR, Estimated: 32 mL/min — ABNORMAL LOW
Glucose, Bld: 81 mg/dL (ref 70–99)
Glucose, Bld: 115 mg/dL — ABNORMAL HIGH (ref 70–99)
Potassium: 4.8 mmol/L (ref 3.5–5.1)
Potassium: 5.4 mmol/L — ABNORMAL HIGH (ref 3.5–5.1)
Sodium: 142 mmol/L (ref 135–145)
Sodium: 143 mmol/L (ref 135–145)
Total Bilirubin: 0.2 mg/dL (ref 0.0–1.2)
Total Bilirubin: <0.2 mg/dL (ref 0.0–1.2)
Total Protein: 6 g/dL — ABNORMAL LOW (ref 6.5–8.1)
Total Protein: 6.3 g/dL — ABNORMAL LOW (ref 6.5–8.1)

## 2024-04-30 LAB — BASIC METABOLIC PANEL WITH GFR
Anion gap: 8 (ref 5–15)
BUN: 44 mg/dL — ABNORMAL HIGH (ref 6–20)
CO2: 19 mmol/L — ABNORMAL LOW (ref 22–32)
Calcium: 9.2 mg/dL (ref 8.9–10.3)
Chloride: 112 mmol/L — ABNORMAL HIGH (ref 98–111)
Creatinine, Ser: 2.46 mg/dL — ABNORMAL HIGH (ref 0.61–1.24)
GFR, Estimated: 31 mL/min — ABNORMAL LOW
Glucose, Bld: 157 mg/dL — ABNORMAL HIGH (ref 70–99)
Potassium: 4.8 mmol/L (ref 3.5–5.1)
Sodium: 139 mmol/L (ref 135–145)

## 2024-04-30 LAB — CBC
HCT: 29.1 % — ABNORMAL LOW (ref 39.0–52.0)
HCT: 30.2 % — ABNORMAL LOW (ref 39.0–52.0)
Hemoglobin: 9.5 g/dL — ABNORMAL LOW (ref 13.0–17.0)
Hemoglobin: 9.8 g/dL — ABNORMAL LOW (ref 13.0–17.0)
MCH: 30.2 pg (ref 26.0–34.0)
MCH: 30 pg (ref 26.0–34.0)
MCHC: 32.6 g/dL (ref 30.0–36.0)
MCHC: 32.5 g/dL (ref 30.0–36.0)
MCV: 92.4 fL (ref 80.0–100.0)
MCV: 92.4 fL (ref 80.0–100.0)
Platelets: 423 K/uL — ABNORMAL HIGH (ref 150–400)
Platelets: 422 K/uL — ABNORMAL HIGH (ref 150–400)
RBC: 3.15 MIL/uL — ABNORMAL LOW (ref 4.22–5.81)
RBC: 3.27 MIL/uL — ABNORMAL LOW (ref 4.22–5.81)
RDW: 13.4 % (ref 11.5–15.5)
RDW: 13.4 % (ref 11.5–15.5)
WBC: 6.6 K/uL (ref 4.0–10.5)
WBC: 5.8 K/uL (ref 4.0–10.5)
nRBC: 0 % (ref 0.0–0.2)
nRBC: 0 % (ref 0.0–0.2)

## 2024-04-30 LAB — ECHOCARDIOGRAM COMPLETE
Area-P 1/2: 5.27 cm2
Est EF: >75
S' Lateral: 3.2 cm
Single Plane A4C EF: 71.7 %
Weight: 3248 [oz_av]

## 2024-04-30 LAB — GLUCOSE, CAPILLARY
Glucose-Capillary: 105 mg/dL — ABNORMAL HIGH (ref 70–99)
Glucose-Capillary: 152 mg/dL — ABNORMAL HIGH (ref 70–99)
Glucose-Capillary: 158 mg/dL — ABNORMAL HIGH (ref 70–99)

## 2024-04-30 LAB — MRSA NEXT GEN BY PCR, NASAL: MRSA by PCR Next Gen: NOT DETECTED

## 2024-04-30 MED ORDER — POLYETHYLENE GLYCOL 3350 17 G PO PACK
17.0000 g | PACK | Freq: Every day | ORAL | Status: DC
  Administered 2024-04-30: 17 g via ORAL
  Filled 2024-04-30: qty 1

## 2024-04-30 MED ORDER — INSULIN PUMP
SUBCUTANEOUS | Status: DC
  Administered 2024-05-02: 1.4 via SUBCUTANEOUS
  Administered 2024-05-03: 2.9 via SUBCUTANEOUS
  Filled 2024-04-30: qty 1

## 2024-04-30 MED ORDER — LIDOCAINE 5 % EX PTCH
1.0000 | MEDICATED_PATCH | CUTANEOUS | Status: DC
  Administered 2024-05-02: 1 via TRANSDERMAL
  Filled 2024-04-30 (×2): qty 1

## 2024-04-30 MED ORDER — PANTOPRAZOLE SODIUM 40 MG IV SOLR
40.0000 mg | Freq: Two times a day (BID) | INTRAVENOUS | Status: DC
  Filled 2024-04-30: qty 10

## 2024-04-30 MED ORDER — LACTATED RINGERS IV SOLN: INTRAVENOUS | Status: DC

## 2024-04-30 MED ORDER — PERFLUTREN LIPID MICROSPHERE
1.0000 mL | INTRAVENOUS | Status: AC | PRN
  Administered 2024-04-30: 3 mL via INTRAVENOUS

## 2024-04-30 MED ORDER — CAPSAICIN 0.075 % EX CREA
TOPICAL_CREAM | Freq: Two times a day (BID) | CUTANEOUS | Status: DC
  Filled 2024-04-30: qty 57

## 2024-04-30 MED ORDER — NON FORMULARY: 40.0000 mg | Freq: Every day | Status: DC

## 2024-04-30 MED ORDER — SODIUM CHLORIDE 0.9 % IV SOLN: INTRAVENOUS | Status: AC

## 2024-04-30 MED ORDER — ESOMEPRAZOLE MAGNESIUM 20 MG PO CPDR
40.0000 mg | DELAYED_RELEASE_CAPSULE | Freq: Every day | ORAL | Status: DC
  Administered 2024-04-30 – 2024-05-02 (×3): 40 mg via ORAL
  Filled 2024-04-30 (×6): qty 2

## 2024-04-30 MED ORDER — ESOMEPRAZOLE MAGNESIUM 40 MG PO CPDR
40.0000 mg | DELAYED_RELEASE_CAPSULE | Freq: Every day | ORAL | Status: DC
  Administered 2024-04-30: 40 mg via ORAL
  Filled 2024-04-30: qty 2

## 2024-04-30 MED ORDER — AMLODIPINE BESYLATE 10 MG PO TABS
10.0000 mg | ORAL_TABLET | Freq: Every day | ORAL | Status: DC
  Administered 2024-04-30 – 2024-05-03 (×4): 10 mg via ORAL
  Filled 2024-04-30: qty 1
  Filled 2024-04-30: qty 2
  Filled 2024-04-30 (×2): qty 1

## 2024-04-30 NOTE — Evaluation (Signed)
 Physical Therapy Evaluation Patient Details Name: Daniel Hill MRN: [id number] DOB: Sep 02, 1975 Today's Date: 04/30/2024  History of Present Illness  49 yo M admitted 04/29/24 with abdominal pain and vomiting, pt workup for pancreatitis and pyelonephritis PMH  pyelonephritis/infected renal transplant, ESRD status post renal transplant now CKD, status post failed 3 pancreatic transplants, diabetes type 1, hypothyroidism, GERD, CAD, anxiety, depression, OSA, obesity, glaucoma.  Clinical Impression  Patient presents with mobility close to functional baseline.  He was enrolled in cardiac rehab though difficulty with attendance due to issues with pancreatitis/pyelonephritis.  Feel he is stable for home without PT follow up, though would recommend trying to complete cardiac rehab when medically cleared.  PT will sign off.       If plan is discharge home, recommend the following:     Can travel by private vehicle        Equipment Recommendations None recommended by PT  Recommendations for Other Services       Functional Status Assessment Patient has not had a recent decline in their functional status     Precautions / Restrictions Precautions Precautions: None      Mobility  Bed Mobility Overal bed mobility: Independent                  Transfers Overall transfer level: Independent                      Ambulation/Gait Ambulation/Gait assistance: Independent Gait Distance (Feet): 350 Feet Assistive device: None Gait Pattern/deviations: WFL(Within Functional Limits)          Stairs Stairs: Yes Stairs assistance: Modified independent (Device/Increase time) Stair Management: Alternating pattern, Forwards, One rail Left Number of Stairs: 3    Wheelchair Mobility     Tilt Bed    Modified Rankin (Stroke Patients Only)       Balance                                 Standardized Balance Assessment Standardized Balance Assessment :  Dynamic Gait Index   Dynamic Gait Index Level Surface: Normal Change in Gait Speed: Normal Gait with Horizontal Head Turns: Mild Impairment Gait with Vertical Head Turns: Normal Gait and Pivot Turn: Normal Step Over Obstacle: Normal Step Around Obstacles: Normal Steps: Mild Impairment Total Score: 22       Pertinent Vitals/Pain Pain Assessment Pain Assessment: Faces Faces Pain Scale: Hurts a little bit Pain Location: abdominal pain mild to moderate Pain Descriptors / Indicators: Discomfort Pain Intervention(s): Monitored during session    Home Living Family/patient expects to be discharged to:: Private residence Living Arrangements: Spouse/significant other Available Help at Discharge: Family Type of Home: House Home Access: Stairs to enter Entrance Stairs-Rails: None Entrance Stairs-Number of Steps: 3   Home Layout: Two level;Able to live on main level with bedroom/bathroom (upstairs unfinished attic) Home Equipment: None      Prior Function Prior Level of Function : Independent/Modified Independent;Working/employed             Mobility Comments: was trying to attend cardiac rehab though not able to attend much due to current issue ADLs Comments: 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[employer]     Extremity/Trunk Assessment   Upper Extremity Assessment Upper Extremity Assessment: Overall [facility] for tasks assessed    Lower Extremity Assessment Lower Extremity Assessment: Overall WFL for tasks assessed    Cervical / Trunk Assessment Cervical / Trunk Assessment:  Normal  Communication   Communication Communication: No apparent difficulties    Cognition Arousal: Alert Behavior During Therapy: WFL for tasks assessed/performed   PT - Cognitive impairments: No apparent impairments                         Following commands: Intact       Cueing Cueing Techniques: Verbal cues     General Comments General comments (skin integrity, edema, etc.): patient reported  mild dyspnea with ambulation though HR 90's, SpO2 94% on RA; discussed possible causes including not yet recovered from heart attack (had started cardiac rehab but not able to go much due to issues with kidneys and pancreas) and could come from his infection vs inflammation issues    Exercises     Assessment/Plan    PT Assessment Patient does not need any further PT services  PT Problem List         PT Treatment Interventions      PT Goals (Current goals can be found in the Care Plan section)  Acute Rehab PT Goals PT Goal Formulation: All assessment and education complete, DC therapy    Frequency       Co-evaluation               AM-PAC PT 6 Clicks Mobility  Outcome Measure Help needed turning from your back to your side while in a flat bed without using bedrails?: None Help needed moving from lying on your back to sitting on the side of a flat bed without using bedrails?: None Help needed moving to and from a bed to a chair (including a wheelchair)?: None Help needed standing up from a chair using your arms (e.g., wheelchair or bedside chair)?: None Help needed to walk in hospital room?: None Help needed climbing 3-5 steps with a railing? : None 6 Click Score: 24    End of Session Equipment Utilized During Treatment: Gait belt Activity Tolerance: Patient tolerated treatment well Patient left: in bed;with call bell/phone within reach;with family/visitor present   PT Visit Diagnosis: Muscle weakness (generalized) (M62.81)    Time: 8457-8442 PT Time Calculation (min) (ACUTE ONLY): 15 min   Charges:   PT Evaluation $PT Eval Low Complexity: 1 Low   PT General Charges $$ ACUTE PT VISIT: 1 Visit         Micheline Portal, PT Acute Rehabilitation Services Office:[phone] 04/30/2024   Montie Portal 04/30/2024, 5:06 PM

## 2024-04-30 NOTE — Consult Note (Signed)
 "  Daniel Hill 1975-07-21  [id number].     HPI:  Daniel Hill is a 49 yo male with a history of T1DM (s/p kidney-pancreas transplant in 2003, CAD, and gastroparesis who was admitted with nausea and vomiting. He reports he has been having frequent vomiting for several months now. It is sometimes associated with eating, but no always. He often has vomiting for several days even if not eating. He reports occasional epigastric pain, but his primary symptom is vomiting. He had a CT scan several days ago showing edema around the native pancreas, concerning for possible pancreatitis. His lipase was normal. Today he had a RUQ US  that did not show gallstones but there was trace pericholecystic fluid. Surgery was consulted to evaluate for cholecystitis. His WBC and LFTs are normal.   Aside from his transplant, his other previous abdominal surgeries include a bilateral nephrectomy and ventral hernia repair with mesh.  He has CAD and had an NSTEMI in July 2025. He is managed medically and is on Effient .   ROS: Review of Systems  Constitutional:  Positive for malaise/fatigue.  Cardiovascular:  Negative for chest pain.  Gastrointestinal:  Positive for nausea and vomiting.    Family History  Problem Relation Age of Onset   Stroke Mother    Lymphoma Father    Diabetes type II Father    Heart attack Brother    Thyroid  cancer Brother     Past Medical History:  Diagnosis Date   Anxiety    Depression    Diabetes mellitus    GERD (gastroesophageal reflux disease)    Hypothyroidism    Kidney replaced by transplant    Pancreas transplanted [facility])    Thyroid  disease     Past Surgical History:  Procedure Laterality Date   CORONARY ANGIOGRAPHY N/A 11/13/2023   Procedure: CORONARY ANGIOGRAPHY;  Surgeon: Jordan, Peter M, MD;  Location: [facility] INVASIVE CV LAB;  Service: Cardiovascular;  Laterality: N/A;   EYE SURGERY      Social History:  reports that he has never smoked. He has never used smokeless  tobacco. He reports current alcohol use. He reports that he does not use drugs.  Allergies: Allergies[1]  Medications Prior to Admission  Medication Sig Dispense Refill   amLODipine  (NORVASC ) 10 MG tablet Take 1 tablet (10 mg total) by mouth daily. 90 tablet 1   amoxicillin-clavulanate (AUGMENTIN) 500-125 MG tablet Take 1 tablet by mouth 2 (two) times daily.     esomeprazole  (NEXIUM ) 40 MG capsule Take 40 mg by mouth 2 (two) times daily.     ezetimibe  (ZETIA ) 10 MG tablet Take 10 mg by mouth daily.     furosemide  (LASIX ) 40 MG tablet Take 40 mg by mouth daily.     Insulin  Aspart, w/Niacinamide, (FIASP ) 100 UNIT/ML SOLN INJECT UP TO 50 UNITS UNDER THE SKIN VIA INSULIN  PUMP DAILY 40 mL 1   isosorbide  mononitrate (IMDUR ) 120 MG 24 hr tablet Take 1 tablet (120 mg total) by mouth daily. 90 tablet 3   levothyroxine  (SYNTHROID ) 75 MCG tablet TAKE 1 TABLET(75 MCG) BY MOUTH DAILY 90 tablet 3   metoprolol  succinate (TOPROL -XL) 50 MG 24 hr tablet Take 1 tablet (50 mg total) by mouth daily. Take with or immediately following a meal.     nitroGLYCERIN  (NITROSTAT ) 0.4 MG SL tablet Place 1 tablet (0.4 mg total) under the tongue every 5 (five) minutes as needed for chest pain. 25 tablet 3   ondansetron  (ZOFRAN ) 8 MG tablet Take 8 mg by  mouth every 8 (eight) hours as needed.     prasugrel  (EFFIENT ) 10 MG TABS tablet Take 1 tablet (10 mg total) by mouth daily. 90 tablet 2   promethazine  (PHENERGAN ) 25 MG tablet Take 25 mg by mouth every 6 (six) hours as needed for nausea.     scopolamine  (TRANSDERM-SCOP) 1 MG/3DAYS Place 1 patch onto the skin every 3 (three) days.     tacrolimus  (PROGRAF ) 1 MG capsule Take 1-2 mg by mouth See admin instructions. Take 2 capsules by mouth in the morning and 1 capsule at night     traMADol  (ULTRAM ) 50 MG tablet Take 50 mg by mouth every 6 (six) hours as needed for pain.     VELTASSA  8.4 g packet Take 8.4 g by mouth every other day.     fluticasone  (FLONASE ) 50 MCG/ACT nasal spray  Place 1 spray into both nostrils daily as needed for allergies. (Patient not taking: Reported on 04/29/2024)     furosemide  (LASIX ) 20 MG tablet Take 20 mg by mouth daily as needed for edema (for legs). (Patient not taking: Reported on 04/09/2024)     Glucagon  (BAQSIMI  TWO PACK) 3 MG/DOSE POWD Place 3 mg into the nose once as needed for up to 1 dose. (Patient not taking: Reported on 04/29/2024) 1 each 11   MAGNESIUM  GLYCINATE PLUS PO Take 1 capsule by mouth daily. (Patient not taking: Reported on 04/29/2024)     Multiple Vitamins-Minerals (MULTIVITAMINS THER. W/MINERALS) TABS Take 1 tablet by mouth daily. (Patient not taking: Reported on 04/29/2024)     ranolazine  (RANEXA ) 500 MG 12 hr tablet Take 1 tablet (500 mg total) by mouth 2 (two) times daily. (Patient not taking: Reported on 04/29/2024) 60 tablet 3   rosuvastatin  (CRESTOR ) 20 MG tablet Take 1 tablet (20 mg total) by mouth daily. (Patient not taking: Reported on 04/29/2024)       Physical Exam: Blood pressure (!) 160/89, pulse 84, temperature 98.7 F (37.1 C), temperature source Oral, resp. rate 16, height 5' 9 (1.753 m), weight 84.7 kg, SpO2 100%. General: resting comfortably, appears stated age, no apparent distress Neurological: alert and oriented, no focal deficits HEENT: normocephalic, atraumatic, no scleral icterus Respiratory: normal work of breathing on room air Abdomen: soft, nondistended, very minimal RUQ tenderness to deep palpation. Well-healed midline surgical scar. Extremities: warm and well-perfused, no deformities, moving all extremities spontaneously Psychiatric: normal mood and affect Skin: warm and dry, no jaundice, no rashes or lesions   Results for orders placed or performed during the hospital encounter of 04/29/24 (from the past 48 hours)  CBC with Differential     Status: Abnormal   Collection Time: 04/29/24  3:22 PM  Result Value Ref Range   WBC 9.8 4.0 - 10.5 K/uL   RBC 3.68 (L) 4.22 - 5.81 MIL/uL   Hemoglobin 11.1  (L) 13.0 - 17.0 g/dL   HCT 65.7 (L) 60.9 - 47.9 %   MCV 92.9 80.0 - 100.0 fL   MCH 30.2 26.0 - 34.0 pg   MCHC 32.5 30.0 - 36.0 g/dL   RDW 86.5 88.4 - 84.4 %   Platelets 504 (H) 150 - 400 K/uL   nRBC 0.0 0.0 - 0.2 %   Neutrophils Relative % 68 %   Neutro Abs 6.6 1.7 - 7.7 K/uL   Lymphocytes Relative 22 %   Lymphs Abs 2.2 0.7 - 4.0 K/uL   Monocytes Relative 7 %   Monocytes Absolute 0.7 0.1 - 1.0 K/uL   Eosinophils Relative 2 %  Eosinophils Absolute 0.2 0.0 - 0.5 K/uL   Basophils Relative 1 %   Basophils Absolute 0.1 0.0 - 0.1 K/uL   Immature Granulocytes 0 %   Abs Immature Granulocytes 0.02 0.00 - 0.07 K/uL    Comment: Performed at [facility] Lab, [address] [place], [place] [address]  Comprehensive metabolic panel     Status: Abnormal   Collection Time: 04/29/24  3:22 PM  Result Value Ref Range   Sodium 138 135 - 145 mmol/L   Potassium 4.8 3.5 - 5.1 mmol/L   Chloride 108 98 - 111 mmol/L   CO2 18 (L) 22 - 32 mmol/L   Glucose, Bld 177 (H) 70 - 99 mg/dL    Comment: Glucose reference range applies only to samples taken after fasting for at least 8 hours.   BUN 51 (H) 6 - 20 mg/dL   Creatinine, Ser 7.28 (H) 0.61 - 1.24 mg/dL   Calcium  9.9 8.9 - 10.3 mg/dL   Total Protein 7.8 6.5 - 8.1 g/dL   Albumin 4.0 3.5 - 5.0 g/dL   AST 17 15 - 41 U/L   ALT 17 0 - 44 U/L   Alkaline Phosphatase 127 (H) 38 - 126 U/L   Total Bilirubin 0.3 0.0 - 1.2 mg/dL   GFR, Estimated 28 (L) >60 mL/min    Comment: (NOTE) Calculated using the CKD-EPI Creatinine Equation (2021)    Anion gap 12 5 - 15    Comment: Performed at [facility], [address] [place], [place] [address]  Lipase, blood     Status: Abnormal   Collection Time: 04/29/24  3:22 PM  Result Value Ref Range   Lipase <10 (L) 11 - 51 U/L    Comment: Performed at [facility], [address] [place], [place] [address]  Troponin T, High Sensitivity     Status: None   Collection Time: 04/29/24  3:22 PM  Result Value  Ref Range   Troponin T High Sensitivity 18 0 - 19 ng/L    Comment: (NOTE) Biotin concentrations > 1000 ng/mL falsely decrease TnT results.  Serial cardiac troponin measurements are suggested.  Refer to the Links section for chest pain algorithms and additional  guidance. Performed at [facility], [address], [place], [place] [address]   Urinalysis, Routine w reflex microscopic -Urine, Clean Catch     Status: Abnormal   Collection Time: 04/29/24  3:40 PM  Result Value Ref Range   Color, Urine YELLOW YELLOW   APPearance CLEAR CLEAR   Specific Gravity, Urine 1.009 1.005 - 1.030   pH 5.0 5.0 - 8.0   Glucose, UA 50 (A) NEGATIVE mg/dL   Hgb urine dipstick NEGATIVE NEGATIVE   Bilirubin Urine NEGATIVE NEGATIVE   Ketones, ur NEGATIVE NEGATIVE mg/dL   Protein, ur >=699 (A) NEGATIVE mg/dL   Nitrite NEGATIVE NEGATIVE   Leukocytes,Ua NEGATIVE NEGATIVE   RBC / HPF 0-5 0 - 5 RBC/hpf   WBC, UA 0-5 0 - 5 WBC/hpf   Bacteria, UA NONE SEEN NONE SEEN   Squamous Epithelial / HPF 0-5 0 - 5 /HPF   Mucus PRESENT    Hyaline Casts, UA PRESENT    Granular Casts, UA PRESENT     Comment: Performed at [facility], [address], [place], [place] [address]  Ethanol     Status: None   Collection Time: 04/29/24  4:57 PM  Result Value Ref Range   Alcohol, Ethyl (B) <15 <  15 mg/dL    Comment: (NOTE) For medical purposes only. Performed at [facility], [address] [place], [place] [address]   Troponin T, High Sensitivity     Status: None   Collection Time: 04/29/24  4:57 PM  Result Value Ref Range   Troponin T High Sensitivity 17 0 - 19 ng/L    Comment: (NOTE) Biotin concentrations > 1000 ng/mL falsely decrease TnT results.  Serial cardiac troponin measurements are suggested.  Refer to the Links section for chest pain algorithms and additional  guidance. Performed at [facility], [address], [place], [place] [address]   Blood culture (routine x 2)     Status:  None (Preliminary result)   Collection Time: 04/29/24  5:41 PM   Specimen: BLOOD  Result Value Ref Range   Specimen Description BLOOD RIGHT ANTECUBITAL    Special Requests      BOTTLES DRAWN AEROBIC AND ANAEROBIC Blood Culture results may not be optimal due to an inadequate volume of blood received in culture bottles   Culture      NO GROWTH < 24 HOURS Performed at [facility], [address], [place], [place] [address]    Report Status PENDING   Blood culture (routine x 2)     Status: None (Preliminary result)   Collection Time: 04/29/24  5:41 PM   Specimen: BLOOD  Result Value Ref Range   Specimen Description BLOOD RIGHT ANTECUBITAL    Special Requests      BOTTLES DRAWN AEROBIC AND ANAEROBIC Blood Culture adequate volume   Culture      NO GROWTH < 24 HOURS Performed at [facility], [address] [place], [place] [address]    Report Status PENDING   I-Stat CG4 Lactic Acid     Status: None   Collection Time: 04/29/24  5:53 PM  Result Value Ref Range   Lactic Acid, Venous 0.7 0.5 - 1.9 mmol/L  Urinalysis, w/ Reflex to Culture (Infection Suspected) -Urine, Clean Catch     Status: Abnormal   Collection Time: 04/29/24  7:53 PM  Result Value Ref Range   Specimen Source URINE, CLEAN CATCH    Color, Urine YELLOW YELLOW   APPearance CLEAR CLEAR   Specific Gravity, Urine 1.010 1.005 - 1.030   pH 5.0 5.0 - 8.0   Glucose, UA NEGATIVE NEGATIVE mg/dL   Hgb urine dipstick NEGATIVE NEGATIVE   Bilirubin Urine NEGATIVE NEGATIVE   Ketones, ur NEGATIVE NEGATIVE mg/dL   Protein, ur >=699 (A) NEGATIVE mg/dL   Nitrite NEGATIVE NEGATIVE   Leukocytes,Ua NEGATIVE NEGATIVE   RBC / HPF 0-5 0 - 5 RBC/hpf   WBC, UA 0-5 0 - 5 WBC/hpf    Comment:        Reflex urine culture not performed if WBC <=10, OR if Squamous epithelial cells >5. If Squamous epithelial cells >5 suggest recollection.    Bacteria, UA RARE (A) NONE SEEN   Squamous Epithelial / HPF 0-5 0 - 5 /HPF   Mucus  PRESENT    Hyaline Casts, UA PRESENT     Comment: Performed at [facility], [address], [place], [place] [address]  Comprehensive metabolic panel     Status: Abnormal   Collection Time: 04/30/24  5:09 AM  Result Value Ref Range   Sodium 142 135 - 145 mmol/L   Potassium 4.8 3.5 - 5.1 mmol/L   Chloride 115 (H) 98 - 111 mmol/L   CO2  19 (L) 22 - 32 mmol/L   Glucose, Bld 81 70 - 99 mg/dL    Comment: Glucose reference range applies only to samples taken after fasting for at least 8 hours.   BUN 47 (H) 6 - 20 mg/dL   Creatinine, Ser 7.52 (H) 0.61 - 1.24 mg/dL   Calcium  9.0 8.9 - 10.3 mg/dL   Total Protein 6.0 (L) 6.5 - 8.1 g/dL   Albumin 3.2 (L) 3.5 - 5.0 g/dL   AST 14 (L) 15 - 41 U/L   ALT 13 0 - 44 U/L   Alkaline Phosphatase 95 38 - 126 U/L   Total Bilirubin 0.2 0.0 - 1.2 mg/dL   GFR, Estimated 31 (L) >60 mL/min    Comment: (NOTE) Calculated using the CKD-EPI Creatinine Equation (2021)    Anion gap 9 5 - 15    Comment: Performed at [facility], [address], [place], [place] [address]  CBC     Status: Abnormal   Collection Time: 04/30/24  5:09 AM  Result Value Ref Range   WBC 6.6 4.0 - 10.5 K/uL   RBC 3.15 (L) 4.22 - 5.81 MIL/uL   Hemoglobin 9.5 (L) 13.0 - 17.0 g/dL   HCT 70.8 (L) 60.9 - 47.9 %   MCV 92.4 80.0 - 100.0 fL   MCH 30.2 26.0 - 34.0 pg   MCHC 32.6 30.0 - 36.0 g/dL   RDW 86.5 88.4 - 84.4 %   Platelets 423 (H) 150 - 400 K/uL   nRBC 0.0 0.0 - 0.2 %    Comment: Performed at [facility] Lab, [address], [place], [place] [address]  Comprehensive metabolic panel     Status: Abnormal   Collection Time: 04/30/24 12:36 PM  Result Value Ref Range   Sodium 143 135 - 145 mmol/L   Potassium 5.4 (H) 3.5 - 5.1 mmol/L   Chloride 116 (H) 98 - 111 mmol/L   CO2 18 (L) 22 - 32 mmol/L   Glucose, Bld 115 (H) 70 - 99 mg/dL    Comment: Glucose reference range applies only to samples taken after fasting for at least 8 hours.   BUN 46 (H) 6 - 20 mg/dL    Creatinine, Ser 7.59 (H) 0.61 - 1.24 mg/dL   Calcium  9.2 8.9 - 10.3 mg/dL   Total Protein 6.3 (L) 6.5 - 8.1 g/dL   Albumin 3.3 (L) 3.5 - 5.0 g/dL   AST 15 15 - 41 U/L   ALT 13 0 - 44 U/L   Alkaline Phosphatase 103 38 - 126 U/L   Total Bilirubin <0.2 0.0 - 1.2 mg/dL   GFR, Estimated 32 (L) >60 mL/min    Comment: (NOTE) Calculated using the CKD-EPI Creatinine Equation (2021)    Anion gap 10 5 - 15    Comment: Performed at [facility], [address], [place], [place] [address]  CBC     Status: Abnormal   Collection Time: 04/30/24 12:36 PM  Result Value Ref Range   WBC 5.8 4.0 - 10.5 K/uL   RBC 3.27 (L) 4.22 - 5.81 MIL/uL   Hemoglobin 9.8 (L) 13.0 - 17.0 g/dL   HCT 69.7 (L) 60.9 - 47.9 %   MCV 92.4 80.0 - 100.0 fL   MCH 30.0 26.0 - 34.0 pg   MCHC 32.5 30.0 - 36.0 g/dL   RDW 86.5 88.4 - 84.4 %   Platelets 422 (H) 150 - 400 K/uL   nRBC 0.0 0.0 - 0.2 %  Comment: Performed at [facility], [address] [place], [place] [address]  MRSA Next Gen by PCR, Nasal     Status: None   Collection Time: 04/30/24  3:30 PM   Specimen: Nasal Mucosa; Nasal Swab  Result Value Ref Range   MRSA by PCR Next Gen NOT DETECTED NOT DETECTED    Comment: (NOTE) The GeneXpert MRSA Assay (FDA approved for NASAL specimens only), is one component of a comprehensive MRSA colonization surveillance program. It is not intended to diagnose MRSA infection nor to guide or monitor treatment for MRSA infections. Test performance is not FDA approved in patients less than 76 years old. Performed at [facility], [address] [place], [place] [address]   Glucose, capillary     Status: Abnormal   Collection Time: 04/30/24  4:25 PM  Result Value Ref Range   Glucose-Capillary 158 (H) 70 - 99 mg/dL    Comment: Glucose reference range applies only to samples taken after fasting for at least 8 hours.   ECHOCARDIOGRAM COMPLETE Result Date: 04/30/2024    ECHOCARDIOGRAM REPORT   Patient Name:    Daniel Hill Date of Exam: 04/30/2024 Medical Rec #:  [id number]       Height:       69.0 in Accession #:    [id number]      Weight:       203.0 lb Date of Birth:  04/11/1976        BSA:          2.079 m Patient Age:    48 years        BP:           160/88 mmHg Patient Gender: M               HR:           83 bpm. Exam Location:  Inpatient Procedure: 2D Echo, Cardiac Doppler, Color Doppler and Intracardiac            Opacification Agent (Both Spectral and Color Flow Doppler were            utilized during procedure). Indications:    Chest Pain  History:        Patient has prior history of Echocardiogram examinations, most                 recent 11/11/2023. Angina, Previous Myocardial Infarction and                 CAD, Signs/Symptoms:Chest Pain and Edema; Risk Factors:Diabetes.  Sonographer:    Juliene Rucks Referring Phys: ARETA SALIVA  Sonographer Comments: Patient is obese. IMPRESSIONS  1. Left ventricular ejection fraction, by estimation, is >75%. The left ventricle has hyperdynamic function. The left ventricle has no regional wall motion abnormalities. Left ventricular diastolic parameters were normal.  2. Right ventricular systolic function is hyperdynamic. The right ventricular size is normal.  3. A small pericardial effusion is present. The pericardial effusion is posterior to the left ventricle.  4. The mitral valve is normal in structure. No evidence of mitral valve regurgitation. No evidence of mitral stenosis.  5. The aortic valve is tricuspid. Aortic valve regurgitation is not visualized. No aortic stenosis is present.  6. The inferior vena cava is normal in size with greater than 50% respiratory variability, suggesting right atrial pressure of 3 mmHg. Comparison(s): Prior images reviewed side by side. Function is hyperdynamic. FINDINGS  Left Ventricle: Left ventricular ejection fraction, by  estimation, is >75%. The left ventricle has hyperdynamic function. The left ventricle has no regional wall motion  abnormalities. Definity  contrast agent was given IV to delineate the left ventricular endocardial borders. The left ventricular internal cavity size was normal in size. There is no left ventricular hypertrophy. Left ventricular diastolic parameters were normal. Right Ventricle: The right ventricular size is normal. No increase in right ventricular wall thickness. Right ventricular systolic function is hyperdynamic. Left Atrium: Left atrial size was normal in size. Right Atrium: Right atrial size was normal in size. Pericardium: A small pericardial effusion is present. The pericardial effusion is posterior to the left ventricle. Mitral Valve: The mitral valve is normal in structure. No evidence of mitral valve regurgitation. No evidence of mitral valve stenosis. Tricuspid Valve: The tricuspid valve is normal in structure. Tricuspid valve regurgitation is not demonstrated. No evidence of tricuspid stenosis. Aortic Valve: The aortic valve is tricuspid. Aortic valve regurgitation is not visualized. No aortic stenosis is present. Pulmonic Valve: The pulmonic valve was normal in structure. Pulmonic valve regurgitation is not visualized. No evidence of pulmonic stenosis. Aorta: The aortic root and ascending aorta are structurally normal, with no evidence of dilitation. Venous: The inferior vena cava is normal in size with greater than 50% respiratory variability, suggesting right atrial pressure of 3 mmHg. IAS/Shunts: No atrial level shunt detected by color flow Doppler.  LEFT VENTRICLE PLAX 2D LVIDd:         4.40 cm      Diastology LVIDs:         3.20 cm      LV e' medial:    11.50 cm/s LV PW:         1.00 cm      LV E/e' medial:  10.2 LV IVS:        1.20 cm      LV e' lateral:   10.40 cm/s LVOT diam:     1.70 cm      LV E/e' lateral: 11.2 LV SV:         94 LV SV Index:   45 LVOT Area:     2.27 cm  LV Volumes (MOD) LV vol d, MOD A4C: 129.0 ml LV vol s, MOD A4C: 36.5 ml LV SV MOD A4C:     129.0 ml RIGHT VENTRICLE RV Basal  diam:  3.40 cm RV Mid diam:    2.50 cm RV S prime:     19.50 cm/s TAPSE (M-mode): 2.2 cm LEFT ATRIUM           Index        RIGHT ATRIUM           Index LA diam:      4.20 cm 2.02 cm/m   RA Area:     15.70 cm LA Vol (A2C): 67.6 ml 32.51 ml/m  RA Volume:   35.50 ml  17.07 ml/m  AORTIC VALVE LVOT Vmax:   174.00 cm/s LVOT Vmean:  135.000 cm/s LVOT VTI:    0.416 m  AORTA Ao Root diam: 2.80 cm Ao Asc diam:  2.80 cm MITRAL VALVE MV Area (PHT): 5.27 cm     SHUNTS MV Decel Time: 144 msec     Systemic VTI:  0.42 m MV E velocity: 117.00 cm/s  Systemic Diam: 1.70 cm MV A velocity: 103.00 cm/s MV E/A ratio:  1.14 Stanly Leavens MD Electronically signed by Stanly Leavens MD Signature Date/Time: 04/30/2024/5:05:29 PM    Final    US  Abdomen Limited  RUQ (LIVER/GB) Result Date: 04/30/2024 EXAM: Right Upper Quadrant Abdominal Ultrasound 04/30/2024 11:51:53 AM TECHNIQUE: Real-time ultrasonography of the right upper quadrant of the abdomen was performed. COMPARISON: US  Abdomen 11/04/2022, US  Abdomen 10/20/2016, Non-contrast CT abdomen and pelvis 04/26/2024. CLINICAL HISTORY: 49 year old male. Abdominal pain. Kidney transplant. FINDINGS: LIVER: Normal echogenicity. No intrahepatic biliary ductal dilatation. No evidence of mass. Hepatopetal flow in the portal vein. BILIARY SYSTEM: Gallbladder wall thickness measures 3.0 mm, borderline. Trace pericholecystic fluid (series 1 image 23) and positive sonographic Murphy sign elicited. No echogenic stones or sludge identified within the gallbladder lumen. The common bile duct measures 4 mm, normal. RIGHT KIDNEY: Right kidney appears to be surgically absent. OTHER: No right upper quadrant ascites. IMPRESSION: 1. Trace pericholecystic fluid with borderline gallbladder wall thickening and a positive sonographic murphy sign reported. No gallstone identified. Differential considerations include acalculous cholecystitis versus edema state/anasarca. Electronically signed by: Helayne Hurst MD 04/30/2024 12:17 PM EST RP Workstation: [id number]      Assessment/Plan 49 yo male with an extensive medical history including T1DM, CKD, history of kidney-pancreas transplant, and CAD, presenting with nausea and vomiting. I personally reviewed his labs, imaging and notes. RUQ US  shows no gallstones and a very small amount of pericholecystic fluid. There is also mild peripancreatic edema, although he does not have significant abdominal pain. His primary symptom is vomiting and not pain, which is not very typical of acute cholecystitis or biliary colic, although he does have very mild RUQ tenderness on exam. Agree with HIDA scan to further evaluate for cholecystitis. If this is negative, patient should be evaluated for other causes of pain. He has a documented history of gastroparesis, although he apparently recently had a normal gastric emptying study, so it is not clear if this is contributing to his symptoms. GI has also been consulted and are following.   If HIDA is consistent with cholecystitis, patient would be at high risk for periop complications given his medical comorbidities, which I discussed with him today. Final surgical plan pending results of HIDA.   Leonor Dawn, MD [facility] Surgery General, Hepatobiliary and Pancreatic Surgery 04/30/2024 6:56 PM       [1]  Allergies Allergen Reactions   Vancomycin Shortness Of Breath   Lipitor [Atorvastatin ] Other (See Comments)   Other Nausea And Vomiting    RAW Canteloupe, banana, tomatoes, all raw vegetables  Unless the vegetables have dressing on them.  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   Codeine Nausea And Vomiting   Metoclopramide Hcl Nausea And Vomiting   "

## 2024-04-30 NOTE — ED Notes (Signed)
 Per pharmacy receiving RN called.

## 2024-04-30 NOTE — Progress Notes (Signed)
 Echocardiogram 2D Echocardiogram has been performed.  Daniel Hill 04/30/2024, 2:57 PM

## 2024-04-30 NOTE — Progress Notes (Signed)
 Brought patient Protonix . He refuses to take. Patient verbalizes frustration about people not listening to him. He states he had gone over this with pharmacy, had counted pills of Nexium  and signed paper to release to pharmacy for  labeling to dispense to him. He says in the past when he has switched, it has caused him a severe reflux reaction that lasts for 5 to 7 days.    Teaching service paged. They will discontinue Protonix  and reinstate Nexium .

## 2024-04-30 NOTE — ED Notes (Signed)
 Pt is

## 2024-04-30 NOTE — Inpatient Diabetes Management (Addendum)
 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 KYM Fendt, last visit per pt approx 6 months ago (last office visit almost approx 1 year ago on 05/24/2023) A1c usually controlled per pt and wife A1c 6.4% on 7/19  Spoke with pt and wife at bedside. Pt last changed pod yesterday. Wife reports she will bring extra pump supplies tonight. They did bring pt Fiasp  insulin . Will follow pt while here. Pod location left abdomen Dexcom G6 location posterior upper left arm  Thanks, Clotilda Bull RN, MSN, BC-ADM Inpatient Diabetes Coordinator Team Pager [phone] (8a-5p)

## 2024-04-30 NOTE — Progress Notes (Addendum)
 "    Daily Progress Note Intern Pager: [phone]  Patient name: Daniel Hill Medical record number: [id number] Date of birth: 1976-04-23 Age: 49 y.o. Gender: male  Primary Care Provider: Thurmond Cathlyn LABOR., MD Consultants: [facility]plant Team, GI pending Code Status: Full  Pt Overview and Major Events to Date:  01/05: Admitted for pancreatitis with history of kidney/pancreas transplant  Assessment and Plan:  Daniel Hill is a 49yo M w/PMHx of kidney and pancreas transplant admitted for pancreatitis. Continues to have nausea today. Will investigate source of pancreatitis further.  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 He was prescribed Ranexa  for this by his cardiologist but did not tolerate it well and stopped taking it. He continues to have angina even when walking from his bed to his bathroom, but states it is not any worse than before. He denies any chest pain or SOB since admission. Has not taken statin due to intolerance in at least 2-3 months, and no aspirin  in 1 month.  - Last echo 10/2023 with EF 60-65% and no regional wall abnormalities. 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 Unclear baseline creatinine, gradually improving.  - Nephrology consulted, appreciate recommendations  - Tx for pyelonephritis not indicated at this time as UA does not show evidence of infection  - Consider reconsult if patient clinically worsens Chronic health problem HTN - restart home amlodipine  01/06  FEN/GI: Clear liquids PPx: Effient  10mg  Dispo:Home pending clinical improvement .   Subjective:  Patient was seen and examined at bedside. He states his nausea is worse today than on admission. Would like to drink liquids. No other complaints today.   Objective: Temp:  [97.8 F (36.6 C)-98.4 F (36.9 C)] 98 F (36.7 C) (01/06 0217) Pulse Rate:  [72-83] 83 (01/06 0600) Resp:  [11-20] 20 (01/06 0600) BP: (150-172)/(80-110) 167/90 (01/06 0600) SpO2:  [95 %-100 %] 97 % (01/06 0600) Physical Exam: General: sitting up in hospital bed in no acute distress Cardiovascular: RRR, no M/R/G Respiratory: Normal work of breathing on room air, CTAB, no W/R/R Abdomen: Mild epigastric guarding, no distention, BS present Extremities: No peripheral edema, moves all extremities equally  Laboratory: Most recent CBC Lab Results  Component Value Date   WBC 6.6 04/30/2024   HGB 9.5 (L) 04/30/2024   HCT 29.1 (L) 04/30/2024   MCV 92.4 04/30/2024   PLT 423 (H) 04/30/2024   Most recent BMP    Latest Ref Rng & Units 04/30/2024    5:09 AM  BMP  Glucose 70 - 99 mg/dL 81  BUN 6 - 20 mg/dL 47   Creatinine 9.38 - 1.24 mg/dL 7.52   Sodium 864 - 854 mmol/L 142   Potassium 3.5 - 5.1 mmol/L 4.8   Chloride 98 - 111 mmol/L 115   CO2 22 - 32 mmol/L 19   Calcium  8.9 - 10.3 mg/dL 9.0    Lupie Credit, DO 04/30/2024, 7:36 AM  PGY-1, [facility] FPTS Intern pager: [phone], text pages welcome Secure chat group [facility] Teaching Service   "

## 2024-04-30 NOTE — Assessment & Plan Note (Addendum)
 CKD stage 3a. Unclear baseline creatinine, gradually improving.  - Nephrology consulted, appreciate recommendations  - Tx for pyelonephritis not indicated at this time as UA does not show evidence of infection  - Consider reconsult if patient clinically worsens

## 2024-04-30 NOTE — Assessment & Plan Note (Addendum)
 Well managed on insulin  pump and CGM. - Continue home insulin  pump - Low threshold to initiate EndoTool if sugars consistently >300  - Consult diabetes coordinator

## 2024-04-30 NOTE — Assessment & Plan Note (Addendum)
 HTN - restart home amlodipine  01/06

## 2024-04-30 NOTE — Plan of Care (Signed)
 Called [facility] Kidney/pancreas transplant team. Dr Buell told me that patient's transplanted pancreas had atrophied and should treat this patient as a non transplant patient in terms of the pancreatitis given the CT read showed concern around his native pancreas. He did mention that he would recommend getting a tacrolimus  level as elevated tacrolimus  can cause pancreatitis as well.  They recommended having nephrology weight in on the stranding and increased edema around the transplant kidney. I informed him that we had consulted them and are awaiting a urine culture.    RUQ was concerning for possible acalculous cholecystitis. Will consult general surgery for consideration of surgical management and will continue antibiotics at this time.    Areta Saliva MD  PGY 3 [facility]

## 2024-04-30 NOTE — Assessment & Plan Note (Addendum)
-   Continue PT/OT as tolerated  - Clear liquid diet, advance diet as tolerated - IVF with NS 100mL/hr (hyperkalemia with LR) - Blood cultures NG <24 hours - Continue antibiotics: CTX, Flagyl  - AM CBC, BMP, lipid panel - Phenergan  25 mg q6h PRN for nausea, vomiting - Continue home Prograf   - Pain: acetaminophen  q6h PRN; consider morphine  for severe pain - Consult [facility] transplant team, and [facility]  - RUQ US  to r/o cholecysitis

## 2024-04-30 NOTE — H&P (View-Only) (Signed)
 `                                               Consultation Note   Referring Provider:   Family medicine teaching service  PCP: Thurmond Cathlyn LABOR., MD Primary Gastroenterologist:   Clem Sero, MD ( [facility]).     Reason for Consultation: Pancreatitis DOA: 04/29/2024         Hospital Day: 2   Assessment and Plan:  49 yo male with type I diabetes, gastroparesis, status post remote failed kidney/ pancreas transplant ( [facility]).  Intractable nausea / vomiting x 6 months.   Mild pancreatitis on non-contrast CT scan RUQ suggesting possible acalculous cholecystitis Chronic nausea and vomiting ,  definitely intractable over the last 6 months .  Though he has some upper abdominal discomfort, his main symptoms are intractable nausea and vomiting (multiple times a day ) which is atypical for acute pancreatitis  Possibly medication related as he has started multiple new medications in the last several months.  Surprisingly a recent gastric emptying study was essentially normal at 240 minutes.  Consider PUD / GOO. Patient was scheduled to get an upper endoscopy with his primary GI  ( [facility]) tomorrow to further evaluate N/V.   Regarding  findings of pancreatitis of native pancreas on noncontrast CT scan, etiology unclear but certainly need to consider some of his medications.  He does not consume alcohol.  This does not seem to be biliary pancreatitis.  Triglycerides historically normal, including the levels at end of November.  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  He is status post failed remote  kidney/pancreas transplant.   Patient was hospitalized in November 2025 with pyelonephritis of transplanted kidney.  Blood cultures were negative, UA was negative.  He was seen by infectious disease and kept on IV Zosyn  during that hospital stay.  Discharged home without any further need for antibiotics.    ++++++++++++++++++++++++++++++++++++++++  Patient presented to the ED yesterday with several week history of nausea and vomiting and a CT scan (outpatient) showing pancreatitis.  He has been vomiting multiple times a day.  He mainly vomits gastrointestinal fluid.  The vomiting is not related to eating.  He is constantly nauseated despite promethazine , scopolamine , and Zofran .  No hematemesis.  He has some associated upper abdominal discomfort but the main issue is intractable nausea and vomiting.  He was scheduled to have an EGD with his primary gastroenterologist tomorrow.  He was also going to have a screening colonoscopy at the same time.  However there was  some problems surrounding the holding of his Effient  .  Patient saw his nephrologist recently who ordered a CT scan which showed pancreatitis of the native pancreas as well as possible pyelonephritis  In the ED patient was afebrile, hypertensive.  Labs remarkable for normal white count.  Hemoglobin 11.1, hematocrit 34%, BUN 51, creatinine 2.7 (around baseline), lipase less than 10 , alk phos 127 , AST 17 , ALT 17 total bilirubin 0.3, normal high-sensitivity troponin, normal lactic acid . RUQ ultrasound - Trace pericholecystic fluid with borderline gallbladder  wall thickening anda positive sonographic murphy sign reported. No gallstone identified.    Recent Labs    04/29/24 1522 04/30/24 0509 04/30/24 1236  PROT 7.8 6.0* 6.3*  ALBUMIN 4.0 3.2* 3.3*  AST 17 14* 15  ALT 17 13 13   ALKPHOS 127* 95 103  BILITOT 0.3 0.2 <0.2   Recent Labs    04/29/24 1522 04/30/24 0509 04/30/24 1236  WBC 9.8 6.6 5.8  HGB 11.1* 9.5* 9.8*  HCT 34.2* 29.1* 30.2*  MCV 92.9 92.4 92.4  PLT 504* 423* 422*   Recent Labs    04/29/24 1522 04/30/24 0509 04/30/24 1236  NA 138 142 143  K 4.8 4.8 5.4*  CL 108 115* 116*  CO2 18* 19* 18*  GLUCOSE 177* 81 115*  BUN 51* 47* 46*  CREATININE 2.71* 2.47* 2.40*  CALCIUM  9.9 9.0 9.2    Review of Systems: All systems reviewed and negative except where noted in HPI.  Physical Exam: Vital signs in last 24 hours: Temp:  [97.8 F (36.6 C)-99.2 F (37.3 C)] 99.2 F (37.3 C) (01/06 1309) Pulse Rate:  [72-91] 84 (01/06 1400) Resp:  [7-24] 20 (01/06 1400) BP: (150-172)/(87-110) 159/88 (01/06 1400) SpO2:  [95 %-100 %] 100 % (01/06 1400) Weight:  [92.1 kg] 92.1 kg (01/06 0900)   General:  Pleasant male in NAD Psych:  Cooperative. Normal mood and affect Eyes: Pupils equal Ears:  Normal auditory acuity Nose: No deformity, discharge or lesions Neck:  Supple, no masses felt Lungs:  Clear to auscultation.  Heart:  Regular rate, regular rhythm.  Abdomen:  Soft, nondistended, nontender, active bowel sounds, no masses felt Rectal :  Deferred Msk: Symmetrical without gross deformities.  Neurologic:  Alert, oriented, grossly normal neurologically Extremities : No edema Skin:  Intact without significant lesions.   OUTPATIENT MEDICATIONS Prior to Admission medications  Medication Sig Start Date End Date Taking? Authorizing Provider  amLODipine  (NORVASC ) 10 MG tablet Take 1 tablet (10 mg total) by mouth daily. 12/14/23  Yes Ladona Heinz, MD  amoxicillin-clavulanate (AUGMENTIN) 500-125 MG tablet Take 1 tablet by mouth  2 (two) times daily. 04/23/24  Yes [provider]  esomeprazole  (NEXIUM ) 40 MG capsule Take 40 mg by mouth 2 (two) times daily.   Yes [provider]  ezetimibe  (ZETIA ) 10 MG tablet Take 10 mg by mouth daily.   Yes [provider]  furosemide  (LASIX ) 40 MG tablet Take 40 mg by mouth daily. 03/28/24  Yes [provider]  Insulin  Aspart, w/Niacinamide, (FIASP ) 100 UNIT/ML SOLN INJECT UP TO 50 UNITS UNDER THE SKIN VIA INSULIN  PUMP DAILY 11/14/23  Yes Trixie File, MD  isosorbide  mononitrate (IMDUR ) 120 MG 24 hr tablet Take 1 tablet (120 mg total) by mouth daily. 04/09/24  Yes Meng, Hao, PA  levothyroxine  (SYNTHROID ) 75 MCG tablet TAKE 1 TABLET(75 MCG) BY MOUTH DAILY 02/11/22  Yes Trixie File, MD  metoprolol  succinate (TOPROL -XL) 50 MG 24 hr tablet Take 1 tablet (50 mg  total) by mouth daily. Take with or immediately following a meal. 04/22/24 07/21/24 Yes Ladona Heinz, MD  nitroGLYCERIN  (NITROSTAT ) 0.4 MG SL tablet Place 1 tablet (0.4 mg total) under the tongue every 5 (five) minutes as needed for chest pain. 11/14/23 11/13/24 Yes Amin, Ankit C, MD  ondansetron  (ZOFRAN ) 8 MG tablet Take 8 mg by mouth every 8 (eight) hours as needed. 04/15/24  Yes [provider]  prasugrel  (EFFIENT ) 10 MG TABS tablet Take 1 tablet (10 mg total) by mouth daily. 12/05/23  Yes Ladona Heinz, MD  promethazine  (PHENERGAN ) 25 MG tablet Take 25 mg by mouth every 6 (six) hours as needed for nausea.   Yes [provider]  scopolamine  (TRANSDERM-SCOP) 1 MG/3DAYS Place 1 patch onto the skin every 3 (three) days. 04/25/24  Yes [provider]  tacrolimus  (PROGRAF ) 1 MG capsule Take 1-2 mg by mouth See admin instructions. Take 2 capsules by mouth in the morning and 1 capsule at night   Yes [provider]  traMADol  (ULTRAM ) 50 MG tablet Take 50 mg by mouth every 6 (six) hours as needed for pain. 05/16/17  Yes [provider]  VELTASSA  8.4 g packet Take 8.4  g by mouth every other day.   Yes [provider]  fluticasone  (FLONASE ) 50 MCG/ACT nasal spray Place 1 spray into both nostrils daily as needed for allergies. Patient not taking: Reported on 04/29/2024 06/12/23   [provider]  furosemide  (LASIX ) 20 MG tablet Take 20 mg by mouth daily as needed for edema (for legs). Patient not taking: Reported on 04/09/2024    [provider]  Glucagon  (BAQSIMI  TWO PACK) 3 MG/DOSE POWD Place 3 mg into the nose once as needed for up to 1 dose. Patient not taking: Reported on 04/29/2024 03/16/20   Trixie File, MD  MAGNESIUM  GLYCINATE PLUS PO Take 1 capsule by mouth daily. Patient not taking: Reported on 04/29/2024    [provider]  Multiple Vitamins-Minerals (MULTIVITAMINS THER. W/MINERALS) TABS Take 1 tablet by mouth daily. Patient not taking: Reported on 04/29/2024    [provider]  ranolazine  (RANEXA ) 500 MG 12 hr tablet Take 1 tablet (500 mg total) by mouth 2 (two) times daily. Patient not taking: Reported on 04/29/2024 03/20/24   Ladona Heinz, MD  rosuvastatin  (CRESTOR ) 20 MG tablet Take 1 tablet (20 mg total) by mouth daily. Patient not taking: Reported on 04/29/2024 03/20/24 06/18/24  Ladona Heinz, MD    Allergies as of 04/29/2024 - Review Complete 04/29/2024  Allergen Reaction Noted   Vancomycin Shortness Of Breath 04/29/2024   Lipitor [atorvastatin ] Other (See Comments) 03/20/2024   Other Nausea And Vomiting 02/12/2021   Codeine Nausea And Vomiting 05/18/2011   Metoclopramide hcl Nausea And Vomiting 05/18/2011    INPATIENT MEDICATIONS Current Facility-Administered Medications  Medication Dose Route Frequency Provider Last Rate Last Admin   0.9 %  sodium chloride  infusion   Intravenous Continuous Majeed, Sara, DO       acetaminophen  (TYLENOL ) tablet 650 mg  650 mg Oral Q6H PRN Lafe Domino, DO       amLODipine  (NORVASC ) tablet 10 mg  10 mg Oral Daily Suknaim, Kulkaew B, DO   10 mg at 04/30/24 1225    capsicum (ZOSTRIX) 0.075 % cream   Topical BID Majeed, Sara, DO       cefTRIAXone  (ROCEPHIN ) 2 g in sodium chloride  0.9 % 100 mL IVPB  2 g Intravenous Q24H Everhart, Kirstie, DO   Stopped at 04/30/24 1115  esomeprazole  (NEXIUM ) capsule 40 mg  40 mg Oral Daily Delores Suzann HERO, MD   40 mg at 04/30/24 1116   fluticasone  (FLONASE ) 50 MCG/ACT nasal spray 1 spray  1 spray Each Nare Daily PRN Lafe Domino, DO       heparin  injection 5,000 Units  5,000 Units Subcutaneous Q8H Lafe Domino, DO   5,000 Units at 04/30/24 9495   isosorbide  mononitrate (IMDUR ) 24 hr tablet 120 mg  120 mg Oral Daily Lafe Domino, DO   120 mg at 04/30/24 1021   levothyroxine  (SYNTHROID ) tablet 75 mcg  75 mcg Oral Q0600 Lafe Domino, DO   75 mcg at 04/30/24 0505   lidocaine  (LIDODERM ) 5 % 1 patch  1 patch Transdermal Q24H Lupie Credit, DO       metoprolol  succinate (TOPROL -XL) 24 hr tablet 50 mg  50 mg Oral Daily Lafe Domino, DO   50 mg at 04/30/24 1021   metroNIDAZOLE  (FLAGYL ) IVPB 500 mg  500 mg Intravenous Q12H Everhart, Kirstie, DO   Stopped at 04/30/24 0604   morphine  (PF) 4 MG/ML injection 4 mg  4 mg Intravenous Once Patt Alm Macho, MD       ondansetron  (ZOFRAN ) injection 4 mg  4 mg Intravenous Once Patt Alm Macho, MD       patiromer  (VELTASSA ) packet 8.4 g  8.4 g Oral QODAY Spence, Sarah, DO       perflutren  lipid microspheres (DEFINITY ) IV suspension  1-10 mL Intravenous PRN Delores Suzann HERO, MD   3 mL at 04/30/24 1430   polyethylene glycol (MIRALAX  / GLYCOLAX ) packet 17 g  17 g Oral Daily PRN Lafe Domino, DO       prasugrel  (EFFIENT ) tablet 10 mg  10 mg Oral Daily Lafe Domino, DO   10 mg at 04/30/24 1117   promethazine  (PHENERGAN ) tablet 25 mg  25 mg Oral Q6H PRN Lafe Domino, DO   25 mg at 04/29/24 1949   tacrolimus  (PROGRAF ) capsule 1 mg  1 mg Oral QHS Pruett, Kellner L, MD   1 mg at 04/29/24 2136   tacrolimus  (PROGRAF ) capsule 2 mg  2 mg Oral q morning Pruett, Milda CROME, MD   2 mg at 04/30/24 1034      Past Medical History:  Diagnosis Date   Anxiety    Depression    Diabetes mellitus    GERD (gastroesophageal reflux disease)    Hypothyroidism    Kidney replaced by transplant    Pancreas transplanted [facility])    Thyroid  disease     Past Surgical History:  Procedure Laterality Date   CORONARY ANGIOGRAPHY N/A 11/13/2023   Procedure: CORONARY ANGIOGRAPHY;  Surgeon: Jordan, Peter M, MD;  Location: [facility] INVASIVE CV LAB;  Service: Cardiovascular;  Laterality: N/A;   EYE SURGERY      Family History  Problem Relation Age of Onset   Stroke Mother    Lymphoma Father    Diabetes type II Father    Heart attack Brother    Thyroid  cancer Brother     Social History   Socioeconomic History   Marital status: Married    Spouse name: Not on file   Number of children: Not on file   Years of education: Not on file   Highest education level: Not on file  Occupational History   Not on file  Tobacco Use   Smoking status: Never   Smokeless tobacco: Never  Vaping Use   Vaping status: Never Used  Substance and Sexual Activity  Alcohol use: Yes    Comment: Rarely - maybe once a year   Drug use: No   Sexual activity: Not on file  Other Topics Concern   Not on file  Social History Narrative   Not on file   Social Drivers of Health   Tobacco Use: Low Risk (04/09/2024)   Patient History    Smoking Tobacco Use: Never    Smokeless Tobacco Use: Never    Passive Exposure: Not on file  Financial Resource Strain: Not on file  Food Insecurity: No Food Insecurity (04/30/2024)   Epic    Worried About Programme Researcher, Broadcasting/film/video in the Last Year: Never true    Ran Out of Food in the Last Year: Never true  Transportation Needs: No Transportation Needs (04/30/2024)   Epic    Lack of Transportation (Medical): No    Lack of Transportation (Non-Medical): No  Physical Activity: Not on file  Stress: Not on file  Social Connections: Not on file  Intimate Partner Violence: Not At Risk (04/30/2024)   Epic     Fear of Current or Ex-Partner: No    Emotionally Abused: No    Physically Abused: No    Sexually Abused: No  Depression (PHQ2-9): Not on file  Alcohol Screen: Not on file  Housing: Low Risk (04/30/2024)   Epic    Unable to Pay for Housing in the Last Year: No    Number of Times Moved in the Last Year: 0    Homeless in the Last Year: No  Utilities: Not At Risk (04/30/2024)   Epic    Threatened with loss of utilities: No  Health Literacy: Not on file    Code Status   Code Status: Full Code   Vina Dasen, NP-C   04/30/2024, 4:02 PM  ----------------------------------------------------------------------------  I have taken a history, reviewed the chart and examined the patient. I performed a substantive portion of this encounter, including complete performance of at least one of the key components, in conjunction with the APP. I agree with the APP's note, impression and recommendations  49 year old male with history of type 1 diabetes, history of kidney and pancreas transplant, coronary artery disease who was admitted with ongoing intractable nausea and vomiting and found to have CT findings suggestive of pancreatitis (atrophic pancreas with hazy surroundings) in the native pancreas.  Lipase normal.  Liver enzymes normal. The patient does not have any symptoms to suggest acute pancreatitis.  He has longstanding symptoms of recurrent nausea and vomiting but does not have any significant abdominal pain or abdominal tenderness.  Ultrasound showed borderline gallbladder wall thickness and trace Perry cholecystic fluid with a positive sonographic Murphy's, but no stones or sludge.  HIDA scan has been completed to assess for acalculous cholecystitis/gallbladder dysfunction.  Results are still pending, but the patient states that they are able to watch the images of the study afterwards and noted feeling and emptying of the gallbladder.  The patient has been following with an outpatient  gastroenterologist and was scheduled for an EGD and colonoscopy this week, which was canceled due to the CT findings.  A gastric emptying study showed borderline gastroparesis, and this may be contributing to some of his symptoms. He reports being intolerant to metoclopramide.  The etiology of his chronic nausea and vomiting remains unclear.  We discussed possible etiologies such as medication side effects (he has been placed on numerous new medications since his heart attack last year) as well as possible gut brain axis disorders such  as cyclic vomiting syndrome.  We discussed how it is unlikely that we will find a definitive cause to his chronic nausea and vomiting symptoms during this hospitalization.  I do not think he has acute pancreatitis, but we can assess for evidence of chronic pancreatitis/autoimmune pancreatitis.  Although I think it is unlikely that he has peptic ulcer disease, H. pylori infection or gastric outlet obstruction, he has not had an upper endoscopy to evaluate the symptoms, and I do think this is necessary to exclude a luminal etiology for his symptoms.  Although his HIDA scan is still pending, I think it is very unlikely to be abnormal and the cause of his symptoms, and so I recommended we proceed with an upper endoscopy tomorrow.  Patient is aware that endoscopic interventions will be limited to visualization and superficial biopsies given his recent Effient  use.  Will order IgG4 levels and await tacrolimus  levels. Although symptoms not consistent with exocrine pancreatic insufficiency, we will obtain pancreatic elastase and consider restarting pancreatic enzymes.   Marv Alfrey E. Stacia, MD [facility]  I spent a total of 45 minutes reviewing the patient's medical record, interviewing and examining the patient, discussing his diagnosis and management of his condition going forward, and documenting in the medical record

## 2024-04-30 NOTE — Plan of Care (Signed)

## 2024-04-30 NOTE — Assessment & Plan Note (Addendum)
 Patient reports history of significant angina on exertion even before his MI in July 2025. He was prescribed Ranexa  for this by his cardiologist but did not tolerate it well and stopped taking it. He continues to have angina even when walking from his bed to his bathroom, but states it is not any worse than before. He denies any chest pain or SOB since admission. Has not taken statin due to intolerance in at least 2-3 months, and no aspirin  in 1 month.  - Last echo 10/2023 with EF 60-65% and no regional wall abnormalities. - Repeat echo today in the setting of statin intolerance and lack thereof - Consider cardiology consult pending echo results

## 2024-04-30 NOTE — Consult Note (Cosign Needed)
 `                                               Consultation Note   Referring Provider:   Family medicine teaching service  PCP: Thurmond Cathlyn LABOR., MD Primary Gastroenterologist:   Clem Sero, MD ( [facility]).     Reason for Consultation: Pancreatitis DOA: 04/29/2024         Hospital Day: 2   Assessment and Plan:  49 yo male with type I diabetes, gastroparesis, status post remote failed kidney/ pancreas transplant ( [facility]).  Intractable nausea / vomiting x 6 months.   Mild pancreatitis on non-contrast CT scan RUQ suggesting possible acalculous cholecystitis Chronic nausea and vomiting ,  definitely intractable over the last 6 months .  Though he has some upper abdominal discomfort, his main symptoms are intractable nausea and vomiting (multiple times a day ) which is atypical for acute pancreatitis  Possibly medication related as he has started multiple new medications in the last several months.  Surprisingly a recent gastric emptying study was essentially normal at 240 minutes.  Consider PUD / GOO. Patient was scheduled to get an upper endoscopy with his primary GI  ( [facility]) tomorrow to further evaluate N/V.   Regarding  findings of pancreatitis of native pancreas on noncontrast CT scan, etiology unclear but certainly need to consider some of his medications.  He does not consume alcohol.  This does not seem to be biliary pancreatitis.  Triglycerides historically normal, including the levels at end of November.  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  He is status post failed remote  kidney/pancreas transplant.   Patient was hospitalized in November 2025 with pyelonephritis of transplanted kidney.  Blood cultures were negative, UA was negative.  He was seen by infectious disease and kept on IV Zosyn  during that hospital stay.  Discharged home without any further need for antibiotics.    ++++++++++++++++++++++++++++++++++++++++  Patient presented to the ED yesterday with several week history of nausea and vomiting and a CT scan (outpatient) showing pancreatitis.  He has been vomiting multiple times a day.  He mainly vomits gastrointestinal fluid.  The vomiting is not related to eating.  He is constantly nauseated despite promethazine , scopolamine , and Zofran .  No hematemesis.  He has some associated upper abdominal discomfort but the main issue is intractable nausea and vomiting.  He was scheduled to have an EGD with his primary gastroenterologist tomorrow.  He was also going to have a screening colonoscopy at the same time.  However there was  some problems surrounding the holding of his Effient  .  Patient saw his nephrologist recently who ordered a CT scan which showed pancreatitis of the native pancreas as well as possible pyelonephritis  In the ED patient was afebrile, hypertensive.  Labs remarkable for normal white count.  Hemoglobin 11.1, hematocrit 34%, BUN 51, creatinine 2.7 (around baseline), lipase less than 10 , alk phos 127 , AST 17 , ALT 17 total bilirubin 0.3, normal high-sensitivity troponin, normal lactic acid . RUQ ultrasound - Trace pericholecystic fluid with borderline gallbladder  wall thickening anda positive sonographic murphy sign reported. No gallstone identified.    Recent Labs    04/29/24 1522 04/30/24 0509 04/30/24 1236  PROT 7.8 6.0* 6.3*  ALBUMIN 4.0 3.2* 3.3*  AST 17 14* 15  ALT 17 13 13   ALKPHOS 127* 95 103  BILITOT 0.3 0.2 <0.2   Recent Labs    04/29/24 1522 04/30/24 0509 04/30/24 1236  WBC 9.8 6.6 5.8  HGB 11.1* 9.5* 9.8*  HCT 34.2* 29.1* 30.2*  MCV 92.9 92.4 92.4  PLT 504* 423* 422*   Recent Labs    04/29/24 1522 04/30/24 0509 04/30/24 1236  NA 138 142 143  K 4.8 4.8 5.4*  CL 108 115* 116*  CO2 18* 19* 18*  GLUCOSE 177* 81 115*  BUN 51* 47* 46*  CREATININE 2.71* 2.47* 2.40*  CALCIUM  9.9 9.0 9.2    Review of Systems: All systems reviewed and negative except where noted in HPI.  Physical Exam: Vital signs in last 24 hours: Temp:  [97.8 F (36.6 C)-99.2 F (37.3 C)] 99.2 F (37.3 C) (01/06 1309) Pulse Rate:  [72-91] 84 (01/06 1400) Resp:  [7-24] 20 (01/06 1400) BP: (150-172)/(87-110) 159/88 (01/06 1400) SpO2:  [95 %-100 %] 100 % (01/06 1400) Weight:  [92.1 kg] 92.1 kg (01/06 0900)   General:  Pleasant male in NAD Psych:  Cooperative. Normal mood and affect Eyes: Pupils equal Ears:  Normal auditory acuity Nose: No deformity, discharge or lesions Neck:  Supple, no masses felt Lungs:  Clear to auscultation.  Heart:  Regular rate, regular rhythm.  Abdomen:  Soft, nondistended, nontender, active bowel sounds, no masses felt Rectal :  Deferred Msk: Symmetrical without gross deformities.  Neurologic:  Alert, oriented, grossly normal neurologically Extremities : No edema Skin:  Intact without significant lesions.   OUTPATIENT MEDICATIONS Prior to Admission medications  Medication Sig Start Date End Date Taking? Authorizing Provider  amLODipine  (NORVASC ) 10 MG tablet Take 1 tablet (10 mg total) by mouth daily. 12/14/23  Yes Ladona Heinz, MD  amoxicillin-clavulanate (AUGMENTIN) 500-125 MG tablet Take 1 tablet by mouth  2 (two) times daily. 04/23/24  Yes [provider]  esomeprazole  (NEXIUM ) 40 MG capsule Take 40 mg by mouth 2 (two) times daily.   Yes [provider]  ezetimibe  (ZETIA ) 10 MG tablet Take 10 mg by mouth daily.   Yes [provider]  furosemide  (LASIX ) 40 MG tablet Take 40 mg by mouth daily. 03/28/24  Yes [provider]  Insulin  Aspart, w/Niacinamide, (FIASP ) 100 UNIT/ML SOLN INJECT UP TO 50 UNITS UNDER THE SKIN VIA INSULIN  PUMP DAILY 11/14/23  Yes Trixie File, MD  isosorbide  mononitrate (IMDUR ) 120 MG 24 hr tablet Take 1 tablet (120 mg total) by mouth daily. 04/09/24  Yes Meng, Hao, PA  levothyroxine  (SYNTHROID ) 75 MCG tablet TAKE 1 TABLET(75 MCG) BY MOUTH DAILY 02/11/22  Yes Trixie File, MD  metoprolol  succinate (TOPROL -XL) 50 MG 24 hr tablet Take 1 tablet (50 mg  total) by mouth daily. Take with or immediately following a meal. 04/22/24 07/21/24 Yes Ladona Heinz, MD  nitroGLYCERIN  (NITROSTAT ) 0.4 MG SL tablet Place 1 tablet (0.4 mg total) under the tongue every 5 (five) minutes as needed for chest pain. 11/14/23 11/13/24 Yes Amin, Ankit C, MD  ondansetron  (ZOFRAN ) 8 MG tablet Take 8 mg by mouth every 8 (eight) hours as needed. 04/15/24  Yes [provider]  prasugrel  (EFFIENT ) 10 MG TABS tablet Take 1 tablet (10 mg total) by mouth daily. 12/05/23  Yes Ladona Heinz, MD  promethazine  (PHENERGAN ) 25 MG tablet Take 25 mg by mouth every 6 (six) hours as needed for nausea.   Yes [provider]  scopolamine  (TRANSDERM-SCOP) 1 MG/3DAYS Place 1 patch onto the skin every 3 (three) days. 04/25/24  Yes [provider]  tacrolimus  (PROGRAF ) 1 MG capsule Take 1-2 mg by mouth See admin instructions. Take 2 capsules by mouth in the morning and 1 capsule at night   Yes [provider]  traMADol  (ULTRAM ) 50 MG tablet Take 50 mg by mouth every 6 (six) hours as needed for pain. 05/16/17  Yes [provider]  VELTASSA  8.4 g packet Take 8.4  g by mouth every other day.   Yes [provider]  fluticasone  (FLONASE ) 50 MCG/ACT nasal spray Place 1 spray into both nostrils daily as needed for allergies. Patient not taking: Reported on 04/29/2024 06/12/23   [provider]  furosemide  (LASIX ) 20 MG tablet Take 20 mg by mouth daily as needed for edema (for legs). Patient not taking: Reported on 04/09/2024    [provider]  Glucagon  (BAQSIMI  TWO PACK) 3 MG/DOSE POWD Place 3 mg into the nose once as needed for up to 1 dose. Patient not taking: Reported on 04/29/2024 03/16/20   Trixie File, MD  MAGNESIUM  GLYCINATE PLUS PO Take 1 capsule by mouth daily. Patient not taking: Reported on 04/29/2024    [provider]  Multiple Vitamins-Minerals (MULTIVITAMINS THER. W/MINERALS) TABS Take 1 tablet by mouth daily. Patient not taking: Reported on 04/29/2024    [provider]  ranolazine  (RANEXA ) 500 MG 12 hr tablet Take 1 tablet (500 mg total) by mouth 2 (two) times daily. Patient not taking: Reported on 04/29/2024 03/20/24   Ladona Heinz, MD  rosuvastatin  (CRESTOR ) 20 MG tablet Take 1 tablet (20 mg total) by mouth daily. Patient not taking: Reported on 04/29/2024 03/20/24 06/18/24  Ladona Heinz, MD    Allergies as of 04/29/2024 - Review Complete 04/29/2024  Allergen Reaction Noted   Vancomycin Shortness Of Breath 04/29/2024   Lipitor [atorvastatin ] Other (See Comments) 03/20/2024   Other Nausea And Vomiting 02/12/2021   Codeine Nausea And Vomiting 05/18/2011   Metoclopramide hcl Nausea And Vomiting 05/18/2011    INPATIENT MEDICATIONS Current Facility-Administered Medications  Medication Dose Route Frequency Provider Last Rate Last Admin   0.9 %  sodium chloride  infusion   Intravenous Continuous Majeed, Camie, DO       acetaminophen  (TYLENOL ) tablet 650 mg  650 mg Oral Q6H PRN Lafe Domino, DO       amLODipine  (NORVASC ) tablet 10 mg  10 mg Oral Daily Suknaim, Kulkaew B, DO   10 mg at 04/30/24 1225    capsicum (ZOSTRIX) 0.075 % cream   Topical BID Majeed, Sara, DO       cefTRIAXone  (ROCEPHIN ) 2 g in sodium chloride  0.9 % 100 mL IVPB  2 g Intravenous Q24H Everhart, Kirstie, DO   Stopped at 04/30/24 1115  esomeprazole  (NEXIUM ) capsule 40 mg  40 mg Oral Daily Delores Suzann HERO, MD   40 mg at 04/30/24 1116   fluticasone  (FLONASE ) 50 MCG/ACT nasal spray 1 spray  1 spray Each Nare Daily PRN Lafe Domino, DO       heparin  injection 5,000 Units  5,000 Units Subcutaneous Q8H Lafe Domino, DO   5,000 Units at 04/30/24 9495   isosorbide  mononitrate (IMDUR ) 24 hr tablet 120 mg  120 mg Oral Daily Lafe Domino, DO   120 mg at 04/30/24 1021   levothyroxine  (SYNTHROID ) tablet 75 mcg  75 mcg Oral Q0600 Lafe Domino, DO   75 mcg at 04/30/24 0505   lidocaine  (LIDODERM ) 5 % 1 patch  1 patch Transdermal Q24H Lupie Credit, DO       metoprolol  succinate (TOPROL -XL) 24 hr tablet 50 mg  50 mg Oral Daily Lafe Domino, DO   50 mg at 04/30/24 1021   metroNIDAZOLE  (FLAGYL ) IVPB 500 mg  500 mg Intravenous Q12H Everhart, Kirstie, DO   Stopped at 04/30/24 0604   morphine  (PF) 4 MG/ML injection 4 mg  4 mg Intravenous Once Patt Alm Macho, MD       ondansetron  (ZOFRAN ) injection 4 mg  4 mg Intravenous Once Patt Alm Macho, MD       patiromer  (VELTASSA ) packet 8.4 g  8.4 g Oral QODAY Spence, Sarah, DO       perflutren  lipid microspheres (DEFINITY ) IV suspension  1-10 mL Intravenous PRN Delores Suzann HERO, MD   3 mL at 04/30/24 1430   polyethylene glycol (MIRALAX  / GLYCOLAX ) packet 17 g  17 g Oral Daily PRN Lafe Domino, DO       prasugrel  (EFFIENT ) tablet 10 mg  10 mg Oral Daily Lafe Domino, DO   10 mg at 04/30/24 1117   promethazine  (PHENERGAN ) tablet 25 mg  25 mg Oral Q6H PRN Lafe Domino, DO   25 mg at 04/29/24 1949   tacrolimus  (PROGRAF ) capsule 1 mg  1 mg Oral QHS Pruett, Kellner L, MD   1 mg at 04/29/24 2136   tacrolimus  (PROGRAF ) capsule 2 mg  2 mg Oral q morning Pruett, Milda CROME, MD   2 mg at 04/30/24 1034      Past Medical History:  Diagnosis Date   Anxiety    Depression    Diabetes mellitus    GERD (gastroesophageal reflux disease)    Hypothyroidism    Kidney replaced by transplant    Pancreas transplanted [facility])    Thyroid  disease     Past Surgical History:  Procedure Laterality Date   CORONARY ANGIOGRAPHY N/A 11/13/2023   Procedure: CORONARY ANGIOGRAPHY;  Surgeon: Jordan, Peter M, MD;  Location: [facility] INVASIVE CV LAB;  Service: Cardiovascular;  Laterality: N/A;   EYE SURGERY      Family History  Problem Relation Age of Onset   Stroke Mother    Lymphoma Father    Diabetes type II Father    Heart attack Brother    Thyroid  cancer Brother     Social History   Socioeconomic History   Marital status: Married    Spouse name: Not on file   Number of children: Not on file   Years of education: Not on file   Highest education level: Not on file  Occupational History   Not on file  Tobacco Use   Smoking status: Never   Smokeless tobacco: Never  Vaping Use   Vaping status: Never Used  Substance and Sexual Activity  Alcohol use: Yes    Comment: Rarely - maybe once a year   Drug use: No   Sexual activity: Not on file  Other Topics Concern   Not on file  Social History Narrative   Not on file   Social Drivers of Health   Tobacco Use: Low Risk (04/09/2024)   Patient History    Smoking Tobacco Use: Never    Smokeless Tobacco Use: Never    Passive Exposure: Not on file  Financial Resource Strain: Not on file  Food Insecurity: No Food Insecurity (04/30/2024)   Epic    Worried About Programme Researcher, Broadcasting/film/video in the Last Year: Never true    Ran Out of Food in the Last Year: Never true  Transportation Needs: No Transportation Needs (04/30/2024)   Epic    Lack of Transportation (Medical): No    Lack of Transportation (Non-Medical): No  Physical Activity: Not on file  Stress: Not on file  Social Connections: Not on file  Intimate Partner Violence: Not At Risk (04/30/2024)   Epic     Fear of Current or Ex-Partner: No    Emotionally Abused: No    Physically Abused: No    Sexually Abused: No  Depression (PHQ2-9): Not on file  Alcohol Screen: Not on file  Housing: Low Risk (04/30/2024)   Epic    Unable to Pay for Housing in the Last Year: No    Number of Times Moved in the Last Year: 0    Homeless in the Last Year: No  Utilities: Not At Risk (04/30/2024)   Epic    Threatened with loss of utilities: No  Health Literacy: Not on file    Code Status   Code Status: Full Code   Vina Dasen, NP-C   04/30/2024, 4:02 PM  ----------------------------------------------------------------------------  I have taken a history, reviewed the chart and examined the patient. I performed a substantive portion of this encounter, including complete performance of at least one of the key components, in conjunction with the APP. I agree with the APP's note, impression and recommendations  49 year old male with history of type 1 diabetes, history of kidney and pancreas transplant, coronary artery disease who was admitted with ongoing intractable nausea and vomiting and found to have CT findings suggestive of pancreatitis (atrophic pancreas with hazy surroundings) in the native pancreas.  Lipase normal.  Liver enzymes normal. The patient does not have any symptoms to suggest acute pancreatitis.  He has longstanding symptoms of recurrent nausea and vomiting but does not have any significant abdominal pain or abdominal tenderness.  Ultrasound showed borderline gallbladder wall thickness and trace Perry cholecystic fluid with a positive sonographic Murphy's, but no stones or sludge.  HIDA scan has been completed to assess for acalculous cholecystitis/gallbladder dysfunction.  Results are still pending, but the patient states that they are able to watch the images of the study afterwards and noted feeling and emptying of the gallbladder.  The patient has been following with an outpatient  gastroenterologist and was scheduled for an EGD and colonoscopy this week, which was canceled due to the CT findings.  A gastric emptying study showed borderline gastroparesis, and this may be contributing to some of his symptoms. He reports being intolerant to metoclopramide.  The etiology of his chronic nausea and vomiting remains unclear.  We discussed possible etiologies such as medication side effects (he has been placed on numerous new medications since his heart attack last year) as well as possible gut brain axis disorders such  as cyclic vomiting syndrome.  We discussed how it is unlikely that we will find a definitive cause to his chronic nausea and vomiting symptoms during this hospitalization.  I do not think he has acute pancreatitis, but we can assess for evidence of chronic pancreatitis/autoimmune pancreatitis.  Although I think it is unlikely that he has peptic ulcer disease, H. pylori infection or gastric outlet obstruction, he has not had an upper endoscopy to evaluate the symptoms, and I do think this is necessary to exclude a luminal etiology for his symptoms.  Although his HIDA scan is still pending, I think it is very unlikely to be abnormal and the cause of his symptoms, and so I recommended we proceed with an upper endoscopy tomorrow.  Patient is aware that endoscopic interventions will be limited to visualization and superficial biopsies given his recent Effient  use.  Will order IgG4 levels and await tacrolimus  levels. Although symptoms not consistent with exocrine pancreatic insufficiency, we will obtain pancreatic elastase and consider restarting pancreatic enzymes.   Scott E. Stacia, MD [facility]  I spent a total of 45 minutes reviewing the patient's medical record, interviewing and examining the patient, discussing his diagnosis and management of his condition going forward, and documenting in the medical record

## 2024-05-01 ENCOUNTER — Inpatient Hospital Stay (HOSPITAL_COMMUNITY)

## 2024-05-01 ENCOUNTER — Encounter (HOSPITAL_COMMUNITY): Payer: Self-pay | Admitting: Family Medicine

## 2024-05-01 DIAGNOSIS — K8500 Idiopathic acute pancreatitis without necrosis or infection: Secondary | ICD-10-CM | POA: Diagnosis not present

## 2024-05-01 DIAGNOSIS — K5909 Other constipation: Secondary | ICD-10-CM

## 2024-05-01 DIAGNOSIS — R933 Abnormal findings on diagnostic imaging of other parts of digestive tract: Secondary | ICD-10-CM | POA: Diagnosis not present

## 2024-05-01 DIAGNOSIS — K2950 Unspecified chronic gastritis without bleeding: Secondary | ICD-10-CM | POA: Diagnosis not present

## 2024-05-01 DIAGNOSIS — K3189 Other diseases of stomach and duodenum: Secondary | ICD-10-CM | POA: Diagnosis not present

## 2024-05-01 DIAGNOSIS — Z9483 Pancreas transplant status: Secondary | ICD-10-CM | POA: Diagnosis not present

## 2024-05-01 DIAGNOSIS — Z940 Kidney transplant status: Secondary | ICD-10-CM | POA: Diagnosis not present

## 2024-05-01 DIAGNOSIS — R112 Nausea with vomiting, unspecified: Secondary | ICD-10-CM | POA: Diagnosis not present

## 2024-05-01 DIAGNOSIS — R188 Other ascites: Secondary | ICD-10-CM | POA: Diagnosis not present

## 2024-05-01 LAB — URINE CULTURE: Culture: NO GROWTH

## 2024-05-01 LAB — LIPID PANEL
Cholesterol: 160 mg/dL (ref 0–200)
HDL: 36 mg/dL — ABNORMAL LOW
LDL Cholesterol: 104 mg/dL — ABNORMAL HIGH (ref 0–99)
Total CHOL/HDL Ratio: 4.5 ratio
Triglycerides: 101 mg/dL
VLDL: 20 mg/dL (ref 0–40)

## 2024-05-01 LAB — COMPREHENSIVE METABOLIC PANEL WITH GFR
ALT: 11 U/L (ref 0–44)
AST: 12 U/L — ABNORMAL LOW (ref 15–41)
Albumin: 3.2 g/dL — ABNORMAL LOW (ref 3.5–5.0)
Alkaline Phosphatase: 90 U/L (ref 38–126)
Anion gap: 8 (ref 5–15)
BUN: 41 mg/dL — ABNORMAL HIGH (ref 6–20)
CO2: 18 mmol/L — ABNORMAL LOW (ref 22–32)
Calcium: 9 mg/dL (ref 8.9–10.3)
Chloride: 115 mmol/L — ABNORMAL HIGH (ref 98–111)
Creatinine, Ser: 2.41 mg/dL — ABNORMAL HIGH (ref 0.61–1.24)
GFR, Estimated: 32 mL/min — ABNORMAL LOW
Glucose, Bld: 100 mg/dL — ABNORMAL HIGH (ref 70–99)
Potassium: 4.6 mmol/L (ref 3.5–5.1)
Sodium: 142 mmol/L (ref 135–145)
Total Bilirubin: 0.2 mg/dL (ref 0.0–1.2)
Total Protein: 5.8 g/dL — ABNORMAL LOW (ref 6.5–8.1)

## 2024-05-01 LAB — CBC
HCT: 29 % — ABNORMAL LOW (ref 39.0–52.0)
Hemoglobin: 9.4 g/dL — ABNORMAL LOW (ref 13.0–17.0)
MCH: 29.5 pg (ref 26.0–34.0)
MCHC: 32.4 g/dL (ref 30.0–36.0)
MCV: 90.9 fL (ref 80.0–100.0)
Platelets: 394 K/uL (ref 150–400)
RBC: 3.19 MIL/uL — ABNORMAL LOW (ref 4.22–5.81)
RDW: 13.2 % (ref 11.5–15.5)
WBC: 6.4 K/uL (ref 4.0–10.5)
nRBC: 0 % (ref 0.0–0.2)

## 2024-05-01 LAB — GLUCOSE, CAPILLARY
Glucose-Capillary: 95 mg/dL (ref 70–99)
Glucose-Capillary: 127 mg/dL — ABNORMAL HIGH (ref 70–99)
Glucose-Capillary: 159 mg/dL — ABNORMAL HIGH (ref 70–99)
Glucose-Capillary: 246 mg/dL — ABNORMAL HIGH (ref 70–99)
Glucose-Capillary: 219 mg/dL — ABNORMAL HIGH (ref 70–99)

## 2024-05-01 MED ORDER — TECHNETIUM TC 99M MEBROFENIN IV KIT
5.0000 | PACK | Freq: Once | INTRAVENOUS | Status: AC
  Administered 2024-05-01: 5.2 via INTRAVENOUS

## 2024-05-01 MED ORDER — FAMOTIDINE 20 MG PO TABS
20.0000 mg | ORAL_TABLET | Freq: Every day | ORAL | Status: DC
  Administered 2024-05-01: 20 mg via ORAL
  Filled 2024-05-01: qty 1

## 2024-05-01 MED ORDER — ALUM & MAG HYDROXIDE-SIMETH 200-200-20 MG/5ML PO SUSP
30.0000 mL | Freq: Once | ORAL | Status: AC
  Administered 2024-05-01: 30 mL via ORAL
  Filled 2024-05-01: qty 30

## 2024-05-01 MED ORDER — SODIUM CHLORIDE 0.9 % IV SOLN
12.5000 mg | Freq: Four times a day (QID) | INTRAVENOUS | Status: DC | PRN
  Administered 2024-05-01 – 2024-05-02 (×3): 12.5 mg via INTRAVENOUS
  Filled 2024-05-01 (×3): qty 12.5

## 2024-05-01 MED ORDER — FAMOTIDINE IN NACL 20-0.9 MG/50ML-% IV SOLN
20.0000 mg | Freq: Once | INTRAVENOUS | Status: AC
  Administered 2024-05-01: 20 mg via INTRAVENOUS
  Filled 2024-05-01: qty 50

## 2024-05-01 MED ORDER — CALCIUM CARBONATE ANTACID 500 MG PO CHEW
1.0000 | CHEWABLE_TABLET | Freq: Every day | ORAL | Status: DC
  Administered 2024-05-01: 200 mg via ORAL
  Filled 2024-05-01: qty 1

## 2024-05-01 NOTE — Assessment & Plan Note (Addendum)
"  Denies any CP, SOB since admission and does not report worsening quality of angina with exertion.  - Last echo 10/2023 with EF 60-65% and no regional wall abnormalities. - Echo 01/06: >75% EF, hyperdynamic  - Most likely due to elevated BP as well as pancreatitis  - Continue to monitor, continue home amlodipine , Imdur  and metoprolol  "

## 2024-05-01 NOTE — Plan of Care (Signed)

## 2024-05-01 NOTE — Assessment & Plan Note (Addendum)
 HTN - continue home amlodipine   Kidney/pancreas transplant recipient: continue home Prograf 

## 2024-05-01 NOTE — Assessment & Plan Note (Addendum)
 Well managed on insulin  pump and CGM. - Continue home insulin  pump - Low threshold to initiate EndoTool if sugars consistently >300  - Consult diabetes coordinator, appreciate recommendations - Consider adding D5 to IVF if patient is to remain NPO for another day

## 2024-05-01 NOTE — Assessment & Plan Note (Addendum)
-   Continue PT/OT as tolerated  - NPO for HIDA scan today - Continue IVF with NS 100mL/hr (hyperkalemia with LR) - Blood cultures NG x2 days - Continue antibiotics: CTX, Flagyl  - AM CBC, BMP - Pain control: acetaminophen  q6h PRN

## 2024-05-01 NOTE — Progress Notes (Signed)
 "    Subjective/Chief Complaint: Pt with no abd pain this AM HIDA pending   Objective: Vital signs in last 24 hours: Temp:  [97.8 F (36.6 C)-99.2 F (37.3 C)] 97.8 F (36.6 C) (01/07 0709) Pulse Rate:  [77-91] 83 (01/07 0352) Resp:  [7-30] 16 (01/07 0709) BP: (156-172)/(82-96) 168/88 (01/07 0709) SpO2:  [96 %-100 %] 96 % (01/07 0709) Weight:  [84.7 kg-92.1 kg] 84.7 kg (01/06 1609) Last BM Date : 05/01/24  Intake/Output from previous day: 01/06 0701 - 01/07 0700 In: 616.6 [P.O.:360; I.V.:154.9; IV Piggyback:101.7] Out: 1000 [Urine:1000] Intake/Output this shift: Total I/O In: 200 [IV Piggyback:200] Out: -   PE:  Constitutional: No acute distress, conversant, appears states age. Eyes: Anicteric sclerae, moist conjunctiva, no lid lag Lungs: Clear to auscultation bilaterally, normal respiratory effort CV: regular rate and rhythm, no murmurs, no peripheral edema, pedal pulses 2+ GI: Soft, no masses or hepatosplenomegaly, non-tender to palpation to RUQ Skin: No rashes, palpation reveals normal turgor Psychiatric: appropriate judgment and insight, oriented to person, place, and time   Lab Results:  Recent Labs    04/30/24 1236 05/01/24 0223  WBC 5.8 6.4  HGB 9.8* 9.4*  HCT 30.2* 29.0*  PLT 422* 394   BMET Recent Labs    04/30/24 2004 05/01/24 0223  NA 139 142  K 4.8 4.6  CL 112* 115*  CO2 19* 18*  GLUCOSE 157* 100*  BUN 44* 41*  CREATININE 2.46* 2.41*  CALCIUM  9.2 9.0   PT/INR No results for input(s): LABPROT, INR in the last 72 hours. ABG No results for input(s): PHART, HCO3 in the last 72 hours.  Invalid input(s): PCO2, PO2  Studies/Results: ECHOCARDIOGRAM COMPLETE Result Date: 04/30/2024    ECHOCARDIOGRAM REPORT   Patient Name:   ARA MANO Date of Exam: 04/30/2024 Medical Rec #:  [id number]       Height:       69.0 in Accession #:    [id number]      Weight:       203.0 lb Date of Birth:  [birth year]        BSA:          2.079 m Patient  Age:    48 years        BP:           160/88 mmHg Patient Gender: M               HR:           83 bpm. Exam Location:  Inpatient Procedure: 2D Echo, Cardiac Doppler, Color Doppler and Intracardiac            Opacification Agent (Both Spectral and Color Flow Doppler were            utilized during procedure). Indications:    Chest Pain  History:        Patient has prior history of Echocardiogram examinations, most                 recent 11/11/2023. Angina, Previous Myocardial Infarction and                 CAD, Signs/Symptoms:Chest Pain and Edema; Risk Factors:Diabetes.  Sonographer:    Juliene Rucks Referring Phys: ARETA SALIVA  Sonographer Comments: Patient is obese. IMPRESSIONS  1. Left ventricular ejection fraction, by estimation, is >75%. The left ventricle has hyperdynamic function. The left ventricle has no regional wall motion abnormalities. Left ventricular diastolic parameters were normal.  2. Right  ventricular systolic function is hyperdynamic. The right ventricular size is normal.  3. A small pericardial effusion is present. The pericardial effusion is posterior to the left ventricle.  4. The mitral valve is normal in structure. No evidence of mitral valve regurgitation. No evidence of mitral stenosis.  5. The aortic valve is tricuspid. Aortic valve regurgitation is not visualized. No aortic stenosis is present.  6. The inferior vena cava is normal in size with greater than 50% respiratory variability, suggesting right atrial pressure of 3 mmHg. Comparison(s): Prior images reviewed side by side. Function is hyperdynamic. FINDINGS  Left Ventricle: Left ventricular ejection fraction, by estimation, is >75%. The left ventricle has hyperdynamic function. The left ventricle has no regional wall motion abnormalities. Definity  contrast agent was given IV to delineate the left ventricular endocardial borders. The left ventricular internal cavity size was normal in size. There is no left ventricular hypertrophy.  Left ventricular diastolic parameters were normal. Right Ventricle: The right ventricular size is normal. No increase in right ventricular wall thickness. Right ventricular systolic function is hyperdynamic. Left Atrium: Left atrial size was normal in size. Right Atrium: Right atrial size was normal in size. Pericardium: A small pericardial effusion is present. The pericardial effusion is posterior to the left ventricle. Mitral Valve: The mitral valve is normal in structure. No evidence of mitral valve regurgitation. No evidence of mitral valve stenosis. Tricuspid Valve: The tricuspid valve is normal in structure. Tricuspid valve regurgitation is not demonstrated. No evidence of tricuspid stenosis. Aortic Valve: The aortic valve is tricuspid. Aortic valve regurgitation is not visualized. No aortic stenosis is present. Pulmonic Valve: The pulmonic valve was normal in structure. Pulmonic valve regurgitation is not visualized. No evidence of pulmonic stenosis. Aorta: The aortic root and ascending aorta are structurally normal, with no evidence of dilitation. Venous: The inferior vena cava is normal in size with greater than 50% respiratory variability, suggesting right atrial pressure of 3 mmHg. IAS/Shunts: No atrial level shunt detected by color flow Doppler.  LEFT VENTRICLE PLAX 2D LVIDd:         4.40 cm      Diastology LVIDs:         3.20 cm      LV e' medial:    11.50 cm/s LV PW:         1.00 cm      LV E/e' medial:  10.2 LV IVS:        1.20 cm      LV e' lateral:   10.40 cm/s LVOT diam:     1.70 cm      LV E/e' lateral: 11.2 LV SV:         94 LV SV Index:   45 LVOT Area:     2.27 cm  LV Volumes (MOD) LV vol d, MOD A4C: 129.0 ml LV vol s, MOD A4C: 36.5 ml LV SV MOD A4C:     129.0 ml RIGHT VENTRICLE RV Basal diam:  3.40 cm RV Mid diam:    2.50 cm RV S prime:     19.50 cm/s TAPSE (M-mode): 2.2 cm LEFT ATRIUM           Index        RIGHT ATRIUM           Index LA diam:      4.20 cm 2.02 cm/m   RA Area:     15.70 cm  LA Vol (A2C): 67.6 ml 32.51 ml/m  RA Volume:  35.50 ml  17.07 ml/m  AORTIC VALVE LVOT Vmax:   174.00 cm/s LVOT Vmean:  135.000 cm/s LVOT VTI:    0.416 m  AORTA Ao Root diam: 2.80 cm Ao Asc diam:  2.80 cm MITRAL VALVE MV Area (PHT): 5.27 cm     SHUNTS MV Decel Time: 144 msec     Systemic VTI:  0.42 m MV E velocity: 117.00 cm/s  Systemic Diam: 1.70 cm MV A velocity: 103.00 cm/s MV E/A ratio:  1.14 Stanly Leavens MD Electronically signed by Stanly Leavens MD Signature Date/Time: 04/30/2024/5:05:29 PM    Final    US  Abdomen Limited RUQ (LIVER/GB) Result Date: 04/30/2024 EXAM: Right Upper Quadrant Abdominal Ultrasound 04/30/2024 11:51:53 AM TECHNIQUE: Real-time ultrasonography of the right upper quadrant of the abdomen was performed. COMPARISON: US  Abdomen 11/04/2022, US  Abdomen 10/20/2016, Non-contrast CT abdomen and pelvis 04/26/2024. CLINICAL HISTORY: 49 year old male. Abdominal pain. Kidney transplant. FINDINGS: LIVER: Normal echogenicity. No intrahepatic biliary ductal dilatation. No evidence of mass. Hepatopetal flow in the portal vein. BILIARY SYSTEM: Gallbladder wall thickness measures 3.0 mm, borderline. Trace pericholecystic fluid (series 1 image 23) and positive sonographic Murphy sign elicited. No echogenic stones or sludge identified within the gallbladder lumen. The common bile duct measures 4 mm, normal. RIGHT KIDNEY: Right kidney appears to be surgically absent. OTHER: No right upper quadrant ascites. IMPRESSION: 1. Trace pericholecystic fluid with borderline gallbladder wall thickening and a positive sonographic murphy sign reported. No gallstone identified. Differential considerations include acalculous cholecystitis versus edema state/anasarca. Electronically signed by: Helayne Hurst MD 04/30/2024 12:17 PM EST RP Workstation: [id number]    Anti-infectives: Anti-infectives (From admission, onward)    Start     Dose/Rate Route Frequency Ordered Stop   04/30/24 1000  cefTRIAXone   (ROCEPHIN ) 2 g in sodium chloride  0.9 % 100 mL IVPB        2 g 200 mL/hr over 30 Minutes Intravenous Every 24 hours 04/29/24 2228     04/30/24 0500  metroNIDAZOLE  (FLAGYL ) IVPB 500 mg        500 mg 100 mL/hr over 60 Minutes Intravenous Every 12 hours 04/29/24 2228     04/29/24 1715  cefTRIAXone  (ROCEPHIN ) 1 g in sodium chloride  0.9 % 100 mL IVPB        1 g 200 mL/hr over 30 Minutes Intravenous  Once 04/29/24 1709 04/29/24 1944   04/29/24 1715  metroNIDAZOLE  (FLAGYL ) IVPB 500 mg        500 mg 100 mL/hr over 60 Minutes Intravenous  Once 04/29/24 1709 04/29/24 1944       Assessment/Plan: 49 yo male with an extensive medical history including T1DM, CKD, history of kidney-pancreas transplant, and CAD, presenting with nausea and vomiting  -HIDA is pending.  Low suspicion for GB disease.  Will f/u -with numerous comorbidities, would be a poor surgical candidate if HIDA is + -Will f/u HIDA  LOS: 2 days    Lynda Leos 05/01/2024  "

## 2024-05-01 NOTE — Plan of Care (Signed)

## 2024-05-01 NOTE — Assessment & Plan Note (Addendum)
"  Persistent nausea with an episode of vomiting this morning after taking medications.  - Attempted oral Pepcid  and TUMS for heartburn and nausea this AM but this was vomited - IV Phenergan  12.5 mg q6h PRN  - IV Pepcid  20mg  once  - Oral Maalox 30mL once  - Reassess after HIDA, may resolve once diet is advanced "

## 2024-05-01 NOTE — Progress Notes (Addendum)
 "    Daily Progress Note Intern Pager: [phone]  Patient name: Daniel Hill Medical record number: [id number] Date of birth: 19-Aug-1975 Age: 49 y.o. Gender: male  Primary Care Provider: Thurmond Cathlyn LABOR., MD Consultants: [facility] transplant team (s/o), GI, Gen surg,   Pt Overview and Major Events to Date:  01/05: Admitted for pancreatitis of the native pancreas with history of kidney/pancreas transplant 01/07: HIDA scan  Assessment and Plan:  Daniel Hill is a 49yo M w/PMHx of kidney and pancreas transplant admitted for pancreatitis of the native pancreas. Hospital course has been complicated by persistent nausea and no clear source of pancreatitis. Will continue to work up for origin with HIDA scan at this time, ?acalculous cholecystitis. Appreciate GI and general surgery following.  Assessment & Plan Pancreatitis Pancreas transplanted [facility]) Renal transplant recipient - Continue PT/OT as tolerated  - NPO for HIDA scan today - Continue IVF with NS 100mL/hr (hyperkalemia with LR) - Blood cultures NG x2 days - Continue antibiotics: CTX, Flagyl  - AM CBC, BMP - Pain control: acetaminophen  q6h PRN Nausea & vomiting Persistent nausea with an episode of vomiting this morning after taking medications.  - Attempted oral Pepcid  and TUMS for heartburn and nausea this AM but this was vomited - IV Phenergan  12.5 mg q6h PRN  - IV Pepcid  20mg  once  - Oral Maalox 30mL once  - Reassess after HIDA, may resolve once diet is advanced  Stable angina Denies any CP, SOB since admission and does not report worsening quality of angina with exertion.  - Last echo 10/2023 with EF 60-65% and no regional wall abnormalities. 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 Unclear baseline creatinine, gradually improving.  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 Barriers include surgical discussion.   Subjective:  Patient was seen and examined at bedside. He states he is very nauseous this AM and complains of heartburn. Denies vomiting, fevers, abdominal pain, diarrhea.   Objective: Temp:  [97.8 F (36.6 C)-99.2 F (37.3 C)] 97.8 F (36.6 C) (01/07 0709) Pulse Rate:  [77-91] 83 (01/07 0352) Resp:  [7-30] 16 (01/07 0709) BP: (156-172)/(82-96) 168/88 (01/07 0709) SpO2:  [96 %-100 %] 96 % (01/07 0709) Weight:  [84.7 kg-92.1 kg] 84.7 kg (01/06 1609) Physical Exam: General: lying comfortably in hospital bed in no acute distress Cardiovascular: RRR, no M/R/G Respiratory: Normal work of breathing on room air, CTAB, no W/R/R Abdomen: soft, mild LLQ TTP, no distention, BS present Extremities: No peripheral edema, moves all extremities equally  Laboratory: Most recent CBC Lab Results  Component Value Date   WBC 6.4 05/01/2024   HGB  9.4 (L) 05/01/2024   HCT 29.0 (L) 05/01/2024   MCV 90.9 05/01/2024   PLT 394 05/01/2024   Most recent BMP    Latest Ref Rng & Units 05/01/2024    2:23 AM  BMP  Glucose 70 - 99 mg/dL 899   BUN 6 - 20 mg/dL 41   Creatinine 9.38 - 1.24 mg/dL 7.58   Sodium 864 - 854  mmol/L 142   Potassium 3.5 - 5.1 mmol/L 4.6   Chloride 98 - 111 mmol/L 115   CO2 22 - 32 mmol/L 18   Calcium  8.9 - 10.3 mg/dL 9.0    Imaging/Diagnostic Tests:  RUQ US  01/06 Impression:  1. Trace pericholecystic fluid with borderline gallbladder wall thickening and a positive sonographic murphy sign reported. No gallstone identified. Differential considerations include acalculous cholecystitis versus edema state/anasarca.  Echocardiogram 01/06 Impression:  Left Ventricle: Left ventricular ejection fraction, by estimation, is  >75%. The left ventricle has hyperdynamic function. The left ventricle has  no regional wall motion abnormalities. Definity  contrast agent was given  IV to delineate the left  ventricular endocardial borders. The left ventricular internal cavity size  was normal in size. There is no left ventricular hypertrophy. Left  ventricular diastolic parameters were normal.   Lupie Credit, DO 05/01/2024, 7:19 AM  PGY-1, [facility] FPTS Intern pager: [phone], text pages welcome Secure chat group [facility] Teaching Service   "

## 2024-05-01 NOTE — Progress Notes (Signed)
 OT Cancellation Note  Patient Details Name: Daniel Hill MRN: [id number] DOB: 1975-11-13   Cancelled Treatment:    Reason Eval/Treat Not Completed: OT screened, no needs identified, will sign off. Per PT, pt ind with ADLs. No OT related concerns for d/c. OT is signing off on this pt.   Giovannina Mun C, OT  Acute Rehabilitation Services Office [phone] Secure chat preferred   Adrianne GORMAN Savers 05/01/2024, 7:11 AM

## 2024-05-01 NOTE — Assessment & Plan Note (Addendum)
 CKD stage 3a. Unclear baseline creatinine, gradually improving.  - Nephrology consulted in ED   - Tx for pyelonephritis not indicated at this time as UA does not show evidence of infection  - Consider reconsult if patient clinically worsens

## 2024-05-01 NOTE — Progress Notes (Signed)
 Patient's blood pressure has been a bit high throughout this shift with highest reading being 169/89 (113). Patient is not symptomatic. Did page and speak with FMTS. No new orders at this time.

## 2024-05-02 ENCOUNTER — Inpatient Hospital Stay (HOSPITAL_COMMUNITY)

## 2024-05-02 ENCOUNTER — Encounter (HOSPITAL_COMMUNITY): Payer: Self-pay | Admitting: Family Medicine

## 2024-05-02 ENCOUNTER — Encounter (HOSPITAL_COMMUNITY)
Admission: EM | Disposition: A | Discharge status: Home / Self Care | Payer: Self-pay | Source: Home / Self Care | Attending: Family Medicine

## 2024-05-02 DIAGNOSIS — K2950 Unspecified chronic gastritis without bleeding: Secondary | ICD-10-CM | POA: Diagnosis not present

## 2024-05-02 DIAGNOSIS — R112 Nausea with vomiting, unspecified: Secondary | ICD-10-CM | POA: Diagnosis not present

## 2024-05-02 DIAGNOSIS — G4733 Obstructive sleep apnea (adult) (pediatric): Secondary | ICD-10-CM

## 2024-05-02 DIAGNOSIS — F418 Other specified anxiety disorders: Secondary | ICD-10-CM | POA: Diagnosis not present

## 2024-05-02 DIAGNOSIS — I2510 Atherosclerotic heart disease of native coronary artery without angina pectoris: Secondary | ICD-10-CM

## 2024-05-02 DIAGNOSIS — K8592 Acute pancreatitis with infected necrosis, unspecified: Secondary | ICD-10-CM

## 2024-05-02 DIAGNOSIS — K3189 Other diseases of stomach and duodenum: Secondary | ICD-10-CM | POA: Diagnosis not present

## 2024-05-02 DIAGNOSIS — R188 Other ascites: Secondary | ICD-10-CM | POA: Diagnosis not present

## 2024-05-02 HISTORY — PX: ESOPHAGOGASTRODUODENOSCOPY: SHX5428

## 2024-05-02 LAB — BASIC METABOLIC PANEL WITH GFR
Anion gap: 8 (ref 5–15)
BUN: 38 mg/dL — ABNORMAL HIGH (ref 6–20)
CO2: 18 mmol/L — ABNORMAL LOW (ref 22–32)
Calcium: 8.8 mg/dL — ABNORMAL LOW (ref 8.9–10.3)
Chloride: 115 mmol/L — ABNORMAL HIGH (ref 98–111)
Creatinine, Ser: 2.56 mg/dL — ABNORMAL HIGH (ref 0.61–1.24)
GFR, Estimated: 30 mL/min — ABNORMAL LOW
Glucose, Bld: 164 mg/dL — ABNORMAL HIGH (ref 70–99)
Potassium: 4.6 mmol/L (ref 3.5–5.1)
Sodium: 141 mmol/L (ref 135–145)

## 2024-05-02 LAB — CBC WITH DIFFERENTIAL/PLATELET
Abs Immature Granulocytes: 0.01 K/uL (ref 0.00–0.07)
Basophils Absolute: 0.1 K/uL (ref 0.0–0.1)
Basophils Relative: 1 %
Eosinophils Absolute: 0.4 K/uL (ref 0.0–0.5)
Eosinophils Relative: 6 %
HCT: 28.1 % — ABNORMAL LOW (ref 39.0–52.0)
Hemoglobin: 9.2 g/dL — ABNORMAL LOW (ref 13.0–17.0)
Immature Granulocytes: 0 %
Lymphocytes Relative: 41 %
Lymphs Abs: 3 K/uL (ref 0.7–4.0)
MCH: 30.3 pg (ref 26.0–34.0)
MCHC: 32.7 g/dL (ref 30.0–36.0)
MCV: 92.4 fL (ref 80.0–100.0)
Monocytes Absolute: 0.9 K/uL (ref 0.1–1.0)
Monocytes Relative: 13 %
Neutro Abs: 2.8 K/uL (ref 1.7–7.7)
Neutrophils Relative %: 39 %
Platelets: 377 K/uL (ref 150–400)
RBC: 3.04 MIL/uL — ABNORMAL LOW (ref 4.22–5.81)
RDW: 13.2 % (ref 11.5–15.5)
WBC: 7.2 K/uL (ref 4.0–10.5)
nRBC: 0 % (ref 0.0–0.2)

## 2024-05-02 LAB — GLUCOSE, CAPILLARY
Glucose-Capillary: 127 mg/dL — ABNORMAL HIGH (ref 70–99)
Glucose-Capillary: 129 mg/dL — ABNORMAL HIGH (ref 70–99)
Glucose-Capillary: 143 mg/dL — ABNORMAL HIGH (ref 70–99)
Glucose-Capillary: 145 mg/dL — ABNORMAL HIGH (ref 70–99)
Glucose-Capillary: 158 mg/dL — ABNORMAL HIGH (ref 70–99)
Glucose-Capillary: 175 mg/dL — ABNORMAL HIGH (ref 70–99)
Glucose-Capillary: 178 mg/dL — ABNORMAL HIGH (ref 70–99)
Glucose-Capillary: 185 mg/dL — ABNORMAL HIGH (ref 70–99)

## 2024-05-02 LAB — TACROLIMUS LEVEL: Tacrolimus (FK506) - LabCorp: 9.1 ng/mL (ref 5.0–20.0)

## 2024-05-02 LAB — PANCREATIC ELASTASE, FECAL: Pancreatic Elastase-1, Stool: 101 ug Elast./g — ABNORMAL LOW

## 2024-05-02 MED ORDER — PANCRELIPASE (LIP-PROT-AMYL) 12000-38000 UNITS PO CPEP
12000.0000 [IU] | ORAL_CAPSULE | Freq: Three times a day (TID) | ORAL | Status: DC
  Administered 2024-05-03 (×2): 12000 [IU] via ORAL
  Filled 2024-05-02 (×2): qty 1

## 2024-05-02 MED ORDER — DICYCLOMINE HCL 10 MG PO CAPS
10.0000 mg | ORAL_CAPSULE | Freq: Three times a day (TID) | ORAL | Status: DC | PRN
  Administered 2024-05-02 – 2024-05-03 (×2): 10 mg via ORAL
  Filled 2024-05-02 (×4): qty 1

## 2024-05-02 MED ORDER — LIDOCAINE 20MG/ML (2%) 15 ML SYRINGE OPTIME
INTRAMUSCULAR | Status: DC | PRN
  Administered 2024-05-02: 200 mg via INTRAVENOUS

## 2024-05-02 MED ORDER — PROPOFOL 10 MG/ML IV BOLUS
INTRAVENOUS | Status: DC | PRN
  Administered 2024-05-02 (×2): 30 mg via INTRAVENOUS
  Administered 2024-05-02: 50 mg via INTRAVENOUS
  Administered 2024-05-02: 70 mg via INTRAVENOUS
  Administered 2024-05-02 (×3): 50 mg via INTRAVENOUS
  Administered 2024-05-02: 40 mg via INTRAVENOUS
  Administered 2024-05-02 (×2): 30 mg via INTRAVENOUS

## 2024-05-02 MED ORDER — LIDOCAINE VISCOUS HCL 2 % MT SOLN
OROMUCOSAL | Status: AC
  Filled 2024-05-02: qty 15

## 2024-05-02 MED ORDER — FAMOTIDINE IN NACL 20-0.9 MG/50ML-% IV SOLN
20.0000 mg | Freq: Once | INTRAVENOUS | Status: AC
  Administered 2024-05-02: 20 mg via INTRAVENOUS
  Filled 2024-05-02: qty 50

## 2024-05-02 MED ORDER — SODIUM CHLORIDE 0.9 % IV SOLN: INTRAVENOUS | Status: DC

## 2024-05-02 NOTE — Assessment & Plan Note (Addendum)
 Nausea persists. Patient states IV phenergan  and Pepcid  worked very well together for him. -General Surgery consulted, appreciate recommendations - No plans for surgery at this time; can be seen outpatient for elective lap chole if so desired  -GI consulted, appreciate recommendations  - NPO for EGD this AM - no need to be off Effient  for this procedure - HIDA scan 01/07 - indicative of biliary diskinesia - Continue IVF with NS 100mL/hr (hyperkalemia with LR) - Blood cultures NG x3 days - Discontinue antibiotics at this time since there is no indication for them - AM CBC, BMP - Continue PT/OT as tolerated  - Pain control: acetaminophen  q6h PRN - IV Phenergan  12.5 mg q6h PRN, transition to home oral Phenergan  as able - IV Pepcid  20mg  once  -Consider Bentyl  for biliary dyskinesia/nausea -P.o. challenge with advance diet post EGD -Considered brain imaging for increased intracranial pressure or another etiology for persistent nausea, however not indicated at this time as patient continues to demonstrate normal gait, he has no positional nausea it is persistent, the nausea is not just in the mornings but round-the-clock, all of which is less likely indicative of brain pathology

## 2024-05-02 NOTE — Assessment & Plan Note (Deleted)
-  General Surgery consulted, appreciate recommendations - No plans for surgery at this time; can be seen outpatient for elective lap chole if so desired  -GI consulted, appreciate recommendations  - NPO for EGD this AM - no need to be off Effient  for this procedure - HIDA scan 01/07 - indicative of biliary diskinesia - Continue IVF with NS 138mL/hr (hyperkalemia with LR) - Blood cultures NG x3 days - Discontinue antibiotics at this time since there is no indication for them - AM CBC, BMP - Continue PT/OT as tolerated  - Pain control: acetaminophen  q6h PRN

## 2024-05-02 NOTE — Assessment & Plan Note (Addendum)
 Well managed on insulin  pump and CGM.  To monitor. - Continue home insulin  pump - Low threshold to initiate EndoTool if sugars consistently >300  - Consult diabetes coordinator, appreciate recommendations

## 2024-05-02 NOTE — Plan of Care (Signed)
 FMTS Interim Progress Note  S: Patient was seen and examined at bedside after EGD today.  He is in good spirits, had finished some chicken broth, juice and a popsicle.  He does not have any complaints at this time and is appreciative of the care that he has received here.  O: BP (!) 165/90 (BP Location: Right Arm)   Pulse 78   Temp 98.2 F (36.8 C) (Oral)   Resp 17   Ht 5' 9 (1.753 m)   Wt 84.7 kg   SpO2 98%   BMI 27.58 kg/m   General: A&O, NAD HEENT: No sign of trauma, EOM grossly intact Respiratory: normal WOB GI: non-distended  Extremities: no peripheral edema. Neuro: Normal gait, moves all four extremities appropriately Skin: no lesions/rashes visualized Psych: Appropriate mood and affect  A/P: Patient feeling much better today and is in good spirits about diagnosis and discharge plan.  Plan as per progress note from today including the following: - Advance diet as tolerated - Continue Bentyl  for biliary dyskinesia and diarrhea - Patient is agreeable to try Creon  at this time for pancreatic insufficiency as well as possible relief in nausea/vomiting - Close follow-up with PCP and GI on discharge; discuss TCA use for cyclical vomiting syndrome  Lupie Credit, DO 05/02/2024, 6:46 PM PGY-1, [facility] Service pager [phone]

## 2024-05-02 NOTE — TOC Initial Note (Signed)
 Transition of Care [facility]) - Initial/Assessment Note    Patient Details  Name: Daniel Hill MRN: [id number] Date of Birth: Nov 10, 1975  [facility]) CM/SW Contact:    Roxie KANDICE Stain, RN Phone Number: 05/02/2024, 1:00 PM  Clinical Narrative:                  [name] at bedside regarding transition needs. Patient lives at home with wife.  PCP confirmed.  ICM (Inpatient Care Management) will continue to follow, no needs anticipated at this time.  Expected Discharge Plan: Home/Self Care Barriers to Discharge: Continued Medical Work up   Patient Goals and CMS Choice Patient states their goals for this hospitalization and ongoing recovery are:: return home          Expected Discharge Plan and Services   Discharge Planning Services: CM Consult   Living arrangements for the past 2 months: Single Family Home                                      Prior Living Arrangements/Services Living arrangements for the past 2 months: Single Family Home Lives with:: Spouse Patient language and need for interpreter reviewed:: Yes Do you feel safe going back to the place where you live?: Yes      Need for Family Participation in Patient Care: Yes (Comment) Care giver support system in place?: Yes (comment)   Criminal Activity/Legal Involvement Pertinent to Current Situation/Hospitalization: No - Comment as needed  Activities of Daily Living   ADL Screening (condition at time of admission) Independently performs ADLs?: Yes (appropriate for developmental age) Is the patient deaf or have difficulty hearing?: No Does the patient have difficulty seeing, even when wearing glasses/contacts?: No Does the patient have difficulty concentrating, remembering, or making decisions?: No  Permission Sought/Granted                  Emotional Assessment Appearance:: Appears stated age Attitude/Demeanor/Rapport: Engaged Affect (typically observed):  Accepting Orientation: : Oriented to Self, Oriented to Place, Oriented to  Time, Oriented to Situation Alcohol / Substance Use: Not Applicable Psych Involvement: No (comment)  Admission diagnosis:  Pancreatitis [K85.90] Coronary artery disease of native artery of native heart with stable angina pectoris [I25.118] Acute pancreatitis with infected necrosis, unspecified pancreatitis type [K85.92] Hyperlipidemia LDL goal <55 [E78.5] Patient Active Problem List   Diagnosis Date Noted   Gastrointestinal tract imaging abnormality 05/01/2024   Immunocompromised 04/30/2024   Stable angina 04/30/2024   Pancreatitis 04/29/2024   Pancreas transplanted (HCC) 04/29/2024   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) 04/29/2024   Chronic health problem 04/29/2024   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 (HCC) 04/29/2024   Pyelonephritis 03/03/2024   Coronary artery disease of native heart with stable angina pectoris 11/15/2023   NSTEMI (non-ST elevated myocardial infarction) (HCC) 11/11/2023   Chest pain 11/11/2023   AKI (acute kidney injury) 11/11/2023   Hyperkalemia 11/11/2023   History of pancreas transplant (HCC) 11/11/2023   Insulin  dependent type 1 diabetes mellitus (HCC) 11/11/2023   GAD (generalized anxiety disorder) 11/11/2023   GERD (gastroesophageal reflux disease) 11/11/2023   Pulmonary edema 11/11/2023   Elevated blood pressure reading 11/11/2023   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 02/03/2022   OSA on CPAP 01/14/2021   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   Pneumonia due to COVID-19 virus 07/28/2018   Refractory nausea and vomiting 07/28/2018  Hypothyroidism due to Hashimoto's thyroiditis 03/21/2018   CKD (chronic kidney disease) stage 3, GFR 30-59 ml/min (HCC) 09/20/2017   Mild episode of recurrent major depressive disorder 10/22/2015   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 02/05/2015   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   Renal  transplant recipient 05/20/2011   Obesity 05/20/2011   PCP:  Thurmond Cathlyn LABOR., MD Pharmacy:   [facility] [phone] - [place], [place] - [address] AT [facility] OF [address] & [address] [place] [address] Phone: [phone] Fax: [phone]  [facility] - [place], [place] - [facility] AT Portal to Registered [facility] Sites [address] [place] [address] Phone: [phone] Fax: [phone]  [facility] - [address] [place] - [address] [place] [address] Phone: [phone] Fax: [phone]  [facility] [address] [place] [address] Phone: [phone] Fax: [phone]     Social Drivers of Health (SDOH) Social History: SDOH Screenings   Food Insecurity: No Food Insecurity (04/30/2024)  Housing: Low Risk (04/30/2024)  Transportation Needs: No Transportation Needs (04/30/2024)  Utilities: Not At Risk (04/30/2024)  Tobacco Use: Low Risk (05/02/2024)   SDOH Interventions:     Readmission Risk Interventions     No data to display

## 2024-05-02 NOTE — Transfer of Care (Signed)
 Immediate Anesthesia Transfer of Care Note  Patient: Daniel Hill  Procedure(s) Performed: EGD (ESOPHAGOGASTRODUODENOSCOPY)  Patient Location: PACU  Anesthesia Type:MAC  Level of Consciousness: awake, alert , and oriented  Airway & Oxygen Therapy: Patient Spontanous Breathing  Post-op Assessment: Report given to RN and Post -op Vital signs reviewed and stable  Post vital signs: Reviewed and stable  Last Vitals:  Vitals Value Taken Time  BP    Temp    Pulse    Resp    SpO2      Last Pain:  Vitals:   05/02/24 1214  TempSrc: Temporal  PainSc: 0-No pain         Complications: There were no known notable events for this encounter.

## 2024-05-02 NOTE — Progress Notes (Addendum)
 "    Daily Progress Note Intern Pager: [phone]  Patient name: Daniel Hill Medical record number: [id number] Date of birth: June 18, 1975 Age: 49 y.o. Gender: male  Primary Care Provider: Thurmond Cathlyn LABOR., MD Consultants: [facility] transplant team (s/o), GI, gen surg  Code Status: Full  Pt Overview and Major Events to Date:  01/05: Admitted for pancreatitis of the native pancreas with history of kidney/pancreas transplant 01/07: HIDA scan without evidence of acute cholecystitis, with decreased EF  Assessment and Plan:  [name] is a 49yo M w/PMHx of kidney and pancreas transplant admitted for pancreatitis of the native pancreas. Hospital course has been complicated by persistent nausea and no clear source of pancreatitis. HIDA with evidence of biliary dyskinesia. Appreciate GI and general surgery following.  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 Patient states IV phenergan  and Pepcid  worked very well together for him. -General Surgery consulted, appreciate recommendations - No plans for surgery at this time; can be seen outpatient for elective lap chole if so desired  -GI consulted, appreciate recommendations  - NPO for EGD this AM - no need to be off Effient  for this procedure - HIDA scan 01/07 - indicative of biliary diskinesia - Continue IVF with NS 100mL/hr (hyperkalemia with LR) - Blood cultures NG x3 days - Discontinue antibiotics at this time since there is no indication for them - AM CBC, BMP - Continue PT/OT as tolerated  - Pain control: acetaminophen  q6h PRN - IV Phenergan  12.5 mg q6h PRN, transition to home oral Phenergan  as able - IV Pepcid  20mg  once  -Consider Bentyl  for biliary dyskinesia/nausea -P.o. 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  To monitor. 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  Stable. - Nephrology consulted in ED   - Tx for pyelonephritis not indicated at this time as UA does not show evidence of infection  - Consider reconsult if patient clinically worsens Stable angina Denies any CP, SOB since admission and does not report worsening quality of angina with exertion.  - Last echo 10/2023 with EF 60-65% and no regional wall abnormalities. 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 for EGD, advance as tolerated to regular diet PPx: Effient  Dispo:Home pending clinical improvement . Subjective:  Patient was seen and examined at bedside.  He is sitting up in his recliner today.  States he is so nauseous he may not be able to make it to the EGD this morning without vomiting.  Denies abdominal pain, any episodes of vomiting this morning, dysuria, diarrhea, or any other complaints at this time.  Objective: Temp:  [97.8 F (36.6 C)-98.1 F (36.7 C)] 97.9  F (36.6 C) (01/08 0440) Pulse Rate:  [79-84] 79 (01/08 0015) Resp:  [13-19] 19 (01/08 0440) BP: (143-161)/(76-90) 161/90 (01/08 0440) SpO2:  [96 %-100 %] 98 % (01/07 1853) Physical Exam: General: Sitting up comfortably in  hospital recliner, in no acute distress Abdomen: no distention Extremities: No peripheral edema, moves all extremities equally  Laboratory: Most recent CBC Lab Results  Component Value Date   WBC 7.2 05/02/2024   HGB 9.2 (L) 05/02/2024   HCT 28.1 (L) 05/02/2024   MCV 92.4 05/02/2024   PLT 377 05/02/2024   Most recent BMP    Latest Ref Rng & Units 05/02/2024    1:59 AM  BMP  Glucose 70 - 99 mg/dL 835   BUN 6 - 20 mg/dL 38   Creatinine 9.38 - 1.24 mg/dL 7.43   Sodium 864 - 854 mmol/L 141   Potassium 3.5 - 5.1 mmol/L 4.6   Chloride 98 - 111 mmol/L 115   CO2 22 - 32 mmol/L 18   Calcium  8.9 - 10.3 mg/dL 8.8    Imaging/Diagnostic Tests: HIDA scan 01/07 1. No evidence of acute cholecystitis. 2. Decreased gallbladder ejection fraction of 26%, consistent with biliary dyskinesia.  Lupie Credit, DO 05/02/2024, 7:17 AM  PGY-1, [facility] FPTS Intern pager: [phone], text pages welcome Secure chat group [facility] Teaching Service   "

## 2024-05-02 NOTE — Anesthesia Preprocedure Evaluation (Signed)
"                                    Anesthesia Evaluation  Patient identified by MRN, date of birth, ID band Patient awake    Reviewed: Allergy & Precautions, NPO status , Patient's Chart, lab work & pertinent test results  History of Anesthesia Complications Negative for: history of anesthetic complications  Airway Mallampati: II  TM Distance: >3 FB Neck ROM: Full    Dental no notable dental hx. (+) Teeth Intact   Pulmonary sleep apnea , neg COPD, Patient abstained from smoking.Not current smoker   Pulmonary exam normal breath sounds clear to auscultation       Cardiovascular Exercise Tolerance: Good METS(-) hypertension+ CAD and + Past MI  (-) dysrhythmias  Rhythm:Regular Rate:Normal - Systolic murmurs    Neuro/Psych  PSYCHIATRIC DISORDERS Anxiety Depression    negative neurological ROS     GI/Hepatic ,GERD  ,,(+)     (-) substance abuse  Persistent nausea and vomiting. No vomiting since yesterday (it was frothy foam that came up). No solid food for past 4 days, only liquids. Denies N/V today   Endo/Other  diabetes, Well Controlled, Type 1, Insulin  DependentHypothyroidism    Renal/GU Renal diseaseHx kidney pancreas transplant     Musculoskeletal   Abdominal   Peds  Hematology   Anesthesia Other Findings Past Medical History: No date: Anxiety No date: Depression No date: Diabetes mellitus No date: GERD (gastroesophageal reflux disease) No date: Hypothyroidism No date: Kidney replaced by transplant No date: Pancreas transplanted (HCC) No date: Thyroid  disease  Reproductive/Obstetrics                              Anesthesia Physical Anesthesia Plan  ASA: 3  Anesthesia Plan: MAC   Post-op Pain Management: Minimal or no pain anticipated   Induction: Intravenous  PONV Risk Score and Plan: 1 and Propofol  infusion, TIVA and Ondansetron   Airway Management Planned: Nasal Cannula  Additional Equipment:  None  Intra-op Plan:   Post-operative Plan:   Informed Consent: I have reviewed the patients History and Physical, chart, labs and discussed the procedure including the risks, benefits and alternatives for the proposed anesthesia with the patient or authorized representative who has indicated his/her understanding and acceptance.     Dental advisory given  Plan Discussed with: CRNA and Surgeon  Anesthesia Plan Comments: (Discussed risks of anesthesia with patient, including possibility of difficulty with spontaneous ventilation under anesthesia necessitating airway intervention, aspiration pneumonia, PONV, and rare risks such as cardiac or respiratory or neurological events, and allergic reactions. Reasonable to proceed natural airway given no N/V today and liquid diet for past 4 days, will keep close eye for any secretions or emesis. Discussed the role of CRNA in patient's perioperative care. Patient understands.)        Anesthesia Quick Evaluation  "

## 2024-05-02 NOTE — Plan of Care (Signed)
" °  Problem: Clinical Measurements: Goal: Ability to maintain clinical measurements within normal limits will improve Outcome: Progressing Goal: Diagnostic test results will improve Outcome: Progressing   Problem: Activity: Goal: Risk for activity intolerance will decrease Outcome: Progressing   Problem: Nutrition: Goal: Adequate nutrition will be maintained Outcome: Progressing   Problem: Coping: Goal: Level of anxiety will decrease Outcome: Progressing   Problem: Elimination: Goal: Will not experience complications related to bowel motility Outcome: Progressing Goal: Will not experience complications related to urinary retention Outcome: Progressing   "

## 2024-05-02 NOTE — Assessment & Plan Note (Addendum)
 Denies any CP, SOB since admission and does not report worsening quality of angina with exertion.  - Last echo 10/2023 with EF 60-65% and no regional wall abnormalities. - Echo 01/06: >75% EF, hyperdynamic  - Most likely due to elevated BP as well as pancreatitis  - Continue to monitor, continue home amlodipine , Imdur  and metoprolol 

## 2024-05-02 NOTE — Progress Notes (Signed)
 "    Subjective/Chief Complaint: Pt with no abd pain this AM HIDA reviewed   Objective: Vital signs in last 24 hours: Temp:  [97.8 F (36.6 C)-98.2 F (36.8 C)] 98.2 F (36.8 C) (01/08 0757) Pulse Rate:  [79-84] 79 (01/08 0757) Resp:  [13-19] 17 (01/08 0757) BP: (143-161)/(76-92) 154/92 (01/08 0757) SpO2:  [96 %-100 %] 98 % (01/07 1853) Last BM Date : 05/01/24  Intake/Output from previous day: 01/07 0701 - 01/08 0700 In: 1114.5 [P.O.:240; I.V.:488.2; IV Piggyback:386.3] Out: 850 [Urine:800; Emesis/NG output:50] Intake/Output this shift: No intake/output data recorded.  PE:  Constitutional: No acute distress, conversant, appears states age. Eyes: Anicteric sclerae, moist conjunctiva, no lid lag Lungs: Clear to auscultation bilaterally, normal respiratory effort CV: regular rate and rhythm, no murmurs, no peripheral edema, pedal pulses 2+ GI: Soft, no masses or hepatosplenomegaly, non-tender to palpation Skin: No rashes, palpation reveals normal turgor Psychiatric: appropriate judgment and insight, oriented to person, place, and time   Lab Results:  Recent Labs    05/01/24 0223 05/02/24 0159  WBC 6.4 7.2  HGB 9.4* 9.2*  HCT 29.0* 28.1*  PLT 394 377   BMET Recent Labs    05/01/24 0223 05/02/24 0159  NA 142 141  K 4.6 4.6  CL 115* 115*  CO2 18* 18*  GLUCOSE 100* 164*  BUN 41* 38*  CREATININE 2.41* 2.56*  CALCIUM  9.0 8.8*   PT/INR No results for input(s): LABPROT, INR in the last 72 hours. ABG No results for input(s): PHART, HCO3 in the last 72 hours.  Invalid input(s): PCO2, PO2  Studies/Results: NM Hepato W/EF Result Date: 05/01/2024 CLINICAL DATA:  Nausea, vomiting and abnormal gallbladder on right upper quadrant ultrasound. EXAM: NUCLEAR MEDICINE HEPATOBILIARY IMAGING WITH GALLBLADDER EF TECHNIQUE: Sequential images of the abdomen were obtained out to 60 minutes following intravenous administration of radiopharmaceutical. After oral  ingestion of 8 ounces of Half-and-Half cream, gallbladder ejection fraction was determined. RADIOPHARMACEUTICALS:  5.2 mCi Tc-40m Choletec  IV COMPARISON:  None Available. FINDINGS: Prompt uptake and biliary excretion of activity by the liver is seen. Gallbladder activity is visualized, consistent with patency of cystic duct. Biliary activity passes into small bowel, consistent with patent common bile duct. Calculated gallbladder ejection fraction is 26%. (Normal gallbladder ejection fraction with half-and-half is greater than 33% and less than 80%.) IMPRESSION: 1. No evidence of acute cholecystitis. 2. Decreased gallbladder ejection fraction of 26%, consistent with biliary dyskinesia. Electronically Signed   By: Suzen Dials M.D.   On: 05/01/2024 20:16   ECHOCARDIOGRAM COMPLETE Result Date: 04/30/2024    ECHOCARDIOGRAM REPORT   Patient Name:   Daniel Hill Date of Exam: 04/30/2024 Medical Rec #:  [id number]       Height:       69.0 in Accession #:    [id number]      Weight:       203.0 lb Date of Birth:  [birth year]        BSA:          2.079 m Patient Age:    49 years        BP:           160/88 mmHg Patient Gender: M               HR:           83 bpm. Exam Location:  Inpatient Procedure: 2D Echo, Cardiac Doppler, Color Doppler and Intracardiac  Opacification Agent (Both Spectral and Color Flow Doppler were            utilized during procedure). Indications:    Chest Pain  History:        Patient has prior history of Echocardiogram examinations, most                 recent 11/11/2023. Angina, Previous Myocardial Infarction and                 CAD, Signs/Symptoms:Chest Pain and Edema; Risk Factors:Diabetes.  Sonographer:    Juliene Rucks Referring Phys: ARETA SALIVA  Sonographer Comments: Patient is obese. IMPRESSIONS  1. Left ventricular ejection fraction, by estimation, is >75%. The left ventricle has hyperdynamic function. The left ventricle has no regional wall motion abnormalities. Left  ventricular diastolic parameters were normal.  2. Right ventricular systolic function is hyperdynamic. The right ventricular size is normal.  3. A small pericardial effusion is present. The pericardial effusion is posterior to the left ventricle.  4. The mitral valve is normal in structure. No evidence of mitral valve regurgitation. No evidence of mitral stenosis.  5. The aortic valve is tricuspid. Aortic valve regurgitation is not visualized. No aortic stenosis is present.  6. The inferior vena cava is normal in size with greater than 50% respiratory variability, suggesting right atrial pressure of 3 mmHg. Comparison(s): Prior images reviewed side by side. Function is hyperdynamic. FINDINGS  Left Ventricle: Left ventricular ejection fraction, by estimation, is >75%. The left ventricle has hyperdynamic function. The left ventricle has no regional wall motion abnormalities. Definity  contrast agent was given IV to delineate the left ventricular endocardial borders. The left ventricular internal cavity size was normal in size. There is no left ventricular hypertrophy. Left ventricular diastolic parameters were normal. Right Ventricle: The right ventricular size is normal. No increase in right ventricular wall thickness. Right ventricular systolic function is hyperdynamic. Left Atrium: Left atrial size was normal in size. Right Atrium: Right atrial size was normal in size. Pericardium: A small pericardial effusion is present. The pericardial effusion is posterior to the left ventricle. Mitral Valve: The mitral valve is normal in structure. No evidence of mitral valve regurgitation. No evidence of mitral valve stenosis. Tricuspid Valve: The tricuspid valve is normal in structure. Tricuspid valve regurgitation is not demonstrated. No evidence of tricuspid stenosis. Aortic Valve: The aortic valve is tricuspid. Aortic valve regurgitation is not visualized. No aortic stenosis is present. Pulmonic Valve: The pulmonic valve  was normal in structure. Pulmonic valve regurgitation is not visualized. No evidence of pulmonic stenosis. Aorta: The aortic root and ascending aorta are structurally normal, with no evidence of dilitation. Venous: The inferior vena cava is normal in size with greater than 50% respiratory variability, suggesting right atrial pressure of 3 mmHg. IAS/Shunts: No atrial level shunt detected by color flow Doppler.  LEFT VENTRICLE PLAX 2D LVIDd:         4.40 cm      Diastology LVIDs:         3.20 cm      LV e' medial:    11.50 cm/s LV PW:         1.00 cm      LV E/e' medial:  10.2 LV IVS:        1.20 cm      LV e' lateral:   10.40 cm/s LVOT diam:     1.70 cm      LV E/e' lateral: 11.2 LV SV:  94 LV SV Index:   45 LVOT Area:     2.27 cm  LV Volumes (MOD) LV vol d, MOD A4C: 129.0 ml LV vol s, MOD A4C: 36.5 ml LV SV MOD A4C:     129.0 ml RIGHT VENTRICLE RV Basal diam:  3.40 cm RV Mid diam:    2.50 cm RV S prime:     19.50 cm/s TAPSE (M-mode): 2.2 cm LEFT ATRIUM           Index        RIGHT ATRIUM           Index LA diam:      4.20 cm 2.02 cm/m   RA Area:     15.70 cm LA Vol (A2C): 67.6 ml 32.51 ml/m  RA Volume:   35.50 ml  17.07 ml/m  AORTIC VALVE LVOT Vmax:   174.00 cm/s LVOT Vmean:  135.000 cm/s LVOT VTI:    0.416 m  AORTA Ao Root diam: 2.80 cm Ao Asc diam:  2.80 cm MITRAL VALVE MV Area (PHT): 5.27 cm     SHUNTS MV Decel Time: 144 msec     Systemic VTI:  0.42 m MV E velocity: 117.00 cm/s  Systemic Diam: 1.70 cm MV A velocity: 103.00 cm/s MV E/A ratio:  1.14 Stanly Leavens MD Electronically signed by Stanly Leavens MD Signature Date/Time: 04/30/2024/5:05:29 PM    Final    US  Abdomen Limited RUQ (LIVER/GB) Result Date: 04/30/2024 EXAM: Right Upper Quadrant Abdominal Ultrasound 04/30/2024 11:51:53 AM TECHNIQUE: Real-time ultrasonography of the right upper quadrant of the abdomen was performed. COMPARISON: US  Abdomen 11/04/2022, US  Abdomen 10/20/2016, Non-contrast CT abdomen and pelvis 04/26/2024.  CLINICAL HISTORY: 49 year old male. Abdominal pain. Kidney transplant. FINDINGS: LIVER: Normal echogenicity. No intrahepatic biliary ductal dilatation. No evidence of mass. Hepatopetal flow in the portal vein. BILIARY SYSTEM: Gallbladder wall thickness measures 3.0 mm, borderline. Trace pericholecystic fluid (series 1 image 23) and positive sonographic Murphy sign elicited. No echogenic stones or sludge identified within the gallbladder lumen. The common bile duct measures 4 mm, normal. RIGHT KIDNEY: Right kidney appears to be surgically absent. OTHER: No right upper quadrant ascites. IMPRESSION: 1. Trace pericholecystic fluid with borderline gallbladder wall thickening and a positive sonographic murphy sign reported. No gallstone identified. Differential considerations include acalculous cholecystitis versus edema state/anasarca. Electronically signed by: Helayne Hurst MD 04/30/2024 12:17 PM EST RP Workstation: [id number]    Anti-infectives: Anti-infectives (From admission, onward)    Start     Dose/Rate Route Frequency Ordered Stop   04/30/24 1000  cefTRIAXone  (ROCEPHIN ) 2 g in sodium chloride  0.9 % 100 mL IVPB        2 g 200 mL/hr over 30 Minutes Intravenous Every 24 hours 04/29/24 2228     04/30/24 0500  metroNIDAZOLE  (FLAGYL ) IVPB 500 mg        500 mg 100 mL/hr over 60 Minutes Intravenous Every 12 hours 04/29/24 2228     04/29/24 1715  cefTRIAXone  (ROCEPHIN ) 1 g in sodium chloride  0.9 % 100 mL IVPB        1 g 200 mL/hr over 30 Minutes Intravenous  Once 04/29/24 1709 04/29/24 1944   04/29/24 1715  metroNIDAZOLE  (FLAGYL ) IVPB 500 mg        500 mg 100 mL/hr over 60 Minutes Intravenous  Once 04/29/24 1709 04/29/24 1944       Assessment/Plan: 49 yo male with an extensive medical history including T1DM, CKD, history of kidney-pancreas transplant, and CAD, presenting with nausea and vomiting  -  HIDA w/o CCY with marginal EF -NO plans for surgery at this time. -If pt has issues with abd pain and  concurrent n/v can f/u as outpt for elective lap chole -please call with questions  LOS: 3 days    Lynda Leos 05/02/2024  "

## 2024-05-02 NOTE — Anesthesia Postprocedure Evaluation (Signed)
"   Anesthesia Post Note  Patient: Daniel Hill  Procedure(s) Performed: EGD (ESOPHAGOGASTRODUODENOSCOPY)     Patient location during evaluation: Endoscopy Anesthesia Type: General Level of consciousness: awake and alert Pain management: pain level controlled Vital Signs Assessment: post-procedure vital signs reviewed and stable Respiratory status: spontaneous breathing, nonlabored ventilation, respiratory function stable and patient connected to nasal cannula oxygen Cardiovascular status: blood pressure returned to baseline and stable Postop Assessment: no apparent nausea or vomiting Anesthetic complications: no   There were no known notable events for this encounter.  Last Vitals:  Vitals:   05/02/24 1214 05/02/24 1319  BP: (!) 174/92 (!) 126/91  Pulse: 87 78  Resp: 12 (!) 28  Temp: (!) 36.1 C   SpO2: 99% 96%    Last Pain:  Vitals:   05/02/24 1319  TempSrc:   PainSc: 0-No pain                 [name]      "

## 2024-05-02 NOTE — Assessment & Plan Note (Addendum)
 HTN - continue home amlodipine   Kidney/pancreas transplant recipient: continue home Prograf 

## 2024-05-02 NOTE — Assessment & Plan Note (Addendum)
 CKD stage 3a.  Stable. - Nephrology consulted in ED   - Tx for pyelonephritis not indicated at this time as UA does not show evidence of infection  - Consider reconsult if patient clinically worsens

## 2024-05-02 NOTE — Interval H&P Note (Signed)
 History and Physical Interval Note:  05/02/2024 2:11 PM  Daniel Hill  has presented today for surgery, with the diagnosis of nausea and vomiting.  The various methods of treatment have been discussed with the patient and family. After consideration of risks, benefits and other options for treatment, the patient has consented to  Procedures: EGD (ESOPHAGOGASTRODUODENOSCOPY) (N/A) as a surgical intervention.  The patient's history has been reviewed, patient examined, no change in status, stable for surgery.  I have reviewed the patient's chart and labs.  Questions were answered to the patient's satisfaction.     Glendia FORBES Holt

## 2024-05-02 NOTE — Op Note (Signed)
 [facility] Patient Name: Daniel Hill Procedure Date : 05/02/2024 MRN: [id number] Attending MD: Glendia BRAVO. Stacia , MD, [id number] Date of Birth: 1975/12/16 CSN: [id number] Age: 49 Admit Type: Inpatient Procedure:                Upper GI endoscopy Indications:              Nausea with vomiting Providers:                Glendia E. Stacia, MD, Willy Hummer, RN,                            Haskel Chris, Technician Referring MD:              Medicines:                Monitored Anesthesia Care Complications:            No immediate complications. Estimated Blood Loss:     Estimated blood loss was minimal. Procedure:                Pre-Anesthesia Assessment:                           - Prior to the procedure, a History and Physical                            was performed, and patient medications and                            allergies were reviewed. The patient's tolerance of                            previous anesthesia was also reviewed. The risks                            and benefits of the procedure and the sedation                            options and risks were discussed with the patient.                            All questions were answered, and informed consent                            was obtained. Prior Anticoagulants: The patient has                            taken Effient  (prasugrel ), last dose was 1 day                            prior to procedure. ASA Grade Assessment: III - A                            patient with severe systemic disease. After  reviewing the risks and benefits, the patient was                            deemed in satisfactory condition to undergo the                            procedure.                           After obtaining informed consent, the endoscope was                            passed under direct vision. Throughout the                            procedure, the patient's blood  pressure, pulse, and                            oxygen saturations were monitored continuously. The                            [id number]-[id number] ([id number]) Olympus endoscope was introduced                            through the mouth, and advanced to the third part                            of duodenum. The upper GI endoscopy was                            accomplished without difficulty. The patient                            tolerated the procedure well. Scope In: Scope Out: Findings:      The examined portions of the nasopharynx, oropharynx and larynx were       normal.      The examined esophagus was normal.      Diffuse mild mucosal changes characterized by congestion were found in       the gastric body. Biopsies were taken with a cold forceps for histology.       Estimated blood loss was minimal.      Bilious fluid was found in the gastric body.      The exam of the stomach was otherwise normal.      Biopsies were taken with a cold forceps in the gastric antrum for       Helicobacter pylori testing. Estimated blood loss was minimal.      The examined duodenum was normal. Biopsies for histology were taken with       a cold forceps for evaluation of celiac disease. Estimated blood loss       was minimal. Impression:               - The examined portions of the nasopharynx,                            oropharynx and  larynx were normal.                           - Normal esophagus.                           - Congested mucosa in the gastric body. Biopsied.                           - Bilious gastric fluid.                           - Normal examined duodenum. Biopsied.                           - Biopsies were taken with a cold forceps for                            Helicobacter pylori testing.                           - No obvious abnormalities to explain patient's                            symptoms. No ulcers, significant inflammation or                            pyloric stenosis.  Small amount of bilious fluid in                            gastric lumen may indicate bile reflux. Unclear if                            this would be contributing to his symptoms. No                            significant bile reflux noted on HIDA scan yesterdy Moderate Sedation:      N/A Recommendation:           - Return patient to hospital ward for ongoing care.                           - Advance diet as tolerated.                           - Continue present medications.                           - Continue supportive care and to assess for other                            potential sources of chronic nausea/vomiting.                           - Consider treatment for cyclic vomiting syndrome  with a TCA. This can be discussed with his                            outpatient gastroenterologist.                           - Await pathology results. Procedure Code(s):        --- Professional ---                           [phone], Esophagogastroduodenoscopy, flexible,                            transoral; with biopsy, single or multiple Diagnosis Code(s):        --- Professional ---                           K31.89, Other diseases of stomach and duodenum                           R11.2, Nausea with vomiting, unspecified CPT copyright 2022 American [employer] Association. All rights reserved. The codes documented in this report are preliminary and upon coder review may  be revised to meet current compliance requirements. Daniqua Campoy E. Stacia, MD 05/02/2024 1:33:58 PM This report has been signed electronically. Number of Addenda: 0

## 2024-05-03 ENCOUNTER — Encounter (HOSPITAL_COMMUNITY): Payer: Self-pay | Admitting: Gastroenterology

## 2024-05-03 ENCOUNTER — Other Ambulatory Visit (HOSPITAL_COMMUNITY): Payer: Self-pay

## 2024-05-03 DIAGNOSIS — K8689 Other specified diseases of pancreas: Secondary | ICD-10-CM

## 2024-05-03 DIAGNOSIS — R112 Nausea with vomiting, unspecified: Secondary | ICD-10-CM | POA: Diagnosis not present

## 2024-05-03 LAB — BASIC METABOLIC PANEL WITH GFR
Anion gap: 7 (ref 5–15)
BUN: 34 mg/dL — ABNORMAL HIGH (ref 6–20)
CO2: 20 mmol/L — ABNORMAL LOW (ref 22–32)
Calcium: 8.7 mg/dL — ABNORMAL LOW (ref 8.9–10.3)
Chloride: 116 mmol/L — ABNORMAL HIGH (ref 98–111)
Creatinine, Ser: 2.66 mg/dL — ABNORMAL HIGH (ref 0.61–1.24)
GFR, Estimated: 29 mL/min — ABNORMAL LOW
Glucose, Bld: 125 mg/dL — ABNORMAL HIGH (ref 70–99)
Potassium: 4.5 mmol/L (ref 3.5–5.1)
Sodium: 142 mmol/L (ref 135–145)

## 2024-05-03 LAB — CBC
HCT: 27 % — ABNORMAL LOW (ref 39.0–52.0)
Hemoglobin: 9 g/dL — ABNORMAL LOW (ref 13.0–17.0)
MCH: 30.2 pg (ref 26.0–34.0)
MCHC: 33.3 g/dL (ref 30.0–36.0)
MCV: 90.6 fL (ref 80.0–100.0)
Platelets: 369 K/uL (ref 150–400)
RBC: 2.98 MIL/uL — ABNORMAL LOW (ref 4.22–5.81)
RDW: 13.1 % (ref 11.5–15.5)
WBC: 5.4 K/uL (ref 4.0–10.5)
nRBC: 0 % (ref 0.0–0.2)

## 2024-05-03 LAB — GLUCOSE, CAPILLARY
Glucose-Capillary: 112 mg/dL — ABNORMAL HIGH (ref 70–99)
Glucose-Capillary: 182 mg/dL — ABNORMAL HIGH (ref 70–99)

## 2024-05-03 LAB — IGG 4: IgG, Subclass 4: 4 mg/dL (ref 2–96)

## 2024-05-03 MED ORDER — LOPERAMIDE HCL 2 MG PO CAPS
4.0000 mg | ORAL_CAPSULE | Freq: Once | ORAL | 0 refills | Status: DC
  Filled 2024-05-03: qty 2, 1d supply, fill #0

## 2024-05-03 MED ORDER — LOPERAMIDE HCL 2 MG PO CAPS
4.0000 mg | ORAL_CAPSULE | Freq: Once | ORAL | Status: AC
  Administered 2024-05-03: 4 mg via ORAL
  Filled 2024-05-03: qty 2

## 2024-05-03 MED ORDER — PANCRELIPASE (LIP-PROT-AMYL) 12000-38000 UNITS PO CPEP
12000.0000 [IU] | ORAL_CAPSULE | Freq: Three times a day (TID) | ORAL | 0 refills | Status: AC
  Filled 2024-05-03: qty 200, 67d supply, fill #0

## 2024-05-03 MED ORDER — DICYCLOMINE HCL 10 MG PO CAPS
10.0000 mg | ORAL_CAPSULE | Freq: Three times a day (TID) | ORAL | 0 refills | Status: AC | PRN
  Filled 2024-05-03: qty 30, 10d supply, fill #0

## 2024-05-03 NOTE — Discharge Summary (Signed)
 "  Family Medicine Teaching [facility] Discharge Summary  Patient name: Daniel Hill Medical record number: [id number] Date of birth: 1975-05-03 Age: 49 y.o. Gender: male Date of Admission: 04/29/2024  Date of Discharge: 05/03/2024 Admitting Physician: Lauraine Norse, DO  Primary Care Provider: Thurmond Cathlyn LABOR., MD Consultants: [facility] transplant team, GI, general surgery  Indication for Hospitalization: persistent nausea and vomiting   Brief Hospital Course:  Daniel Hill is a 49 y.o.male with a history of T1DM, renal and pancreatic transplant, CKD stage 3b who was admitted to the [facility] Medicine Teaching Service at [facility] for abdominal pain and vomiting suspected to be pancreatitis.   His hospital course is detailed below:  Nausea and vomiting suspect in the setting of pancreatitis Cyclic vomiting syndrome Presented with abdominal pain, nausea, vomiting suspected secondary to pancreatitis.  CT abdomen pelvis from 1/2 concerning for increased soft tissue edema and stranding of left lower quadrant transplant kidney, diffuse pancreatic atrophy of the native pancreas with surrounding infiltration and edema.  Lipase on admission was normal. Alk phos mildly elevated at 127. Troponins flat. UA unremarkable, urine culture showed no growth.  He was given Rocephin  and Flagyl  (04/29/24-05/02/24) and diet was progressed as tolerated. Blood cultures showed no growth. [facility] transplant team was contacted who recommended treatment of pancreatitis without consideration of transplanted pancreas given his transplanted pancreas has greatly atrophied. They also recommended testing of tacrolimus  level as this can cause drug-induced pancreatitis. Tacrolimus  level was within normal limits. RUQ ultrasound was performed for persistent nausea throughout hospital course and was significant for trace pericholecystic fluid with borderline gallbladder wall thickening and positive sonographic murphy sign without any gallstones.  Concern for acalculous cholecystitis versus edema/anasarca. HIDA scan was obtained which was negative for cholecystitis and showed biliary dyskinesia. Patient was started on bentyl  for this. Additionally fecal elastase was borderline insufficient so patient was restarted on creon .  Antibiotics were discontinued on 05/02/2024 given no cholecystitis. Prior to discharge, his symptoms were greatly improved and he was tolerating a regular diet without nausea and vomiting. He did have watery diarrhea however this was thought to be from antibiotics. He was given a dose of imodium  on discharge with expectations for the diarrhea to resolve as the patient resumes a regular diet. GI did not think this was true pancreatitis after full workup and recommended discussing the addition of a TCA to his medication regimen for probable cyclic vomiting syndrome. He was advised to have close follow up with both PCP and GI on discharge.   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 Patient had perinephric stranding on imagining around the kidney. Urine culture with no growth. Within baseline, 2.66 on discharge.   Stable angina  Patient has had stable angina since before his MI in July of 2025. He was prescribed Ranexa  by his cardiologist for persistent stable angina with exertion. He did not tolerate the Ranexa  as it caused nausea and vomiting and stopped taking it 2-3 months ago. The patient did not complain of chest pain at any time during hospitalization, however, with history and complexity of patient history as well as no statin use in at least 2 months, an echocardiogram was obtained and significant for hyperdynamic left ventricle with EF > 75% and small pericardial effusion. No regional wall abnormalities.    Other chronic conditions were medically managed with home medications and formulary alternatives as necessary  PCP Follow-up Recommendations: Follow up with cardiology  regarding stable angina.  Physical therapy recommends cardiac rehab after discharge.  If pt has issues with abd pain and concurrent n/v can f/u as outpt with general surgery for elective lap chole. EGD recommended to consider treatment for cyclic vomiting syndrome with a TCA. This can be discussed with his outpatient gastroenterologist.  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 Neuro: Normal gait, moves all four extremities appropriately Skin: no lesions/rashes visualized Psych: Appropriate mood and affect  Significant Procedures:   Upper endoscopy 05/02/2024 Impression: The examined portions of the nasopharynx, oropharynx, and larynx were normal.  Normal esophagus.  Congested mucosa in the gastric body.  Biopsied.  Bilious gastric fluid.  Normal examined duodenum.  Biopsied.  Biopsies were taken with a cold forceps for Helicobacter pylori testing.  No obvious abnormalities to explain patient's symptoms.  No ulcers, significant inflammation or pyloric stenosis.  Small amounts of bilious fluid in gastric lumen may indicate bile reflux.  Unclear if this would be contributing to his symptoms.  No significant bile reflux noted on HIDA scan yesterday.  Significant Labs and Imaging:  Recent Labs  Lab 05/02/24 0159 05/03/24 0228  WBC 7.2 5.4  HGB 9.2* 9.0*  HCT 28.1*  27.0*  PLT 377 369   Recent Labs  Lab 05/02/24 0159 05/03/24 0228  NA 141 142  K 4.6 4.5  CL 115* 116*  CO2 18* 20*  GLUCOSE 164* 125*  BUN 38* 34*  CREATININE 2.56* 2.66*  CALCIUM  8.8* 8.7*   CT ABDOMEN PELVIS WO CONTRAST 04/26/2024 IMPRESSION: 1. Left lower quadrant transplant kidney without hydronephrosis or focal mass. Increased soft tissue edema and stranding. Correlate for possible infection or inflammation infection or rejection. No loculated collections.2. Increased lymphadenopathy in the external iliac chains bilaterally,retroperitoneum, and celiac axis since the prior study, most likely representing inflammatory or infectious lymph nodes.3. Diffuse pancreatic atrophy of the native pancreas with surrounding infiltration/edema, which may reflect pancreatitis; consider correlation with serum lipase. US  Abdomen Limited RUQ (LIVER/GB) 04/30/2024 IMPRESSION: 1. Trace pericholecystic fluid with borderline gallbladder wall thickening and a positive sonographic murphy sign reported. No gallstone identified. Differential considerations include acalculous cholecystitis versus edema state/anasarca. 3. NM Hepato W/EF 05/01/2024 IMPRESSION: 1. No evidence of acute cholecystitis. 2. Decreased gallbladder ejection fraction of 26%, consistent with biliary dyskinesia.  Discharge Medications:  Allergies as of 05/03/2024       Reactions   Vancomycin Shortness Of Breath   Lipitor [atorvastatin ] Other (See Comments)   Other Nausea And Vomiting   RAW Canteloupe, banana, tomatoes, all raw vegetables  Unless the vegetables have dressing on them. 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   Codeine Nausea And Vomiting   Metoclopramide Hcl Nausea And Vomiting        Medication List     STOP taking  these medications    amoxicillin-clavulanate 500-125 MG tablet Commonly known as: AUGMENTIN   fluticasone  50 MCG/ACT nasal spray Commonly known as: FLONASE    MAGNESIUM  GLYCINATE PLUS  PO   multivitamins ther. w/minerals Tabs tablet   ranolazine  500 MG 12 hr tablet Commonly known as: Ranexa    rosuvastatin  20 MG tablet Commonly known as: CRESTOR        TAKE these medications    amLODipine  10 MG tablet Commonly known as: NORVASC  Take 1 tablet (10 mg total) by mouth daily.   Baqsimi  Two Pack 3 MG/DOSE Powd Generic drug: Glucagon  Place 3 mg into the nose once as needed for up to 1 dose.   Creon  12000-38000 units Cpep capsule Generic drug: lipase/protease/amylase Take 1 capsule (12,000 Units total) by mouth 3 (three) times daily with meals.   dicyclomine  10 MG capsule Commonly known as: BENTYL  Take 1 capsule (10 mg total) by mouth 3 (three) times daily as needed (vomiting, diarrhea).   esomeprazole  40 MG capsule Commonly known as: NEXIUM  Take 40 mg by mouth 2 (two) times daily.   ezetimibe  10 MG tablet Commonly known as: ZETIA  Take 10 mg by mouth daily.   Fiasp  100 UNIT/ML Soln Generic drug: Insulin  Aspart (w/Niacinamide) INJECT UP TO 50 UNITS UNDER THE SKIN VIA INSULIN  PUMP DAILY   furosemide  40 MG tablet Commonly known as: LASIX  Take 40 mg by mouth daily. What changed: Another medication with the same name was removed. Continue taking this medication, and follow the directions you see here.   isosorbide  mononitrate 120 MG 24 hr tablet Commonly known as: IMDUR  Take 1 tablet (120 mg total) by mouth daily.   levothyroxine  75 MCG tablet Commonly known as: SYNTHROID  TAKE 1 TABLET(75 MCG) BY MOUTH DAILY   metoprolol  succinate 50 MG 24 hr tablet Commonly known as: TOPROL -XL Take 1 tablet (50 mg total) by mouth daily. Take with or immediately following a meal.   nitroGLYCERIN  0.4 MG SL tablet Commonly known as: Nitrostat  Place 1 tablet (0.4 mg total) under the tongue every 5 (five) minutes as needed for chest pain.   ondansetron  8 MG tablet Commonly known as: ZOFRAN  Take 8 mg by mouth every 8 (eight) hours as needed.   prasugrel  10 MG Tabs  tablet Commonly known as: EFFIENT  Take 1 tablet (10 mg total) by mouth daily.   promethazine  25 MG tablet Commonly known as: PHENERGAN  Take 25 mg by mouth every 6 (six) hours as needed for nausea.   scopolamine  1 MG/3DAYS Commonly known as: TRANSDERM-SCOP Place 1 patch onto the skin every 3 (three) days.   tacrolimus  1 MG capsule Commonly known as: PROGRAF  Take 1-2 mg by mouth See admin instructions. Take 2 capsules by mouth in the morning and 1 capsule at night   traMADol  50 MG tablet Commonly known as: ULTRAM  Take 50 mg by mouth every 6 (six) hours as needed for pain.   Veltassa  8.4 g packet Generic drug: patiromer  Take 8.4 g by mouth every other day.       Discharge Instructions: Please refer to Patient Instructions section of EMR for full details.  Patient was counseled important signs and symptoms that should prompt return to medical care, changes in medications, dietary instructions, activity restrictions, and follow up appointments.   Lupie Credit, DO 05/03/2024, 5:22 PM PGY-1, [facility]  "

## 2024-05-03 NOTE — Plan of Care (Signed)

## 2024-05-03 NOTE — Progress Notes (Signed)
 He denies no concerns. N/V improved. Still having some loose bowel which he felt is due to his medications. No abdominal pain.   No acute findings on exam.  EGD report from yesterday was reviewed and discussed with him  A/P: Chronic persistent nausea and vomiting ?? gastroparesis vs related to biliary dyskinesia Appreciate Gen Surg's consultation > No surgery planned per their team. Outpatient management recommended EGD was completed with no findings to explain his symptoms Continue Nexium  40 mg PO BID He is on a scopolamine  patch from home, which he is still on, and can continue. Continue Bentyl  to the regimen due to his biliary dyskinesia, with no plan for surgery. If cleared by GI, can be discharged home today. Need outpatient GI follow-up > Patient is aware of this and agreed with the plan.  Pancreatic insufficiency: Low stool elastase-1 of 101 Low Lipase level <10 Creon  initiated Med management with PCP and outpatient GI.  Diarrhea: Still having loose bowel, which could be medication-induced or due to pancreatic insufficiency. No dehydration.  When I mentioned a stool test for C.diff or stool pathogens, he was not interested in this.  Outpatient monitoring recommended   HTN: Review his home antihypertensive regimen and adjust as needed   CKD IIIb: At baseline No concerns for pyelonephritis with neg Urine Cx Outpatient nephrology follow-up recommended

## 2024-05-03 NOTE — Discharge Instructions (Addendum)
 Dear Evalene GORMAN Rumps,  Thank you for letting us  participate in your care. You were hospitalized for nausea, vomiting, abdominal pain thought to initially have pancreatitis; however, with other workup thought to have either biliary dyskinesia (the gallbladder not functioning as efficiently) and/or cyclic vomiting syndrome causing your symptoms. You will be sent home with bentyl  to use as needed in addition to immodium short term to help with diarrhea. Your gastroenterologist will discuss a class of medications called TCAs for cyclic vomiting syndrome.   POST-HOSPITAL & CARE INSTRUCTIONS Follow up with your gastroenterologist  Follow up with your PCP within one week Follow up with cardiology at your appointment listed below.  Go to your follow up appointments (listed below)  DOCTOR'S APPOINTMENT   Future Appointments  Date Time Provider Department Center  06/18/2024  8:20 AM Ladona Heinz, MD [facility]     Take care and be well!  Family Medicine Teaching Service Inpatient Team [place]  [facility]  [address] [place], [place] [address] [phone]

## 2024-05-04 LAB — CULTURE, BLOOD (ROUTINE X 2)
Culture: NO GROWTH
Culture: NO GROWTH
Special Requests: ADEQUATE

## 2024-05-06 LAB — SURGICAL PATHOLOGY

## 2024-05-07 ENCOUNTER — Ambulatory Visit: Payer: Self-pay | Admitting: Gastroenterology

## 2024-05-07 NOTE — Progress Notes (Signed)
 Daniel Hill,  The biopsies of your duodenum were unremarkable.  There is no evidence of celiac disease.  The biopsies taken from your stomach were notable for mild chronic gastritis (inflammation) which is a common finding, but there was no evidence of Helicobacter pylori infection. This common finding is not likely to explain chronic nausea and vomiting and there is no specific treatment or further evaluation recommended for this finding.  As previously discussed, it may be that your chronic nausea and vomiting are related to side effects from one of the many medications that you are taking, or may also be related to a so-called underlying gut-brain axis disorder such as cyclic vomiting syndrome.  Please follow-up with your outpatient gastroenterologist for further management of these chronic symptoms.

## 2024-05-09 ENCOUNTER — Encounter: Payer: Self-pay | Admitting: Cardiology

## 2024-05-13 ENCOUNTER — Encounter: Payer: Self-pay | Admitting: Cardiology

## 2024-05-13 ENCOUNTER — Other Ambulatory Visit: Payer: Self-pay | Admitting: Internal Medicine

## 2024-05-13 ENCOUNTER — Encounter: Payer: Self-pay | Admitting: Internal Medicine

## 2024-05-13 DIAGNOSIS — E10319 Type 1 diabetes mellitus with unspecified diabetic retinopathy without macular edema: Secondary | ICD-10-CM

## 2024-05-13 DIAGNOSIS — I25118 Atherosclerotic heart disease of native coronary artery with other forms of angina pectoris: Secondary | ICD-10-CM

## 2024-05-13 MED ORDER — DEXCOM G6 SENSOR MISC: 1.0000 | 0 refills | Status: AC

## 2024-05-14 ENCOUNTER — Other Ambulatory Visit: Payer: Self-pay | Admitting: Cardiology

## 2024-05-14 MED ORDER — RANOLAZINE ER 500 MG PO TB12: 500.0000 mg | ORAL_TABLET | Freq: Two times a day (BID) | ORAL | 2 refills | Status: AC

## 2024-05-14 NOTE — Telephone Encounter (Signed)
"    ICD-10-CM   1. Coronary artery disease of native artery of native heart with stable angina pectoris  I25.118      Meds ordered this encounter  Medications   ranolazine  (RANEXA ) 500 MG 12 hr tablet    Sig: Take 1 tablet (500 mg total) by mouth 2 (two) times daily.    Dispense:  60 tablet    Refill:  2    "

## 2024-05-20 ENCOUNTER — Ambulatory Visit: Admitting: Internal Medicine

## 2024-05-22 ENCOUNTER — Encounter: Payer: Self-pay | Admitting: Cardiology

## 2024-05-27 ENCOUNTER — Ambulatory Visit: Admitting: Internal Medicine

## 2024-05-28 ENCOUNTER — Other Ambulatory Visit: Payer: Self-pay | Admitting: Internal Medicine

## 2024-05-28 DIAGNOSIS — R7989 Other specified abnormal findings of blood chemistry: Secondary | ICD-10-CM

## 2024-06-04 ENCOUNTER — Ambulatory Visit: Admitting: Internal Medicine

## 2024-06-18 ENCOUNTER — Ambulatory Visit: Admitting: Cardiology
